# Patient Record
Sex: Female | Born: 1965 | Race: Black or African American | Hispanic: No | Smoking: Never smoker
Health system: Southern US, Community
[De-identification: ages and names within clinical notes are randomized; demographics above are authoritative.]

## PROBLEM LIST (undated history)

## (undated) DIAGNOSIS — E785 Hyperlipidemia, unspecified: Secondary | ICD-10-CM

## (undated) DIAGNOSIS — I6381 Other cerebral infarction due to occlusion or stenosis of small artery: Secondary | ICD-10-CM

## (undated) DIAGNOSIS — I639 Cerebral infarction, unspecified: Secondary | ICD-10-CM

## (undated) DIAGNOSIS — E119 Type 2 diabetes mellitus without complications: Secondary | ICD-10-CM

## (undated) HISTORY — DX: Type 2 diabetes mellitus without complications: E11.9

## (undated) HISTORY — DX: Cerebral infarction, unspecified: I63.9

## (undated) HISTORY — DX: Hyperlipidemia, unspecified: E78.5

## (undated) HISTORY — DX: Other cerebral infarction due to occlusion or stenosis of small artery: I63.81

---

## 1995-09-27 HISTORY — PX: GALLBLADDER SURGERY: SHX652

## 1999-11-28 ENCOUNTER — Emergency Department (HOSPITAL_COMMUNITY): Admission: EM | Admit: 1999-11-28 | Discharge: 1999-11-28 | Payer: Self-pay | Admitting: *Deleted

## 2006-01-28 ENCOUNTER — Emergency Department: Payer: Self-pay | Admitting: Internal Medicine

## 2006-08-07 ENCOUNTER — Emergency Department (HOSPITAL_COMMUNITY): Admission: EM | Admit: 2006-08-07 | Discharge: 2006-08-08 | Payer: Self-pay | Admitting: Emergency Medicine

## 2014-08-06 ENCOUNTER — Encounter: Payer: Self-pay | Admitting: Orthopedic Surgery

## 2014-08-26 ENCOUNTER — Encounter: Payer: Self-pay | Admitting: Orthopedic Surgery

## 2015-01-07 ENCOUNTER — Encounter: Payer: Self-pay | Admitting: *Deleted

## 2015-01-12 ENCOUNTER — Ambulatory Visit: Payer: Self-pay | Admitting: Primary Care

## 2015-02-06 ENCOUNTER — Telehealth: Payer: Self-pay | Admitting: Primary Care

## 2015-02-06 ENCOUNTER — Ambulatory Visit: Payer: Self-pay | Admitting: Primary Care

## 2015-02-06 DIAGNOSIS — Z0289 Encounter for other administrative examinations: Secondary | ICD-10-CM

## 2015-02-06 NOTE — Telephone Encounter (Signed)
Patient did not come in for their appointment today for new pt.  Please let me know if patient needs to be contacted immediately for follow up or no follow up needed. °

## 2015-02-06 NOTE — Telephone Encounter (Signed)
Yes, please follow up and reschedule.   Thanks.

## 2015-02-09 NOTE — Telephone Encounter (Signed)
Called to r/s appt, kept ringing, no message mf

## 2015-02-11 NOTE — Telephone Encounter (Signed)
L/m for pt to call back, need to r/s appt

## 2015-02-12 NOTE — Telephone Encounter (Signed)
L/m for pt to call back. mf

## 2015-03-05 ENCOUNTER — Encounter: Payer: Self-pay | Admitting: Primary Care

## 2015-03-05 ENCOUNTER — Ambulatory Visit (INDEPENDENT_AMBULATORY_CARE_PROVIDER_SITE_OTHER): Payer: 59 | Admitting: Primary Care

## 2015-03-05 VITALS — BP 110/64 | HR 110 | Temp 97.6°F | Ht 62.0 in | Wt 157.1 lb

## 2015-03-05 DIAGNOSIS — E119 Type 2 diabetes mellitus without complications: Secondary | ICD-10-CM | POA: Diagnosis not present

## 2015-03-05 DIAGNOSIS — E785 Hyperlipidemia, unspecified: Secondary | ICD-10-CM | POA: Insufficient documentation

## 2015-03-05 NOTE — Assessment & Plan Note (Signed)
Present for 10 years. Managed on Glipizide and Metformin BID but does not take regularly. Fair diet. Sugars ranging 190-240. A1C during physical next visit. Education provided regarding healthy diet.

## 2015-03-05 NOTE — Progress Notes (Signed)
Subjective:    Patient ID: Haley Gomez, female    DOB: 1966-02-27, 49 y.o.   MRN: [id number]  HPI  Haley Gomez is a 49 year old female who presents today to establish care and discuss the problems mentioned below. Her last physical was completed last summer.  1) Type 2 Diabetes: Diagnosed 10 years ago. She is managed on Metformin 1000 mg BID and Glipizide 5 mg twice daily which she doesn't take consistently (twice daily). She checks her sugars once daily at various times (before and after eating) and will range from 190's to 240's. She's never had any low numbers below 60, and never had any highs above 300.   2) Hyperlipidemia: Present for about 10 years. She is not managed on medications. Her diet consists of yogurt, boiled eggs, salad with chicken, fast food. She drinks 100 calorie soda, water, low calorie tea and lemonade. She is also doing "Thrive" which reduces her appetite. She does not regularly exercise. 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  Review of Systems  Constitutional: Negative for fatigue and unexpected weight change.  HENT: Negative for rhinorrhea.   Respiratory: Negative for cough and shortness of breath.   Cardiovascular: Negative for chest pain.  Gastrointestinal: Negative for diarrhea and constipation.  Genitourinary: Negative for dysuria and frequency.  Musculoskeletal: Negative for myalgias and arthralgias.  Skin: Negative for rash.  Allergic/Immunologic: Positive for environmental allergies.  Neurological: Negative for dizziness and headaches.  Psychiatric/Behavioral:       Denies concerns for anxiety or depression       Past Medical History  Diagnosis Date  . Diabetes mellitus without complication   . Hyperlipidemia     History   Social History  . Marital Status: Divorced    Spouse Name: N/A  . Number of Children: N/A  . Years of Education: N/A   Occupational History  . Not on  file.   Social History Main Topics  . Smoking status: Never Smoker   . Smokeless tobacco: Not on file  . Alcohol Use: 0.0 oz/week    0 Standard drinks or equivalent per week     Comment: social  . Drug Use: Not on file  . Sexual Activity: Not on file   Other Topics Concern  . Not on file   Social History Narrative   Single.   3 children.   Works as a Engineer, civil (consulting) at [employer]   Enjoys traveling, spending time with friends, exercising, reading.    Past Surgical History  Procedure Laterality Date  . Gallbladder surgery  1997  . Cesarean section      Family History  Problem Relation Age of Onset  . Diabetes Mother   . Alcohol abuse Father   . Hypertension Father   . Kidney disease Father   . Stroke Father     Allergies  Allergen Reactions  . Bactrim [Sulfamethoxazole-Trimethoprim]     No current outpatient prescriptions on file prior to visit.   No current facility-administered medications on file prior to visit.    BP 110/64 mmHg  Pulse 110  Temp(Src) 97.6 F (36.4 C) (Oral)  Ht  (1.575 m)  Wt 157 lb 1.9 oz (71.269 kg)  BMI 28.73 kg/m2  SpO2 97%    Objective:   Physical Exam  Constitutional: She is oriented to person, place, and time. She appears well-nourished.  HENT:  Right Ear: Ear canal normal.  Left Ear:  Tympanic membrane and ear canal normal.  Mouth/Throat: Oropharynx is clear and moist.  Minimal amount of fluid noted to base of right TM.   Cardiovascular: Normal rate and regular rhythm.   Pulmonary/Chest: Effort normal and breath sounds normal.  Musculoskeletal: Normal range of motion.  Neurological: She is alert and oriented to person, place, and time.  Skin: Skin is warm and dry.  Psychiatric: She has a normal mood and affect.          Assessment & Plan:

## 2015-03-05 NOTE — Progress Notes (Signed)
Pre visit review using our clinic review tool, if applicable. No additional management support is needed unless otherwise documented below in the visit note. 

## 2015-03-05 NOTE — Assessment & Plan Note (Signed)
History of for 10 years. No current meds. Fair diet. Education provided. Lipid panel next visit

## 2015-03-05 NOTE — Patient Instructions (Signed)
It is important that you improve your diet. Please limit carbohydrates in the form of white bread, rice, pasta, cakes, cookies, sugary drinks, etc. Increase your consumption of fresh fruits and vegetables. Be sure to drink plenty of water daily.  Please schedule a physical with me in the next 1-3 months. You will also schedule a lab only appointment one week prior. We will discuss your lab results during your physical.  It was a pleasure to meet you today! Please don't hesitate to call me with any questions. Welcome to [facility]!

## 2015-04-07 ENCOUNTER — Other Ambulatory Visit: Payer: Self-pay | Admitting: Gynecology

## 2015-04-07 DIAGNOSIS — R928 Other abnormal and inconclusive findings on diagnostic imaging of breast: Secondary | ICD-10-CM

## 2015-04-08 ENCOUNTER — Ambulatory Visit
Admission: RE | Admit: 2015-04-08 | Discharge: 2015-04-08 | Disposition: A | Discharge status: Home / Self Care | Payer: 59 | Source: Ambulatory Visit | Attending: Gynecology | Admitting: Gynecology

## 2015-04-08 DIAGNOSIS — R928 Other abnormal and inconclusive findings on diagnostic imaging of breast: Secondary | ICD-10-CM

## 2015-04-14 ENCOUNTER — Other Ambulatory Visit: Payer: Self-pay | Admitting: Gynecology

## 2015-05-29 ENCOUNTER — Other Ambulatory Visit: Payer: Self-pay | Admitting: Primary Care

## 2015-05-29 DIAGNOSIS — E119 Type 2 diabetes mellitus without complications: Secondary | ICD-10-CM

## 2015-05-29 DIAGNOSIS — E785 Hyperlipidemia, unspecified: Secondary | ICD-10-CM

## 2015-05-29 DIAGNOSIS — Z0000 Encounter for general adult medical examination without abnormal findings: Secondary | ICD-10-CM

## 2015-06-04 ENCOUNTER — Other Ambulatory Visit (INDEPENDENT_AMBULATORY_CARE_PROVIDER_SITE_OTHER): Payer: 59

## 2015-06-04 DIAGNOSIS — E785 Hyperlipidemia, unspecified: Secondary | ICD-10-CM | POA: Diagnosis not present

## 2015-06-04 DIAGNOSIS — E119 Type 2 diabetes mellitus without complications: Secondary | ICD-10-CM | POA: Diagnosis not present

## 2015-06-04 DIAGNOSIS — Z0000 Encounter for general adult medical examination without abnormal findings: Secondary | ICD-10-CM | POA: Diagnosis not present

## 2015-06-04 LAB — CBC
HCT: 36.6 % (ref 36.0–46.0)
Hemoglobin: 11.1 g/dL — ABNORMAL LOW (ref 12.0–15.0)
MCHC: 30.3 g/dL (ref 30.0–36.0)
MCV: 70.2 fl — ABNORMAL LOW (ref 78.0–100.0)
Platelets: 416 10*3/uL — ABNORMAL HIGH (ref 150.0–400.0)
RBC: 5.22 Mil/uL — ABNORMAL HIGH (ref 3.87–5.11)
RDW: 28.1 % — ABNORMAL HIGH (ref 11.5–15.5)
WBC: 8.3 10*3/uL (ref 4.0–10.5)

## 2015-06-04 LAB — COMPREHENSIVE METABOLIC PANEL
ALT: 14 U/L (ref 0–35)
AST: 15 U/L (ref 0–37)
Albumin: 4.1 g/dL (ref 3.5–5.2)
Alkaline Phosphatase: 121 U/L — ABNORMAL HIGH (ref 39–117)
BUN: 11 mg/dL (ref 6–23)
CO2: 31 mEq/L (ref 19–32)
Calcium: 10 mg/dL (ref 8.4–10.5)
Chloride: 98 mEq/L (ref 96–112)
Creatinine, Ser: 0.57 mg/dL (ref 0.40–1.20)
GFR: 145.55 mL/min (ref >60.00)
Glucose, Bld: 185 mg/dL — ABNORMAL HIGH (ref 70–99)
Potassium: 4.3 mEq/L (ref 3.5–5.1)
Sodium: 139 mEq/L (ref 135–145)
Total Bilirubin: 0.2 mg/dL (ref 0.2–1.2)
Total Protein: 7.4 g/dL (ref 6.0–8.3)

## 2015-06-04 LAB — LIPID PANEL
Cholesterol: 213 mg/dL — ABNORMAL HIGH (ref 0–200)
HDL: 40.1 mg/dL (ref >39.00)
LDL Cholesterol: 136 mg/dL — ABNORMAL HIGH (ref 0–99)
NonHDL: 173.31
Total CHOL/HDL Ratio: 5
Triglycerides: 188 mg/dL — ABNORMAL HIGH (ref 0.0–149.0)
VLDL: 37.6 mg/dL (ref 0.0–40.0)

## 2015-06-04 LAB — HEMOGLOBIN A1C: Hgb A1c MFr Bld: 7.9 % — ABNORMAL HIGH (ref 4.6–6.5)

## 2015-06-04 LAB — TSH: TSH: 2.63 u[IU]/mL (ref 0.35–4.50)

## 2015-06-09 ENCOUNTER — Telehealth: Payer: Self-pay | Admitting: Primary Care

## 2015-06-09 NOTE — Telephone Encounter (Signed)
I left a message for the patient to return my call.

## 2015-06-09 NOTE — Telephone Encounter (Signed)
Pt left v/m requesting cb. 

## 2015-06-09 NOTE — Telephone Encounter (Signed)
Pt would like c/b w/her lab results. Thank you.

## 2015-06-10 NOTE — Telephone Encounter (Signed)
Called and notified patient that Haley Gomez will discuss labs at upcoming CPE appointment. Patient wanted to know her A1C. I notified patient of that results.

## 2015-06-11 ENCOUNTER — Encounter: Payer: 59 | Admitting: Primary Care

## 2015-06-17 ENCOUNTER — Encounter: Payer: Self-pay | Admitting: Primary Care

## 2015-06-17 ENCOUNTER — Ambulatory Visit (INDEPENDENT_AMBULATORY_CARE_PROVIDER_SITE_OTHER): Payer: 59 | Admitting: Primary Care

## 2015-06-17 VITALS — BP 122/74 | HR 109 | Temp 98.2°F | Ht 62.0 in | Wt 164.8 lb

## 2015-06-17 DIAGNOSIS — E785 Hyperlipidemia, unspecified: Secondary | ICD-10-CM

## 2015-06-17 DIAGNOSIS — Z0000 Encounter for general adult medical examination without abnormal findings: Secondary | ICD-10-CM | POA: Insufficient documentation

## 2015-06-17 DIAGNOSIS — Z0001 Encounter for general adult medical examination with abnormal findings: Secondary | ICD-10-CM | POA: Insufficient documentation

## 2015-06-17 DIAGNOSIS — Z23 Encounter for immunization: Secondary | ICD-10-CM | POA: Diagnosis not present

## 2015-06-17 DIAGNOSIS — R232 Flushing: Secondary | ICD-10-CM | POA: Insufficient documentation

## 2015-06-17 DIAGNOSIS — E119 Type 2 diabetes mellitus without complications: Secondary | ICD-10-CM

## 2015-06-17 DIAGNOSIS — N951 Menopausal and female climacteric states: Secondary | ICD-10-CM | POA: Diagnosis not present

## 2015-06-17 MED ORDER — VENLAFAXINE HCL ER 37.5 MG PO CP24: 37.5000 mg | ORAL_CAPSULE | Freq: Every day | ORAL | Status: DC

## 2015-06-17 MED ORDER — ATORVASTATIN CALCIUM 20 MG PO TABS: 20.0000 mg | ORAL_TABLET | Freq: Every day | ORAL | Status: DC

## 2015-06-17 MED ORDER — GLIPIZIDE ER 10 MG PO TB24: 10.0000 mg | ORAL_TABLET | Freq: Every day | ORAL | Status: DC

## 2015-06-17 NOTE — Assessment & Plan Note (Signed)
Present for the past 2 years with progression in intensity since this summer. Will start low dose Effexor XR today. Will continue to monitor.

## 2015-06-17 NOTE — Progress Notes (Signed)
Pre visit review using our clinic review tool, if applicable. No additional management support is needed unless otherwise documented below in the visit note. 

## 2015-06-17 NOTE — Progress Notes (Signed)
Subjective:    Patient ID: Haley Gomez, female    DOB: 1966-03-30, 49 y.o.   MRN: [id number]  HPI  Haley Gomez is a 49 year old female who presents today for complete physical.  Immunizations: -Tetanus: Completed 2-3 years ago. -Influenza: Completed this season. -Pneumonia: Has never completed.    Diet: Endorses a healthy diet. Breakfast: High fiber oat meal with protein powder, occasional cereal, eggs with toast. Lunch: Soup, salads, sandwiches Dinner: Chicken, steak, vegetables, salads Desserts: Occasional  Snacks: Occasional. Beverages: Mostly water Exercise: She started walking 3-4 miles daily 5 days a week.  Wt Readings from Last 3 Encounters:  06/17/15 164 lb 12.8 oz (74.753 kg)  03/05/15 157 lb 1.9 oz (71.269 kg)     Eye exam: Completed 2 years ago. Is due. Dental exam: Completes semiannually. Pap Smear: Completed in June 2016 with GYN Mammogram: Completed in June 2016  Haley Gomez also presents today with a chief complaint of hot flashes.  1) Hot flashes: Intermittently for the past 2 years. She's having to remove her bed sheets at night frequently. Irregular periods since December 2015. Last period this was in May 2016. She feels as though her body is attempting to adjust to different environmental temperatures. She will "pour sweat" throughout the day. She's never been treated for this in the past.  Review of Systems  Constitutional: Negative for unexpected weight change.  HENT: Negative for rhinorrhea.   Respiratory: Negative for cough and shortness of breath.   Cardiovascular: Negative for chest pain.  Gastrointestinal: Negative for diarrhea and constipation.  Endocrine: Positive for heat intolerance.  Genitourinary: Negative for difficulty urinating.  Musculoskeletal: Negative for myalgias and arthralgias.  Skin: Negative for rash.  Neurological: Negative for dizziness, numbness and headaches.  Psychiatric/Behavioral:       Denies concerns for  anxiety and depression.       Past Medical History  Diagnosis Date  . Diabetes mellitus without complication   . Hyperlipidemia     Social History   Social History  . Marital Status: Divorced    Spouse Name: N/A  . Number of Children: N/A  . Years of Education: N/A   Occupational History  . Not on file.   Social History Main Topics  . Smoking status: Never Smoker   . Smokeless tobacco: Not on file  . Alcohol Use: 0.0 oz/week    0 Standard drinks or equivalent per week     Comment: social  . Drug Use: Not on file  . Sexual Activity: Not on file   Other Topics Concern  . Not on file   Social History Narrative   Single.   3 children.   Works as a Engineer, civil (consulting) at [employer]   Enjoys traveling, spending time with friends, exercising, reading.    Past Surgical History  Procedure Laterality Date  . Gallbladder surgery  1997  . Cesarean section      Family History  Problem Relation Age of Onset  . Diabetes Mother   . Alcohol abuse Father   . Hypertension Father   . Kidney disease Father   . Stroke Father     Allergies  Allergen Reactions  . Bactrim [Sulfamethoxazole-Trimethoprim]     Current Outpatient Prescriptions on File Prior to Visit  Medication Sig Dispense Refill  . metFORMIN (GLUCOPHAGE) 1000 MG tablet Take 1,000 mg by mouth 2 (two) times daily.  6   No current facility-administered medications on file prior to visit.    BP 122/74 mmHg  Pulse 109  Temp(Src) 98.2 F (36.8 C) (Oral)  Ht  (1.575 m)  Wt 164 lb 12.8 oz (74.753 kg)  BMI 30.13 kg/m2  SpO2 98%    Objective:   Physical Exam  Constitutional: She is oriented to person, place, and time. She appears well-nourished.  HENT:  Right Ear: Tympanic membrane and ear canal normal.  Left Ear: Tympanic membrane and ear canal normal.  Nose: Nose normal.  Mouth/Throat: Oropharynx is clear and moist.  Eyes: Conjunctivae and EOM are normal. Pupils are equal, round, and reactive to light.  Neck:  Neck supple. No thyromegaly present.  Cardiovascular: Normal rate and regular rhythm.   Pulmonary/Chest: Effort normal and breath sounds normal.  Abdominal: Soft. Bowel sounds are normal. There is no tenderness.  Musculoskeletal: Normal range of motion.  Lymphadenopathy:    She has no cervical adenopathy.  Neurological: She is alert and oriented to person, place, and time. She has normal reflexes. No cranial nerve deficit.  Skin: Skin is warm and dry.  Psychiatric: She has a normal mood and affect.          Assessment & Plan:

## 2015-06-17 NOTE — Assessment & Plan Note (Signed)
Tetanus and flu UTD, prevnar 13 provided today. Pap and mammogram UTD. Recommended annual diabetic eye exam. Discussed the importance of healthy diet and exercise. Labs with hyperlipidemia and increased A1C. Exam unremarkable. Repeat physical in 1 year.

## 2015-06-17 NOTE — Patient Instructions (Signed)
Start Glipizide XL 10 mg tablets for diabetes. Take 1 tablet by mouth once daily in the morning. Continue Metformin.  Schedule a lab appointment for a urine microalbumin test at your convenience.  Start atorvastatin (lipitor) 20 mg tablets for high cholesterol. Take 1 tablet by mouth daily.  Start venlafaxine (effexor) ER for hot flashes. Take 1 tablet by mouth daily.  You've been provided with a pneumonia vaccination.   Please schedule a follow up appointment in 3 months for re-evaluation of diabetes.  It was a pleasure to see you today!

## 2015-06-17 NOTE — Assessment & Plan Note (Signed)
LDL, TC, and Trigs above goal. She's worked to improve her diet over the past several months. Due to DM type 2 will initiate lipitor 20 mg daily. LFT's WNL. Discussed possible side effects. Will repeat in 6 months to 1 year.

## 2015-06-17 NOTE — Assessment & Plan Note (Signed)
A1C of 7.9 which is worse than prior A1C  Currently managed on Metformin 1000 mg BID and Glipizide 5 mg BID. Endorses fair diet with daily exercise. Change to Glipizide XL 10 mg daily, continue metformin. Foot exam completed today. Initiated statin due to hyperlipidemia. Urine microalbumin pending. Follow up in 3 months.

## 2015-07-17 ENCOUNTER — Telehealth: Payer: Self-pay | Admitting: Primary Care

## 2015-07-17 NOTE — Telephone Encounter (Signed)
Tried to call patient but could not leave message due mailbox full.

## 2015-07-17 NOTE — Telephone Encounter (Signed)
Will you please ask Haley Gomez how she's doing since starting the Effexor for hot flashes? Thanks.

## 2015-07-20 NOTE — Telephone Encounter (Signed)
Tried to call patient but could not leave message due mailbox full. 

## 2015-07-20 NOTE — Telephone Encounter (Signed)
Message left for patient to return my call.  

## 2015-07-20 NOTE — Telephone Encounter (Signed)
Patient returned Chan's call.  Patient can be reached at [phone].

## 2015-07-21 NOTE — Telephone Encounter (Signed)
Sending letter asking patient regarding Kate's comments.

## 2015-08-13 ENCOUNTER — Other Ambulatory Visit: Payer: Self-pay | Admitting: Gynecology

## 2015-08-13 DIAGNOSIS — N63 Unspecified lump in unspecified breast: Secondary | ICD-10-CM

## 2015-09-18 ENCOUNTER — Other Ambulatory Visit: Payer: 59

## 2015-09-25 ENCOUNTER — Ambulatory Visit: Payer: 59 | Admitting: Primary Care

## 2015-09-25 ENCOUNTER — Other Ambulatory Visit: Payer: 59

## 2015-10-02 ENCOUNTER — Telehealth: Payer: Self-pay | Admitting: Primary Care

## 2015-10-02 ENCOUNTER — Ambulatory Visit: Payer: 59 | Admitting: Primary Care

## 2015-10-02 NOTE — Telephone Encounter (Signed)
Pt did not come in for their appt today for follow up. Please let me know if pt needs to be contacted immediately for follow up or no follow up needed. Best phone number to contact pt is [phone].

## 2015-10-02 NOTE — Telephone Encounter (Signed)
Needs follow up soon, please schedule at her convenience. Please do not charge no show fee. Thanks.

## 2015-10-05 NOTE — Telephone Encounter (Signed)
Cancelled appt per Jae DireKate, no charge.  Left message for pt to call back and reschedule appt.

## 2016-04-07 ENCOUNTER — Other Ambulatory Visit: Payer: Self-pay | Admitting: Primary Care

## 2018-02-10 ENCOUNTER — Emergency Department (HOSPITAL_COMMUNITY): Payer: BLUE CROSS/BLUE SHIELD

## 2018-02-10 ENCOUNTER — Encounter (HOSPITAL_COMMUNITY): Payer: Self-pay | Admitting: Emergency Medicine

## 2018-02-10 ENCOUNTER — Emergency Department (HOSPITAL_COMMUNITY)
Admission: EM | Admit: 2018-02-10 | Discharge: 2018-02-11 | Disposition: A | Discharge status: Home / Self Care | Payer: BLUE CROSS/BLUE SHIELD | Attending: Emergency Medicine | Admitting: Emergency Medicine

## 2018-02-10 DIAGNOSIS — R1110 Vomiting, unspecified: Secondary | ICD-10-CM | POA: Insufficient documentation

## 2018-02-10 DIAGNOSIS — E1165 Type 2 diabetes mellitus with hyperglycemia: Secondary | ICD-10-CM | POA: Insufficient documentation

## 2018-02-10 DIAGNOSIS — R42 Dizziness and giddiness: Secondary | ICD-10-CM | POA: Diagnosis present

## 2018-02-10 DIAGNOSIS — R739 Hyperglycemia, unspecified: Secondary | ICD-10-CM

## 2018-02-10 DIAGNOSIS — R202 Paresthesia of skin: Secondary | ICD-10-CM | POA: Insufficient documentation

## 2018-02-10 DIAGNOSIS — Z79899 Other long term (current) drug therapy: Secondary | ICD-10-CM | POA: Diagnosis not present

## 2018-02-10 LAB — BASIC METABOLIC PANEL
Anion gap: 14 (ref 5–15)
BUN: 10 mg/dL (ref 6–20)
CO2: 21 mmol/L — ABNORMAL LOW (ref 22–32)
Calcium: 9.2 mg/dL (ref 8.9–10.3)
Chloride: 99 mmol/L — ABNORMAL LOW (ref 101–111)
Creatinine, Ser: 0.51 mg/dL (ref 0.44–1.00)
GFR calc Af Amer: >60 mL/min (ref >60)
GFR calc non Af Amer: >60 mL/min (ref >60)
Glucose, Bld: 332 mg/dL — ABNORMAL HIGH (ref 65–99)
Potassium: 3.9 mmol/L (ref 3.5–5.1)
Sodium: 134 mmol/L — ABNORMAL LOW (ref 135–145)

## 2018-02-10 LAB — I-STAT BETA HCG BLOOD, ED (MC, WL, AP ONLY): I-stat hCG, quantitative: <5 m[IU]/mL (ref <5)

## 2018-02-10 LAB — CBC
HCT: 37.3 % (ref 36.0–46.0)
Hemoglobin: 12.3 g/dL (ref 12.0–15.0)
MCH: 25.9 pg — ABNORMAL LOW (ref 26.0–34.0)
MCHC: 33 g/dL (ref 30.0–36.0)
MCV: 78.7 fL (ref 78.0–100.0)
Platelets: 313 10*3/uL (ref 150–400)
RBC: 4.74 MIL/uL (ref 3.87–5.11)
RDW: 14.3 % (ref 11.5–15.5)
WBC: 14.3 10*3/uL — ABNORMAL HIGH (ref 4.0–10.5)

## 2018-02-10 LAB — I-STAT TROPONIN, ED: Troponin i, poc: 0 ng/mL (ref 0.00–0.08)

## 2018-02-10 LAB — CBG MONITORING, ED
Glucose-Capillary: 292 mg/dL — ABNORMAL HIGH (ref 65–99)
Glucose-Capillary: 301 mg/dL — ABNORMAL HIGH (ref 65–99)

## 2018-02-10 MED ORDER — GADOBENATE DIMEGLUMINE 529 MG/ML IV SOLN
20.0000 mL | Freq: Once | INTRAVENOUS | Status: AC | PRN
Start: 2018-02-10 — End: 2018-02-10
  Administered 2018-02-10: 16 mL via INTRAVENOUS

## 2018-02-10 MED ORDER — MECLIZINE HCL 25 MG PO TABS
50.0000 mg | ORAL_TABLET | Freq: Once | ORAL | Status: AC
  Administered 2018-02-10: 50 mg via ORAL
  Filled 2018-02-10: qty 2

## 2018-02-10 MED ORDER — SODIUM CHLORIDE 0.9 % IV BOLUS
1000.0000 mL | Freq: Once | INTRAVENOUS | Status: AC
  Administered 2018-02-10: 1000 mL via INTRAVENOUS

## 2018-02-10 MED ORDER — MECLIZINE HCL 25 MG PO TABS: 25.0000 mg | ORAL_TABLET | Freq: Three times a day (TID) | ORAL | 0 refills | Status: DC | PRN

## 2018-02-10 NOTE — ED Notes (Addendum)
Pt. Made aware for the need of urine specimen. 

## 2018-02-10 NOTE — ED Provider Notes (Signed)
[facility] Provider Note   CSN: [id number] Arrival date & time: 02/10/18  1841     History   Chief Complaint Chief Complaint  Patient presents with  . facial numbness  . Dizziness    HPI Haley Gomez is a 52 y.o. female.  HPI  52 year old female presents with facial numbness as well as dizziness.  She states that around 1 PM she started feeling numbness to the top part of her lip bilaterally.  Then after couple minutes this proceeded to go into her left cheek and she is been noticing some tingling in her fingertips on the left side.  No weakness or slurred speech.  No headache.  Shortly after this she is developed dizziness to the point that whenever she tries to stand up or get up she is feeling very off balance and has a hard time walking.  Twice she has vomited because of the severity of dizziness.  Feels like things are spinning.  There is no blurry vision, chest pain, or shortness of breath.  Past Medical History:  Diagnosis Date  . Diabetes mellitus without complication (HCC)   . Hyperlipidemia     Patient Active Problem List   Diagnosis Date Noted  . Hot flashes 06/17/2015  . Preventative health care 06/17/2015  . Type 2 diabetes mellitus (HCC) 03/05/2015  . Hyperlipidemia 03/05/2015    Past Surgical History:  Procedure Laterality Date  . CESAREAN SECTION    . GALLBLADDER SURGERY  1997     OB History   None      Home Medications    Prior to Admission medications   Medication Sig Start Date End Date Taking? Authorizing Provider  atorvastatin (LIPITOR) 20 MG tablet Take 1 tablet (20 mg total) by mouth daily. 06/17/15  Yes Doreene Nest, NP  diphenhydrAMINE (BENADRYL) 25 MG tablet Take 25 mg by mouth every 6 (six) hours as needed for sleep.   Yes [provider]  glipiZIDE (GLUCOTROL XL) 10 MG 24 hr tablet TAKE 1 TABLET BY MOUTH EVERY DAY WITH BREAKFAST   NEED OFFICE VISIT FOR FURTHER REFILLS 04/07/16  Yes  Doreene Nest, NP  metFORMIN (GLUCOPHAGE) 1000 MG tablet Take 1,000 mg by mouth 2 (two) times daily. 01/16/15  Yes [provider]  meclizine (ANTIVERT) 25 MG tablet Take 1 tablet (25 mg total) by mouth 3 (three) times daily as needed for dizziness. 02/10/18   Pricilla Loveless, MD  venlafaxine XR (EFFEXOR XR) 37.5 MG 24 hr capsule Take 1 capsule (37.5 mg total) by mouth daily with breakfast. Patient not taking: Reported on 02/10/2018 06/17/15   Doreene Nest, NP    Family History Family History  Problem Relation Age of Onset  . Diabetes Mother   . Alcohol abuse Father   . Hypertension Father   . Kidney disease Father   . Stroke Father     Social History Social History   Tobacco Use  . Smoking status: Never Smoker  . Smokeless tobacco: Never Used  Substance Use Topics  . Alcohol use: Yes    Alcohol/week: 0.0 oz    Comment: social  . Drug use: Not on file     Allergies   Bactrim [sulfamethoxazole-trimethoprim]   Review of Systems Review of Systems  Eyes: Negative for visual disturbance.  Respiratory: Negative for shortness of breath.   Cardiovascular: Negative for chest pain.  Gastrointestinal: Positive for vomiting.  Neurological: Positive for dizziness and numbness. Negative for speech difficulty, weakness  and headaches.  All other systems reviewed and are negative.    Physical Exam Updated Vital Signs BP 120/64   Pulse 85   Temp 97.9 F (36.6 C)   Resp 18   Ht 5\' 2"  (1.575 m)   Wt 74.8 kg (165 lb)   SpO2 99%   BMI 30.18 kg/m   Physical Exam  Constitutional: She is oriented to person, place, and time. She appears well-developed and well-nourished. No distress.  HENT:  Head: Normocephalic and atraumatic.  Right Ear: External ear normal.  Left Ear: External ear normal.  Nose: Nose normal.  Eyes: Pupils are equal, round, and reactive to light. EOM are normal. Right eye exhibits no discharge. Left eye exhibits no discharge.  Cardiovascular:  Normal rate, regular rhythm and normal heart sounds.  Pulmonary/Chest: Effort normal and breath sounds normal.  Abdominal: Soft. There is no tenderness.  Neurological: She is alert and oriented to person, place, and time.  CN 3-12 grossly intact. 5/5 strength in all 4 extremities. Grossly normal sensation. Normal finger to nose. Able to walk in straight line, feels dizzy but does not appear ataxic.  Skin: Skin is warm and dry. She is not diaphoretic.  Nursing note and vitals reviewed.    ED Treatments / Results  Labs (all labs ordered are listed, but only abnormal results are displayed) Labs Reviewed  BASIC METABOLIC PANEL - Abnormal; Notable for the following components:      Result Value   Sodium 134 (*)    Chloride 99 (*)    CO2 21 (*)    Glucose, Bld 332 (*)    All other components within normal limits  CBC - Abnormal; Notable for the following components:   WBC 14.3 (*)    MCH 25.9 (*)    All other components within normal limits  CBG MONITORING, ED - Abnormal; Notable for the following components:   Glucose-Capillary 292 (*)    All other components within normal limits  CBG MONITORING, ED - Abnormal; Notable for the following components:   Glucose-Capillary 301 (*)    All other components within normal limits  URINALYSIS, ROUTINE W REFLEX MICROSCOPIC  RAPID URINE DRUG SCREEN, HOSP PERFORMED  I-STAT BETA HCG BLOOD, ED ([facility], [facility], [facility] ONLY)  I-STAT TROPONIN, ED    EKG EKG Interpretation  Date/Time:  Saturday Feb 10 2018 18:50:01 EDT Ventricular Rate:  87 PR Interval:    QRS Duration: 66 QT Interval:  368 QTC Calculation: 443 R Axis:   47 Text Interpretation:  Normal sinus rhythm Low voltage, precordial leads Baseline wander in lead(s) III V1 no significant change since 2001 Confirmed by Pricilla Loveless [phone]) on 02/10/2018 7:52:21 PM   Radiology Ct Head Wo Contrast  Result Date: 02/10/2018 CLINICAL DATA:  Ataxia EXAM: CT HEAD WITHOUT CONTRAST TECHNIQUE: Contiguous  axial images were obtained from the base of the skull through the vertex without intravenous contrast. COMPARISON:  None. FINDINGS: Brain: No acute intracranial abnormality. Specifically, no hemorrhage, hydrocephalus, mass lesion, acute infarction, or significant intracranial injury. Vascular: No hyperdense vessel or unexpected calcification. Skull: No acute calvarial abnormality. Sinuses/Orbits: Visualized paranasal sinuses and mastoids clear. Orbital soft tissues unremarkable. Other: None IMPRESSION: No acute intracranial abnormality. Electronically Signed   By: Charlett Nose M.D.   On: 02/10/2018 20:26   Mr Angiogram Head Wo Contrast  Result Date: 02/10/2018 CLINICAL DATA:  Initial evaluation for acute dizziness, left-sided facial numbness. EXAM: MRI HEAD WITHOUT CONTRAST MRA HEAD WITHOUT CONTRAST MRA NECK WITHOUT AND WITH CONTRAST  TECHNIQUE: Multiplanar, multiecho pulse sequences of the brain and surrounding structures were obtained without intravenous contrast. Angiographic images of the Circle of Willis were obtained using MRA technique without intravenous contrast. Angiographic images of the neck were obtained using MRA technique without and with intravenous contrast. Carotid stenosis measurements (when applicable) are obtained utilizing NASCET criteria, using the distal internal carotid diameter as the denominator. CONTRAST:  16mL MULTIHANCE GADOBENATE DIMEGLUMINE 529 MG/ML IV SOLN COMPARISON:  Prior CT from earlier the same day. FINDINGS: MRI HEAD FINDINGS Cerebral volume within normal limits. No significant cerebral white matter change for age. No definite abnormal foci of restricted diffusion to suggest acute or subacute ischemia. Minimal vague diffusion abnormality overlying the brainstem on axial DWI sequence image 12 favored to be artifactual in nature. Gray-white matter differentiation well maintained. No encephalomalacia to suggest chronic infarction. No foci of susceptibility artifact to suggest  acute or chronic intracranial hemorrhage. No mass lesion, midline shift or mass effect. Ventricles normal in size without hydrocephalus. No extra-axial fluid collection. Major dural sinuses are grossly patent. Pituitary gland suprasellar region normal. Midline structures intact and normal. Major intracranial vascular flow voids are maintained. Craniocervical junction normal. Upper cervical spine normal. Bone marrow signal intensity within normal limits. No scalp soft tissue abnormality. Globes and orbital soft tissues within normal limits. Paranasal sinuses are clear. Trace opacity left mastoid air cells, of doubtful significance. Mastoid air cells otherwise clear. Inner ear structures normal. MRA HEAD FINDINGS ANTERIOR CIRCULATION: Distal cervical segments of the internal carotid arteries are widely patent. Petrous, cavernous, and supraclinoid segments widely patent bilaterally. ICA termini widely patent. A1 segments, anterior communicating artery common anterior cerebral arteries widely patent to their distal aspects. M1 segments widely patent bilaterally. Normal MCA bifurcations. No proximal M2 occlusion. Distal MCA branches well perfused and symmetric. POSTERIOR CIRCULATION: Vertebral arteries widely patent to the vertebrobasilar junction. Partially visualized posterior inferior cerebral arteries patent bilaterally. Basilar artery patent to its distal aspect without stenosis. Superior cerebral arteries patent bilaterally. Both of the PCAs primarily supplied via the basilar. Mild-to-moderate narrowing of the proximal and mid right P2 segment (series 602, image 6). PCAs otherwise mildly irregular but patent to their distal aspects without stenosis. No aneurysm. MRA NECK FINDINGS Source images reviewed. Examination technically limited by motion artifact. Visualized aortic arch of normal caliber with normal branch pattern. No flow-limiting stenosis about the origin of the great vessels. Visualized subclavian  arteries patent. Right common carotid artery widely patent from its origin to the bifurcation. Mild approximate 25% at the origin of the right ICA. Right ICA otherwise patent distally to the skull base without stenosis or occlusion. Left common carotid artery patent from its origin to the bifurcation without stenosis. No significant narrowing about the left bifurcation. Left ICA patent distally to the skull base without stenosis or occlusion. Evaluation of the vertebral artery somewhat limited due to adjacent venous contamination. Vertebral arteries both arise from the subclavian arteries. Vertebral arteries are grossly patent within the neck without stenosis or occlusion. Left vertebral artery appears to be slightly dominant. IMPRESSION: MRI HEAD IMPRESSION Negative brain MRI for age. No acute intracranial abnormality identified. MRA HEAD IMPRESSION 1. Negative intracranial MRA with no large vessel occlusion or proximal high-grade/correctable stenosis identified. 2. Mild to moderate multifocal right P2 stenoses. MRA NECK IMPRESSION 1. Negative MRA of the neck for high-grade or critical flow limiting stenosis. 2. Mild narrowing of approximately 25% at the origin of the right ICA. 3. Widely patent left carotid artery system without flow-limiting stenosis.  4. Widely patent vertebral arteries within the neck. Electronically Signed   By: Rise Mu M.D.   On: 02/10/2018 23:36   Mr Maxine Glenn Neck W Wo Contrast  Result Date: 02/10/2018 CLINICAL DATA:  Initial evaluation for acute dizziness, left-sided facial numbness. EXAM: MRI HEAD WITHOUT CONTRAST MRA HEAD WITHOUT CONTRAST MRA NECK WITHOUT AND WITH CONTRAST TECHNIQUE: Multiplanar, multiecho pulse sequences of the brain and surrounding structures were obtained without intravenous contrast. Angiographic images of the Circle of Willis were obtained using MRA technique without intravenous contrast. Angiographic images of the neck were obtained using MRA technique  without and with intravenous contrast. Carotid stenosis measurements (when applicable) are obtained utilizing NASCET criteria, using the distal internal carotid diameter as the denominator. CONTRAST:  16mL MULTIHANCE GADOBENATE DIMEGLUMINE 529 MG/ML IV SOLN COMPARISON:  Prior CT from earlier the same day. FINDINGS: MRI HEAD FINDINGS Cerebral volume within normal limits. No significant cerebral white matter change for age. No definite abnormal foci of restricted diffusion to suggest acute or subacute ischemia. Minimal vague diffusion abnormality overlying the brainstem on axial DWI sequence image 12 favored to be artifactual in nature. Gray-white matter differentiation well maintained. No encephalomalacia to suggest chronic infarction. No foci of susceptibility artifact to suggest acute or chronic intracranial hemorrhage. No mass lesion, midline shift or mass effect. Ventricles normal in size without hydrocephalus. No extra-axial fluid collection. Major dural sinuses are grossly patent. Pituitary gland suprasellar region normal. Midline structures intact and normal. Major intracranial vascular flow voids are maintained. Craniocervical junction normal. Upper cervical spine normal. Bone marrow signal intensity within normal limits. No scalp soft tissue abnormality. Globes and orbital soft tissues within normal limits. Paranasal sinuses are clear. Trace opacity left mastoid air cells, of doubtful significance. Mastoid air cells otherwise clear. Inner ear structures normal. MRA HEAD FINDINGS ANTERIOR CIRCULATION: Distal cervical segments of the internal carotid arteries are widely patent. Petrous, cavernous, and supraclinoid segments widely patent bilaterally. ICA termini widely patent. A1 segments, anterior communicating artery common anterior cerebral arteries widely patent to their distal aspects. M1 segments widely patent bilaterally. Normal MCA bifurcations. No proximal M2 occlusion. Distal MCA branches well  perfused and symmetric. POSTERIOR CIRCULATION: Vertebral arteries widely patent to the vertebrobasilar junction. Partially visualized posterior inferior cerebral arteries patent bilaterally. Basilar artery patent to its distal aspect without stenosis. Superior cerebral arteries patent bilaterally. Both of the PCAs primarily supplied via the basilar. Mild-to-moderate narrowing of the proximal and mid right P2 segment (series 602, image 6). PCAs otherwise mildly irregular but patent to their distal aspects without stenosis. No aneurysm. MRA NECK FINDINGS Source images reviewed. Examination technically limited by motion artifact. Visualized aortic arch of normal caliber with normal branch pattern. No flow-limiting stenosis about the origin of the great vessels. Visualized subclavian arteries patent. Right common carotid artery widely patent from its origin to the bifurcation. Mild approximate 25% at the origin of the right ICA. Right ICA otherwise patent distally to the skull base without stenosis or occlusion. Left common carotid artery patent from its origin to the bifurcation without stenosis. No significant narrowing about the left bifurcation. Left ICA patent distally to the skull base without stenosis or occlusion. Evaluation of the vertebral artery somewhat limited due to adjacent venous contamination. Vertebral arteries both arise from the subclavian arteries. Vertebral arteries are grossly patent within the neck without stenosis or occlusion. Left vertebral artery appears to be slightly dominant. IMPRESSION: MRI HEAD IMPRESSION Negative brain MRI for age. No acute intracranial abnormality identified. MRA HEAD IMPRESSION 1.  Negative intracranial MRA with no large vessel occlusion or proximal high-grade/correctable stenosis identified. 2. Mild to moderate multifocal right P2 stenoses. MRA NECK IMPRESSION 1. Negative MRA of the neck for high-grade or critical flow limiting stenosis. 2. Mild narrowing of  approximately 25% at the origin of the right ICA. 3. Widely patent left carotid artery system without flow-limiting stenosis. 4. Widely patent vertebral arteries within the neck. Electronically Signed   By: Rise Mu M.D.   On: 02/10/2018 23:36   Mr Brain Wo Contrast  Result Date: 02/10/2018 CLINICAL DATA:  Initial evaluation for acute dizziness, left-sided facial numbness. EXAM: MRI HEAD WITHOUT CONTRAST MRA HEAD WITHOUT CONTRAST MRA NECK WITHOUT AND WITH CONTRAST TECHNIQUE: Multiplanar, multiecho pulse sequences of the brain and surrounding structures were obtained without intravenous contrast. Angiographic images of the Circle of Willis were obtained using MRA technique without intravenous contrast. Angiographic images of the neck were obtained using MRA technique without and with intravenous contrast. Carotid stenosis measurements (when applicable) are obtained utilizing NASCET criteria, using the distal internal carotid diameter as the denominator. CONTRAST:  16mL MULTIHANCE GADOBENATE DIMEGLUMINE 529 MG/ML IV SOLN COMPARISON:  Prior CT from earlier the same day. FINDINGS: MRI HEAD FINDINGS Cerebral volume within normal limits. No significant cerebral white matter change for age. No definite abnormal foci of restricted diffusion to suggest acute or subacute ischemia. Minimal vague diffusion abnormality overlying the brainstem on axial DWI sequence image 12 favored to be artifactual in nature. Gray-white matter differentiation well maintained. No encephalomalacia to suggest chronic infarction. No foci of susceptibility artifact to suggest acute or chronic intracranial hemorrhage. No mass lesion, midline shift or mass effect. Ventricles normal in size without hydrocephalus. No extra-axial fluid collection. Major dural sinuses are grossly patent. Pituitary gland suprasellar region normal. Midline structures intact and normal. Major intracranial vascular flow voids are maintained. Craniocervical  junction normal. Upper cervical spine normal. Bone marrow signal intensity within normal limits. No scalp soft tissue abnormality. Globes and orbital soft tissues within normal limits. Paranasal sinuses are clear. Trace opacity left mastoid air cells, of doubtful significance. Mastoid air cells otherwise clear. Inner ear structures normal. MRA HEAD FINDINGS ANTERIOR CIRCULATION: Distal cervical segments of the internal carotid arteries are widely patent. Petrous, cavernous, and supraclinoid segments widely patent bilaterally. ICA termini widely patent. A1 segments, anterior communicating artery common anterior cerebral arteries widely patent to their distal aspects. M1 segments widely patent bilaterally. Normal MCA bifurcations. No proximal M2 occlusion. Distal MCA branches well perfused and symmetric. POSTERIOR CIRCULATION: Vertebral arteries widely patent to the vertebrobasilar junction. Partially visualized posterior inferior cerebral arteries patent bilaterally. Basilar artery patent to its distal aspect without stenosis. Superior cerebral arteries patent bilaterally. Both of the PCAs primarily supplied via the basilar. Mild-to-moderate narrowing of the proximal and mid right P2 segment (series 602, image 6). PCAs otherwise mildly irregular but patent to their distal aspects without stenosis. No aneurysm. MRA NECK FINDINGS Source images reviewed. Examination technically limited by motion artifact. Visualized aortic arch of normal caliber with normal branch pattern. No flow-limiting stenosis about the origin of the great vessels. Visualized subclavian arteries patent. Right common carotid artery widely patent from its origin to the bifurcation. Mild approximate 25% at the origin of the right ICA. Right ICA otherwise patent distally to the skull base without stenosis or occlusion. Left common carotid artery patent from its origin to the bifurcation without stenosis. No significant narrowing about the left  bifurcation. Left ICA patent distally to the skull base without stenosis or  occlusion. Evaluation of the vertebral artery somewhat limited due to adjacent venous contamination. Vertebral arteries both arise from the subclavian arteries. Vertebral arteries are grossly patent within the neck without stenosis or occlusion. Left vertebral artery appears to be slightly dominant. IMPRESSION: MRI HEAD IMPRESSION Negative brain MRI for age. No acute intracranial abnormality identified. MRA HEAD IMPRESSION 1. Negative intracranial MRA with no large vessel occlusion or proximal high-grade/correctable stenosis identified. 2. Mild to moderate multifocal right P2 stenoses. MRA NECK IMPRESSION 1. Negative MRA of the neck for high-grade or critical flow limiting stenosis. 2. Mild narrowing of approximately 25% at the origin of the right ICA. 3. Widely patent left carotid artery system without flow-limiting stenosis. 4. Widely patent vertebral arteries within the neck. Electronically Signed   By: Rise Mu M.D.   On: 02/10/2018 23:36    Procedures Procedures (including critical care time)  Medications Ordered in ED Medications  sodium chloride 0.9 % bolus 1,000 mL (0 mLs Intravenous Stopped 02/10/18 2310)  meclizine (ANTIVERT) tablet 50 mg (50 mg Oral Given 02/10/18 2053)  gadobenate dimeglumine (MULTIHANCE) injection 20 mL (16 mLs Intravenous Contrast Given 02/10/18 2206)     Initial Impression / Assessment and Plan / ED Course  I have reviewed the triage vital signs and the nursing notes.  Pertinent labs & imaging results that were available during my care of the patient were reviewed by me and considered in my medical decision making (see chart for details).     The patient's dizziness is probably from vertigo.  She is had continuous dizziness since onset but she is able to ambulate without significant difficulty.  She is feeling much better with Antivert.  Given her history of diabetes as well as  having the paresthesias, MRI obtained.  However this MRI is negative.  The paresthesias are odd as it crosses the midline above her lip and is more of a tingling than a true numbness.  Thus I think stroke or TIA is unlikely.  She feels much better with Antivert and appears stable for discharge home.  We discussed that her hyperglycemia needs outpatient management with her PCP.  Anion gap is 14, not consistent with DKA.  She was given fluids.  Discharge home with return precautions.  Final Clinical Impressions(s) / ED Diagnoses   Final diagnoses:  Vertigo  Paresthesia  Hyperglycemia    ED Discharge Orders        Ordered    meclizine (ANTIVERT) 25 MG tablet  3 times daily PRN     02/10/18 2344       Pricilla Loveless, MD 02/10/18 2348

## 2018-02-10 NOTE — Discharge Instructions (Addendum)
Follow-up closely with your primary care physician for follow-up of your visit today as well as your elevated blood glucose.  If you develop headache, weakness in your arms or legs, or new numbness, return to the ER for evaluation.  Otherwise follow-up closely with your doctor.

## 2018-02-10 NOTE — ED Notes (Signed)
Pt. Made aware for the need of urine specimen. 

## 2018-02-10 NOTE — ED Triage Notes (Signed)
Pt reports around 1pm today at work she got numbness across her top lip then spread to left side of face. Pt reports her left arm felt tingling as well.  Pt felt dizzy and nauseated. Reports on Thursday she had a severe left sided head pain that last maybe a minute and went away. Pt has no neuro deficits in triage at this time. Pt reports she vomit before she left the house

## 2018-12-19 ENCOUNTER — Encounter (HOSPITAL_COMMUNITY): Payer: Self-pay | Admitting: Emergency Medicine

## 2018-12-19 ENCOUNTER — Emergency Department (HOSPITAL_COMMUNITY): Payer: Managed Care, Other (non HMO)

## 2018-12-19 ENCOUNTER — Encounter (HOSPITAL_COMMUNITY): Payer: Self-pay

## 2018-12-19 ENCOUNTER — Inpatient Hospital Stay (HOSPITAL_COMMUNITY)
Admission: EM | Admit: 2018-12-19 | Discharge: 2018-12-21 | DRG: 066 | Disposition: A | Discharge status: Home / Self Care | Payer: Managed Care, Other (non HMO) | Attending: Internal Medicine | Admitting: Internal Medicine

## 2018-12-19 ENCOUNTER — Ambulatory Visit (HOSPITAL_COMMUNITY)
Admission: EM | Admit: 2018-12-19 | Discharge: 2018-12-19 | Disposition: A | Discharge status: Home / Self Care | Payer: Managed Care, Other (non HMO) | Source: Home / Self Care | Attending: Family Medicine | Admitting: Family Medicine

## 2018-12-19 ENCOUNTER — Other Ambulatory Visit: Payer: Self-pay

## 2018-12-19 DIAGNOSIS — E1159 Type 2 diabetes mellitus with other circulatory complications: Secondary | ICD-10-CM

## 2018-12-19 DIAGNOSIS — Z8249 Family history of ischemic heart disease and other diseases of the circulatory system: Secondary | ICD-10-CM | POA: Diagnosis not present

## 2018-12-19 DIAGNOSIS — R29701 NIHSS score 1: Secondary | ICD-10-CM | POA: Diagnosis present

## 2018-12-19 DIAGNOSIS — Z833 Family history of diabetes mellitus: Secondary | ICD-10-CM

## 2018-12-19 DIAGNOSIS — E785 Hyperlipidemia, unspecified: Secondary | ICD-10-CM | POA: Diagnosis present

## 2018-12-19 DIAGNOSIS — E1165 Type 2 diabetes mellitus with hyperglycemia: Secondary | ICD-10-CM | POA: Diagnosis present

## 2018-12-19 DIAGNOSIS — E1151 Type 2 diabetes mellitus with diabetic peripheral angiopathy without gangrene: Secondary | ICD-10-CM | POA: Diagnosis present

## 2018-12-19 DIAGNOSIS — Z79899 Other long term (current) drug therapy: Secondary | ICD-10-CM | POA: Diagnosis not present

## 2018-12-19 DIAGNOSIS — R200 Anesthesia of skin: Secondary | ICD-10-CM

## 2018-12-19 DIAGNOSIS — I639 Cerebral infarction, unspecified: Principal | ICD-10-CM | POA: Diagnosis present

## 2018-12-19 DIAGNOSIS — D72829 Elevated white blood cell count, unspecified: Secondary | ICD-10-CM | POA: Diagnosis present

## 2018-12-19 DIAGNOSIS — R42 Dizziness and giddiness: Secondary | ICD-10-CM | POA: Diagnosis not present

## 2018-12-19 DIAGNOSIS — Z8673 Personal history of transient ischemic attack (TIA), and cerebral infarction without residual deficits: Secondary | ICD-10-CM | POA: Diagnosis present

## 2018-12-19 DIAGNOSIS — Z823 Family history of stroke: Secondary | ICD-10-CM

## 2018-12-19 DIAGNOSIS — I6381 Other cerebral infarction due to occlusion or stenosis of small artery: Secondary | ICD-10-CM

## 2018-12-19 DIAGNOSIS — I10 Essential (primary) hypertension: Secondary | ICD-10-CM | POA: Diagnosis present

## 2018-12-19 DIAGNOSIS — Z7984 Long term (current) use of oral hypoglycemic drugs: Secondary | ICD-10-CM | POA: Diagnosis not present

## 2018-12-19 DIAGNOSIS — I6350 Cerebral infarction due to unspecified occlusion or stenosis of unspecified cerebral artery: Secondary | ICD-10-CM | POA: Diagnosis not present

## 2018-12-19 DIAGNOSIS — E119 Type 2 diabetes mellitus without complications: Secondary | ICD-10-CM

## 2018-12-19 DIAGNOSIS — R208 Other disturbances of skin sensation: Secondary | ICD-10-CM

## 2018-12-19 LAB — CBC WITH DIFFERENTIAL/PLATELET
Abs Immature Granulocytes: 0.06 10*3/uL (ref 0.00–0.07)
Basophils Absolute: 0.1 10*3/uL (ref 0.0–0.1)
Basophils Relative: 0 %
Eosinophils Absolute: 0 10*3/uL (ref 0.0–0.5)
Eosinophils Relative: 0 %
HCT: 43.4 % (ref 36.0–46.0)
Hemoglobin: 13.2 g/dL (ref 12.0–15.0)
Immature Granulocytes: 1 %
Lymphocytes Relative: 10 %
Lymphs Abs: 1.3 10*3/uL (ref 0.7–4.0)
MCH: 25 pg — ABNORMAL LOW (ref 26.0–34.0)
MCHC: 30.4 g/dL (ref 30.0–36.0)
MCV: 82.2 fL (ref 80.0–100.0)
Monocytes Absolute: 0.3 10*3/uL (ref 0.1–1.0)
Monocytes Relative: 2 %
Neutro Abs: 10.7 10*3/uL — ABNORMAL HIGH (ref 1.7–7.7)
Neutrophils Relative %: 87 %
Platelets: 397 10*3/uL (ref 150–400)
RBC: 5.28 MIL/uL — ABNORMAL HIGH (ref 3.87–5.11)
RDW: 14.3 % (ref 11.5–15.5)
WBC: 12.4 10*3/uL — ABNORMAL HIGH (ref 4.0–10.5)
nRBC: 0 % (ref 0.0–0.2)

## 2018-12-19 LAB — BASIC METABOLIC PANEL
Anion gap: 13 (ref 5–15)
BUN: 12 mg/dL (ref 6–20)
CO2: 22 mmol/L (ref 22–32)
Calcium: 9.4 mg/dL (ref 8.9–10.3)
Chloride: 100 mmol/L (ref 98–111)
Creatinine, Ser: 0.44 mg/dL (ref 0.44–1.00)
GFR calc Af Amer: >60 mL/min (ref >60)
GFR calc non Af Amer: >60 mL/min (ref >60)
Glucose, Bld: 300 mg/dL — ABNORMAL HIGH (ref 70–99)
Potassium: 4.1 mmol/L (ref 3.5–5.1)
Sodium: 135 mmol/L (ref 135–145)

## 2018-12-19 MED ORDER — ASPIRIN 81 MG PO CHEW
324.0000 mg | CHEWABLE_TABLET | Freq: Once | ORAL | Status: AC
  Administered 2018-12-19: 324 mg via ORAL
  Filled 2018-12-19: qty 4

## 2018-12-19 MED ORDER — INSULIN ASPART 100 UNIT/ML ~~LOC~~ SOLN
0.0000 [IU] | Freq: Every day | SUBCUTANEOUS | Status: DC
  Administered 2018-12-20: 4 [IU] via SUBCUTANEOUS
  Filled 2018-12-19: qty 1

## 2018-12-19 MED ORDER — CLOPIDOGREL BISULFATE 300 MG PO TABS
300.0000 mg | ORAL_TABLET | Freq: Once | ORAL | Status: AC
  Administered 2018-12-19: 300 mg via ORAL
  Filled 2018-12-19: qty 1

## 2018-12-19 MED ORDER — INSULIN ASPART 100 UNIT/ML ~~LOC~~ SOLN
0.0000 [IU] | Freq: Three times a day (TID) | SUBCUTANEOUS | Status: DC
  Administered 2018-12-20: 8 [IU] via SUBCUTANEOUS
  Administered 2018-12-20: 5 [IU] via SUBCUTANEOUS
  Administered 2018-12-20: 8 [IU] via SUBCUTANEOUS
  Administered 2018-12-21: 3 [IU] via SUBCUTANEOUS

## 2018-12-19 NOTE — ED Triage Notes (Addendum)
Pt reports L sided tingling and numbness that started yesterday, She states that the tingling is on the left side of her face, her L hand, and her left lower leg. She also reports some positional dizziness. She also reports back pain and states that she has been sitting a lot more because she is working from home. She is concerned for a pinched nerve.

## 2018-12-19 NOTE — ED Notes (Signed)
Attempted 2 straight sticks, right hand and right ac, but patient moved on both attempts.

## 2018-12-19 NOTE — ED Notes (Signed)
ED TO INPATIENT HANDOFF REPORT  ED Nurse Name and Phone #: Florentina Addison, RN  S Name/Age/Gender Haley Gomez 53 y.o. female Room/Bed: WA16/WA16  Code Status   Code Status: Not on file  Home/SNF/Other Home Patient oriented to: AOx4 Is this baseline? Yes   Triage Complete: Triage complete  Chief Complaint Left side numbness  Triage Note Pt reports L sided tingling and numbness that started yesterday, She states that the tingling is on the left side of her face, her L hand, and her left lower leg. She also reports some positional dizziness. She also reports back pain and states that she has been sitting a lot more because she is working from home. She is concerned for a pinched nerve.   Allergies Allergies  Allergen Reactions  . Bactrim [Sulfamethoxazole-Trimethoprim] Nausea Only    Level of Care/Admitting Diagnosis ED Disposition    ED Disposition Condition Comment   Admit  Hospital Area: [facility] [[id number]]  Level of Care: Telemetry Medical [104]  Diagnosis: Right thalamic infarction [facility]) [[id number]]  Admitting Physician: Rometta Emery [[id number]]  Attending Physician: Rometta Emery [[id number]]  Estimated length of stay: past midnight tomorrow  Certification:: I certify this patient will need inpatient services for at least 2 midnights  PT Class (Do Not Modify): Inpatient [101]  PT Acc Code (Do Not Modify): Private [1]       B Medical/Surgery History Past Medical History:  Diagnosis Date  . Diabetes mellitus without complication (HCC)   . Hyperlipidemia    Past Surgical History:  Procedure Laterality Date  . CESAREAN SECTION    . GALLBLADDER SURGERY  1997     A IV Location/Drains/Wounds Patient Lines/Drains/Airways Status   Active Line/Drains/Airways    Name:   Placement date:   Placement time:   Site:   Days:   Peripheral IV 12/19/18 Left Antecubital   12/19/18    2324    Antecubital   less than 1          Intake/Output Last 24  hours No intake or output data in the 24 hours ending 12/19/18 2342  Labs/Imaging Results for orders placed or performed during the hospital encounter of 12/19/18 (from the past 48 hour(s))  CBC with Differential     Status: Abnormal   Collection Time: 12/19/18  9:04 PM  Result Value Ref Range   WBC 12.4 (H) 4.0 - 10.5 K/uL   RBC 5.28 (H) 3.87 - 5.11 MIL/uL   Hemoglobin 13.2 12.0 - 15.0 g/dL   HCT 06.8 16.6 - 19.6 %   MCV 82.2 80.0 - 100.0 fL   MCH 25.0 (L) 26.0 - 34.0 pg   MCHC 30.4 30.0 - 36.0 g/dL   RDW 94.0 98.2 - 86.7 %   Platelets 397 150 - 400 K/uL   nRBC 0.0 0.0 - 0.2 %   Neutrophils Relative % 87 %   Neutro Abs 10.7 (H) 1.7 - 7.7 K/uL   Lymphocytes Relative 10 %   Lymphs Abs 1.3 0.7 - 4.0 K/uL   Monocytes Relative 2 %   Monocytes Absolute 0.3 0.1 - 1.0 K/uL   Eosinophils Relative 0 %   Eosinophils Absolute 0.0 0.0 - 0.5 K/uL   Basophils Relative 0 %   Basophils Absolute 0.1 0.0 - 0.1 K/uL   Immature Granulocytes 1 %   Abs Immature Granulocytes 0.06 0.00 - 0.07 K/uL    Comment: Performed at [facility], [address], [place], [place] [address]  Basic  metabolic panel     Status: Abnormal   Collection Time: 12/19/18  9:04 PM  Result Value Ref Range   Sodium 135 135 - 145 mmol/L   Potassium 4.1 3.5 - 5.1 mmol/L   Chloride 100 98 - 111 mmol/L   CO2 22 22 - 32 mmol/L   Glucose, Bld 300 (H) 70 - 99 mg/dL   BUN 12 6 - 20 mg/dL   Creatinine, Ser 2.72 0.44 - 1.00 mg/dL   Calcium 9.4 8.9 - 53.6 mg/dL   GFR calc non Af Amer >60 >60 mL/min   GFR calc Af Amer >60 >60 mL/min   Anion gap 13 5 - 15    Comment: Performed at [facility], [address] [place], [place] [address]   Mr Brain Wo Contrast  Result Date: 12/19/2018 CLINICAL DATA:  Left-sided tingling and numbness EXAM: MRI HEAD WITHOUT CONTRAST TECHNIQUE: Multiplanar, multiecho pulse sequences of the brain and surrounding structures were obtained without intravenous  contrast. COMPARISON:  Brain MRI 02/10/2018 FINDINGS: BRAIN: Small acute infarct of the dorsolateral right thalamus. The midline structures are normal. No midline shift or other mass effect. The white matter signal is normal for the patient's age. The cerebral and cerebellar volume are age-appropriate. No hydrocephalus. Susceptibility-sensitive sequences show no chronic microhemorrhage or superficial siderosis. No mass lesion. VASCULAR: The major intracranial arterial and venous sinus flow voids are normal. SKULL AND UPPER CERVICAL SPINE: Calvarial bone marrow signal is normal. There is no skull base mass. Visualized upper cervical spine and soft tissues are normal. SINUSES/ORBITS: No fluid levels or advanced mucosal thickening. No mastoid or middle ear effusion. The orbits are normal. IMPRESSION: 1. Small acute infarct of the dorsolateral right thalamus, in close proximity to the posterior limb of the internal capsule, in keeping with the reported left-sided symptoms. 2. Otherwise normal MRI of the brain. Electronically Signed   By: Deatra Robinson M.D.   On: 12/19/2018 22:18    Pending Labs [employer] (From admission, onward)    Start     Ordered   Signed and Held  HIV antibody (Routine Testing)  Once,   R     Signed and Held   Signed and Held  Hemoglobin A1c  Tomorrow morning,   R     Signed and Held   Signed and Held  Lipid panel  Tomorrow morning,   R    Comments:  Fasting    Signed and Held   Signed and Held  CBC  (enoxaparin (LOVENOX)    CrCl >/= 30 ml/min)  Once,   R    Comments:  Baseline for enoxaparin therapy IF NOT ALREADY DRAWN.  Notify MD if PLT < 100 K.    Signed and Held   Signed and Held  Creatinine, serum  (enoxaparin (LOVENOX)    CrCl >/= 30 ml/min)  Once,   R    Comments:  Baseline for enoxaparin therapy IF NOT ALREADY DRAWN.    Signed and Held   Signed and Held  Creatinine, serum  (enoxaparin (LOVENOX)    CrCl >/= 30 ml/min)  Weekly,   R    Comments:  while on  enoxaparin therapy    Signed and Held          Vitals/Pain Today's Vitals   12/19/18 2014 12/19/18 2025 12/19/18 2321 12/19/18 2322  BP: (!) 155/87  124/88   Pulse: (!) 101  92   Resp: 18  17   Temp: 98.2 F (36.8 C)  TempSrc: Oral     SpO2: 99%  95% 95%  Weight: 73.9 kg     Height: 5\' 2"  (1.575 m)     PainSc:  7       Isolation Precautions No active isolations  Medications Medications  insulin aspart (novoLOG) injection 0-15 Units (has no administration in time range)  insulin aspart (novoLOG) injection 0-5 Units (has no administration in time range)  aspirin chewable tablet 324 mg (324 mg Oral Given 12/19/18 2311)  clopidogrel (PLAVIX) tablet 300 mg (300 mg Oral Given 12/19/18 2312)    Mobility walks Low fall risk   Focused Assessments Cardiac Assessment Handoff:    No results found for: CKTOTAL, CKMB, CKMBINDEX, TROPONINI No results found for: DDIMER Does the Patient currently have chest pain? No      R Recommendations: See Admitting Provider Note  Report given to:   Additional Notes: N/A

## 2018-12-19 NOTE — ED Triage Notes (Addendum)
Numbness in left hand and forearm, no numbness in upper arm.  Outside of left leg is numb, inner leg is not numb.   Onset yesterday of symptoms.  Symptoms went away after lying down.  This morning, no symptoms.  After he day started going, symptoms reoccurred.   Random back pain throughout the past few days  Tingling above lip.  Patient reports similar episode last year and says referred to as syncope.

## 2018-12-19 NOTE — Discharge Instructions (Signed)
Recommending further evaluation and management in the ED for worsening numbness and dizziness that began 1 day ago.  Patient aware and in agreement with this plan.

## 2018-12-19 NOTE — H&P (Signed)
History and Physical   Haley CushingMichelle G Gomez ZOX:[id number] DOB: 03-29-1966 DOA: 12/19/2018  Referring MD/NP/PA: Dr. Juleen ChinaKohut  PCP: Doreene Nestlark, Katherine K, NP   Patient coming from: Home  Chief Complaint: Left-sided numbness  HPI: Haley CushingMichelle G Brave is a 53 y.o. female with medical history significant of diabetes, hyperlipidemia presenting with left-sided numbness that started a day ago.  Numbness is localized to the upper lip left hand and forearm.  Also on the outside of the left foot.  It initially was about 30 minutes then went to hours.  It went away but came back today and has stayed.  Patient tried several things at home.  She thought it was going away but does not go away so she came to the ER.  She had similar episode last year when she was diagnosed with vertigo.  She had CT or MRI at that time that were negative.  Patient treated with meclizine and got better.  She denied any focal weakness.  Denied any dizziness this time around.  No nausea vomiting or diarrhea.  Patient had MRI done that showed small right thalamic infarct and so she is being admitted for work-up..  ED Course: Vitals showed blood pressure 155/87 with pulse of 102.  White count is 12.4 otherwise chemistry and CBC appear to be normal.  Blood glucose is 300.  MRI of the brain showed right thalamic infarct.  EKG has not been done.  Review of Systems: As per HPI otherwise 10 point review of systems negative.    Past Medical History:  Diagnosis Date  . Diabetes mellitus without complication (HCC)   . Hyperlipidemia     Past Surgical History:  Procedure Laterality Date  . CESAREAN SECTION    . GALLBLADDER SURGERY  1997     reports that she has never smoked. She has never used smokeless tobacco. She reports current alcohol use. She reports that she does not use drugs.  Allergies  Allergen Reactions  . Bactrim [Sulfamethoxazole-Trimethoprim] Nausea Only    Family History  Problem Relation Age of Onset  . Diabetes  Mother   . Alcohol abuse Father   . Hypertension Father   . Kidney disease Father   . Stroke Father      Prior to Admission medications   Medication Sig Start Date End Date Taking? Authorizing Provider  amphetamine-dextroamphetamine (ADDERALL) 10 MG tablet Take 10 mg by mouth 2 (two) times daily. 11/26/18  Yes [provider]  atorvastatin (LIPITOR) 20 MG tablet Take 1 tablet (20 mg total) by mouth daily. 06/17/15  Yes Doreene Nestlark, Katherine K, NP  diphenhydrAMINE (BENADRYL) 25 MG tablet Take 25 mg by mouth every 6 (six) hours as needed for sleep.   Yes [provider]  glipiZIDE (GLUCOTROL XL) 10 MG 24 hr tablet TAKE 1 TABLET BY MOUTH EVERY DAY WITH BREAKFAST   NEED OFFICE VISIT FOR FURTHER REFILLS 04/07/16  Yes Doreene Nestlark, Katherine K, NP  metFORMIN (GLUCOPHAGE) 1000 MG tablet Take 1,000 mg by mouth 2 (two) times daily. 01/16/15  Yes [provider]    Physical Exam: Vitals:   12/19/18 2014  BP: (!) 155/87  Pulse: (!) 101  Resp: 18  Temp: 98.2 F (36.8 C)  TempSrc: Oral  SpO2: 99%  Weight: 73.9 kg  Height: 5\' 2"  (1.575 m)      Constitutional: NAD, calm, comfortable Vitals:   12/19/18 2014  BP: (!) 155/87  Pulse: (!) 101  Resp: 18  Temp: 98.2 F (36.8 C)  TempSrc: Oral  SpO2: 99%  Weight: 73.9 kg  Height: 5\' 2"  (1.575 m)   Eyes: PERRL, lids and conjunctivae normal ENMT: Mucous membranes are moist. Posterior pharynx clear of any exudate or lesions.Normal dentition.  Neck: normal, supple, no masses, no thyromegaly Respiratory: clear to auscultation bilaterally, no wheezing, no crackles. Normal respiratory effort. No accessory muscle use.  Cardiovascular: Regular rate and rhythm, no murmurs / rubs / gallops. No extremity edema. 2+ pedal pulses. No carotid bruits.  Abdomen: no tenderness, no masses palpated. No hepatosplenomegaly. Bowel sounds positive.  Musculoskeletal: no clubbing / cyanosis. No joint deformity upper and lower extremities. Good ROM, no  contractures. Normal muscle tone.  Skin: no rashes, lesions, ulcers. No induration Neurologic: CN 2-12 grossly intact. Sensation intact, DTR normal. Strength 5/5 in all 4.  Decreased sensation in the upper and lower extremities laterally with left facial decreased sensation Psychiatric: Normal judgment and insight. Alert and oriented x 3. Normal mood.     Labs on Admission: I have personally reviewed following labs and imaging studies  CBC: Recent Labs  Lab 12/19/18 2104  WBC 12.4*  NEUTROABS 10.7*  HGB 13.2  HCT 43.4  MCV 82.2  PLT 397   Basic Metabolic Panel: Recent Labs  Lab 12/19/18 2104  NA 135  K 4.1  CL 100  CO2 22  GLUCOSE 300*  BUN 12  CREATININE 0.44  CALCIUM 9.4   GFR: Estimated Creatinine Clearance: 78.3 mL/min (by C-G formula based on SCr of 0.44 mg/dL). Liver Function Tests: No results for input(s): AST, ALT, ALKPHOS, BILITOT, PROT, ALBUMIN in the last 168 hours. No results for input(s): LIPASE, AMYLASE in the last 168 hours. No results for input(s): AMMONIA in the last 168 hours. Coagulation Profile: No results for input(s): INR, PROTIME in the last 168 hours. Cardiac Enzymes: No results for input(s): CKTOTAL, CKMB, CKMBINDEX, TROPONINI in the last 168 hours. BNP (last 3 results) No results for input(s): PROBNP in the last 8760 hours. HbA1C: No results for input(s): HGBA1C in the last 72 hours. CBG: No results for input(s): GLUCAP in the last 168 hours. Lipid Profile: No results for input(s): CHOL, HDL, LDLCALC, TRIG, CHOLHDL, LDLDIRECT in the last 72 hours. Thyroid Function Tests: No results for input(s): TSH, T4TOTAL, FREET4, T3FREE, THYROIDAB in the last 72 hours. Anemia Panel: No results for input(s): VITAMINB12, FOLATE, FERRITIN, TIBC, IRON, RETICCTPCT in the last 72 hours. Urine analysis: No results found for: COLORURINE, APPEARANCEUR, LABSPEC, PHURINE, GLUCOSEU, HGBUR, BILIRUBINUR, KETONESUR, PROTEINUR, UROBILINOGEN, NITRITE, LEUKOCYTESUR  Sepsis Labs: @LABRCNTIP (procalcitonin:4,lacticidven:4) )No results found for this or any previous visit (from the past 240 hour(s)).   Radiological Exams on Admission: Mr Brain Wo Contrast  Result Date: 12/19/2018 CLINICAL DATA:  Left-sided tingling and numbness EXAM: MRI HEAD WITHOUT CONTRAST TECHNIQUE: Multiplanar, multiecho pulse sequences of the brain and surrounding structures were obtained without intravenous contrast. COMPARISON:  Brain MRI 02/10/2018 FINDINGS: BRAIN: Small acute infarct of the dorsolateral right thalamus. The midline structures are normal. No midline shift or other mass effect. The white matter signal is normal for the patient's age. The cerebral and cerebellar volume are age-appropriate. No hydrocephalus. Susceptibility-sensitive sequences show no chronic microhemorrhage or superficial siderosis. No mass lesion. VASCULAR: The major intracranial arterial and venous sinus flow voids are normal. SKULL AND UPPER CERVICAL SPINE: Calvarial bone marrow signal is normal. There is no skull base mass. Visualized upper cervical spine and soft tissues are normal. SINUSES/ORBITS: No fluid levels or advanced mucosal thickening. No mastoid or middle ear effusion. The orbits are  normal. IMPRESSION: 1. Small acute infarct of the dorsolateral right thalamus, in close proximity to the posterior limb of the internal capsule, in keeping with the reported left-sided symptoms. 2. Otherwise normal MRI of the brain. Electronically Signed   By: Deatra Robinson M.D.   On: 12/19/2018 22:18    EKG: Independently reviewed.  EKG is currently pending  Assessment/Plan Principal Problem:   Right thalamic infarction [facility]) Active Problems:   Type 2 diabetes mellitus (HCC)   Hyperlipidemia   Leucocytosis     #1 right thalamic infarct: Patient will be [facility] to be followed up by neurology.  MRI has been done.  Check carotid Dopplers with echocardiogram.  Aspirin and Plavix  initiated.  PT OT and speech therapy consultation.  Neurology will follow up with the patient.  Check EKG to rule out A. fib  #2 diabetes: Initiate sliding scale insulin with glycemic control.  She is on oral hypoglycemics that we will hold for now.  #3 hyperlipidemia: Patient on Lipitor but only 20 mg.  I will place her on 40 mg daily.  #4 leukocytosis: Cause not quite clear.  Could be related to her ongoing symptoms.  Continue close monitoring.  DVT prophylaxis: Lovenox Code Status: Full code Family Communication: Discussed care with patient Disposition Plan: To be determined Consults called: Dr. Jerrell Belfast neurology Admission status: Inpatient  Severity of Illness: The appropriate patient status for this patient is INPATIENT. Inpatient status is judged to be reasonable and necessary in order to provide the required intensity of service to ensure the patient's safety. The patient's presenting symptoms, physical exam findings, and initial radiographic and laboratory data in the context of their chronic comorbidities is felt to place them at high risk for further clinical deterioration. Furthermore, it is not anticipated that the patient will be medically stable for discharge from the hospital within 2 midnights of admission. The following factors support the patient status of inpatient.   " The patient's presenting symptoms include left-sided numbness. " The worrisome physical exam findings include no focal weakness. " The initial radiographic and laboratory data are worrisome because of MRI showing acute infarct. " The chronic co-morbidities include diabetes with hyperlipidemia.   * I certify that at the point of admission it is my clinical judgment that the patient will require inpatient hospital care spanning beyond 2 midnights from the point of admission due to high intensity of service, high risk for further deterioration and high frequency of surveillance required.Lonia Blood MD  [facility] Pager [phone]  If 7PM-7AM, please contact night-coverage [email or web address] Password [facility]  12/19/2018, 11:19 PM

## 2018-12-19 NOTE — ED Provider Notes (Signed)
[facility]   [id number] 12/19/18 Arrival Time: 1827  CC: Left sided numbness  SUBJECTIVE:  Haley Gomez is a 53 y.o. female hx significant for DM and HLD, who complains of intermittent numbness that began 1 day ago.  Denies a precipitating event, or recent head trauma.  Localizes numbness to upper lip, left hand and forearm, and outside of left foot. Was intermittent and lasted approximately 30 minutes to 1-2 hours yesterday. Now constant today.  Patient has tried resting with temporary relief. Symptoms are made worse with sitting at her desk at home for work.  Reports similar symptoms last year and diagnosed with vertigo.  Treated with meclizine with relief.  Had negative CT and MRI at that time. Also mentions associated dizziness like the she is on a boat.  Patient denies HA, fever, chills, vision changes, facial asymmetries, slurred speech, incoordination, changes in memory, nausea, vomiting, chest pain, SOB, abdominal pain, weakness, tingling.    Family hx significant for stroke in father at age 24  ROS: As per HPI.  Past Medical History:  Diagnosis Date  . Diabetes mellitus without complication (HCC)   . Hyperlipidemia    Past Surgical History:  Procedure Laterality Date  . CESAREAN SECTION    . GALLBLADDER SURGERY  1997   Allergies  Allergen Reactions  . Bactrim [Sulfamethoxazole-Trimethoprim]    No current facility-administered medications on file prior to encounter.    Current Outpatient Medications on File Prior to Encounter  Medication Sig Dispense Refill  . atorvastatin (LIPITOR) 20 MG tablet Take 1 tablet (20 mg total) by mouth daily. 90 tablet 3  . glipiZIDE (GLUCOTROL XL) 10 MG 24 hr tablet TAKE 1 TABLET BY MOUTH EVERY DAY WITH BREAKFAST   NEED OFFICE VISIT FOR FURTHER REFILLS 30 tablet 0  . metFORMIN (GLUCOPHAGE) 1000 MG tablet Take 1,000 mg by mouth 2 (two) times daily.  6  . diphenhydrAMINE (BENADRYL) 25 MG tablet Take 25 mg by mouth every 6 (six)  hours as needed for sleep.     Social History   Socioeconomic History  . Marital status: Divorced    Spouse name: Not on file  . Number of children: Not on file  . Years of education: Not on file  . Highest education level: Not on file  Occupational History  . Not on file  Social Needs  . Financial resource strain: Not on file  . Food insecurity:    Worry: Not on file    Inability: Not on file  . Transportation needs:    Medical: Not on file    Non-medical: Not on file  Tobacco Use  . Smoking status: Never Smoker  . Smokeless tobacco: Never Used  Substance and Sexual Activity  . Alcohol use: Yes    Alcohol/week: 0.0 standard drinks    Comment: social  . Drug use: Never  . Sexual activity: Not on file  Lifestyle  . Physical activity:    Days per week: Not on file    Minutes per session: Not on file  . Stress: Not on file  Relationships  . Social connections:    Talks on phone: Not on file    Gets together: Not on file    Attends religious service: Not on file    Active member of club or organization: Not on file    Attends meetings of clubs or organizations: Not on file    Relationship status: Not on file  . Intimate partner violence:    Fear  of current or ex partner: Not on file    Emotionally abused: Not on file    Physically abused: Not on file    Forced sexual activity: Not on file  Other Topics Concern  . Not on file  Social History Narrative   Single.   3 children.   Works as a Engineer, civil (consulting) at [employer]   Enjoys traveling, spending time with friends, exercising, reading.   Family History  Problem Relation Age of Onset  . Diabetes Mother   . Alcohol abuse Father   . Hypertension Father   . Kidney disease Father   . Stroke Father     OBJECTIVE:  Vitals:   12/19/18 1858  BP: 121/83  Pulse: (!) 102  Resp: 18  Temp: 98.1 F (36.7 C)  TempSrc: Oral  SpO2: 97%    General appearance: Alert and oriented to person, place, and upcoming holiday; no distress  Eyes: PERRLA; EOMI HENT: normocephalic; atraumatic Neck: supple with FROM Lungs: clear to auscultation bilaterally Heart: regular rate and rhythm.  Radial pulses 2+ symmetrical bilaterally Extremities: no edema; symmetrical with no gross deformities Skin: warm and dry Neurologic: CN 2-12 grossly intact; finger to nose without difficulty; negative pronator drift; RAM without difficulty; normal gait; 2+ symmetric patellar reflexes; strength intact bilaterally about the upper and lower extremities; decreased sensation about left hand, and lateral aspect of left foot Psychological: alert and cooperative; normal mood and affect  ASSESSMENT & PLAN:  1. Numbness of left hand   2. Numbness of left foot   3. Dizziness    Recommending further evaluation and management in the ED for worsening numbness and dizziness that began 1 day ago.  Patient aware and in agreement with this plan.     Rennis Harding, PA-C 12/19/18 1958

## 2018-12-19 NOTE — ED Provider Notes (Signed)
[facility]-EMERGENCY DEPT Provider Note   CSN: [id number] Arrival date & time: 12/19/18  2010    History   Chief Complaint Chief Complaint  Patient presents with  . Tingling  . Back Pain    HPI TIAHNA SHIERS is a 53 y.o. female.     HPI   53 year old female with left-sided numbness.  First noticed around 815.  She woke up earlier in the morning with no symptoms.  She has had numbness in the left side of her face, her left hand up into the dorsum of the left forearm and over the lateral aspect of her left shin and foot.  No pain.  No weakness.  No acute visual changes.  No nausea.  No problems with balance or coordination.  She describes some intermittent "dizziness" but she states that this is been ongoing issue for her.  Past Medical History:  Diagnosis Date  . Diabetes mellitus without complication (HCC)   . Hyperlipidemia     Patient Active Problem List   Diagnosis Date Noted  . Hot flashes 06/17/2015  . Preventative health care 06/17/2015  . Type 2 diabetes mellitus (HCC) 03/05/2015  . Hyperlipidemia 03/05/2015    Past Surgical History:  Procedure Laterality Date  . CESAREAN SECTION    . GALLBLADDER SURGERY  1997     OB History   No obstetric history on file.      Home Medications    Prior to Admission medications   Medication Sig Start Date End Date Taking? Authorizing Provider  atorvastatin (LIPITOR) 20 MG tablet Take 1 tablet (20 mg total) by mouth daily. 06/17/15   Doreene Nest, NP  diphenhydrAMINE (BENADRYL) 25 MG tablet Take 25 mg by mouth every 6 (six) hours as needed for sleep.    [provider]  glipiZIDE (GLUCOTROL XL) 10 MG 24 hr tablet TAKE 1 TABLET BY MOUTH EVERY DAY WITH BREAKFAST   NEED OFFICE VISIT FOR FURTHER REFILLS 04/07/16   Doreene Nest, NP  metFORMIN (GLUCOPHAGE) 1000 MG tablet Take 1,000 mg by mouth 2 (two) times daily. 01/16/15   [provider]    Family History Family  History  Problem Relation Age of Onset  . Diabetes Mother   . Alcohol abuse Father   . Hypertension Father   . Kidney disease Father   . Stroke Father     Social History Social History   Tobacco Use  . Smoking status: Never Smoker  . Smokeless tobacco: Never Used  Substance Use Topics  . Alcohol use: Yes    Alcohol/week: 0.0 standard drinks    Comment: social  . Drug use: Never     Allergies   Bactrim [sulfamethoxazole-trimethoprim]   Review of Systems Review of Systems All systems reviewed and negative, other than as noted in HPI.   Physical Exam Updated Vital Signs BP (!) 155/87 (BP Location: Left Arm)   Pulse (!) 101   Temp 98.2 F (36.8 C) (Oral)   Resp 18   Ht 5\' 2"  (1.575 m)   Wt 73.9 kg   SpO2 99%   BMI 29.81 kg/m   Physical Exam Vitals signs and nursing note reviewed.  Constitutional:      General: She is not in acute distress.    Appearance: She is well-developed.  HENT:     Head: Normocephalic and atraumatic.  Eyes:     General:        Right eye: No discharge.  Left eye: No discharge.     Conjunctiva/sclera: Conjunctivae normal.  Neck:     Musculoskeletal: Neck supple.  Cardiovascular:     Rate and Rhythm: Normal rate and regular rhythm.     Heart sounds: Normal heart sounds. No murmur. No friction rub. No gallop.   Pulmonary:     Effort: Pulmonary effort is normal. No respiratory distress.     Breath sounds: Normal breath sounds.  Abdominal:     General: There is no distension.     Palpations: Abdomen is soft.     Tenderness: There is no abdominal tenderness.  Musculoskeletal:        General: No tenderness.  Skin:    General: Skin is warm and dry.  Neurological:     Mental Status: She is alert. She is disoriented.     Cranial Nerves: No cranial nerve deficit.     Motor: No weakness.     Coordination: Coordination normal.     Comments: Sensation intact to light touch in the face.  There is decreased sensation to light touch  in the left hand over the dorsum of the left forearm and along the ulnar aspect of the forearm to just below the elbow.  Decreased sensation to light touch along the left lateral lower leg into the dorsal lateral aspect of the foot.  Psychiatric:        Behavior: Behavior normal.        Thought Content: Thought content normal.      ED Treatments / Results  Labs (all labs ordered are listed, but only abnormal results are displayed) Labs Reviewed  CBC WITH DIFFERENTIAL/PLATELET - Abnormal; Notable for the following components:      Result Value   WBC 12.4 (*)    RBC 5.28 (*)    MCH 25.0 (*)    Neutro Abs 10.7 (*)    All other components within normal limits  BASIC METABOLIC PANEL - Abnormal; Notable for the following components:   Glucose, Bld 300 (*)    All other components within normal limits  HEMOGLOBIN A1C - Abnormal; Notable for the following components:   Hgb A1c MFr Bld 12.5 (*)    All other components within normal limits  LIPID PANEL - Abnormal; Notable for the following components:   Cholesterol 249 (*)    HDL 40 (*)    LDL Cholesterol 199 (*)    All other components within normal limits  CBC - Abnormal; Notable for the following components:   WBC 11.0 (*)    MCV 77.8 (*)    MCH 24.9 (*)    All other components within normal limits  GLUCOSE, CAPILLARY - Abnormal; Notable for the following components:   Glucose-Capillary 259 (*)    All other components within normal limits  GLUCOSE, CAPILLARY - Abnormal; Notable for the following components:   Glucose-Capillary 266 (*)    All other components within normal limits  GLUCOSE, CAPILLARY - Abnormal; Notable for the following components:   Glucose-Capillary 218 (*)    All other components within normal limits  CBG MONITORING, ED - Abnormal; Notable for the following components:   Glucose-Capillary 316 (*)    All other components within normal limits  HIV ANTIBODY (ROUTINE TESTING W REFLEX)  CREATININE, SERUM  RAPID  URINE DRUG SCREEN, HOSP PERFORMED    EKG None  Radiology Mr Brain Wo Contrast  Result Date: 12/19/2018 CLINICAL DATA:  Left-sided tingling and numbness EXAM: MRI HEAD WITHOUT CONTRAST TECHNIQUE: Multiplanar, multiecho pulse  sequences of the brain and surrounding structures were obtained without intravenous contrast. COMPARISON:  Brain MRI 02/10/2018 FINDINGS: BRAIN: Small acute infarct of the dorsolateral right thalamus. The midline structures are normal. No midline shift or other mass effect. The white matter signal is normal for the patient's age. The cerebral and cerebellar volume are age-appropriate. No hydrocephalus. Susceptibility-sensitive sequences show no chronic microhemorrhage or superficial siderosis. No mass lesion. VASCULAR: The major intracranial arterial and venous sinus flow voids are normal. SKULL AND UPPER CERVICAL SPINE: Calvarial bone marrow signal is normal. There is no skull base mass. Visualized upper cervical spine and soft tissues are normal. SINUSES/ORBITS: No fluid levels or advanced mucosal thickening. No mastoid or middle ear effusion. The orbits are normal. IMPRESSION: 1. Small acute infarct of the dorsolateral right thalamus, in close proximity to the posterior limb of the internal capsule, in keeping with the reported left-sided symptoms. 2. Otherwise normal MRI of the brain. Electronically Signed   By: Deatra Robinson M.D.   On: 12/19/2018 22:18    Procedures Procedures (including critical care time)  Medications Ordered in ED Medications - No data to display   Initial Impression / Assessment and Plan / ED Course  I have reviewed the triage vital signs and the nursing notes.  Pertinent labs & imaging results that were available during my care of the patient were reviewed by me and considered in my medical decision making (see chart for details).   53 year old female with left-sided numbness.  Exam is significant for decreased sensorium.  Otherwise nonfocal.   Mild symptoms and well outside of window for TPA with last normal around 8 am this morning.  MRI did show an acute right thalamic stroke.  Aspirin given.  Will consult neurology.  Admission for further evaluation.  Discussed with Dr Laurence Slate, neurology. Transfer [facility]. Also wanted plavix load. Will discuss with medicine for admission.   Final Clinical Impressions(s) / ED Diagnoses   Final diagnoses:  Cerebrovascular accident (CVA), unspecified mechanism [facility])    ED Discharge Orders    None       Raeford Razor, MD 12/20/18 2013

## 2018-12-19 NOTE — ED Notes (Signed)
Gave pt phone and asked she answer it when it rings bc pharm need to reconcile her meds.

## 2018-12-19 NOTE — ED Notes (Signed)
Patient transported to MRI 

## 2018-12-20 ENCOUNTER — Inpatient Hospital Stay (HOSPITAL_COMMUNITY): Payer: Managed Care, Other (non HMO)

## 2018-12-20 ENCOUNTER — Telehealth: Payer: Self-pay | Admitting: Primary Care

## 2018-12-20 DIAGNOSIS — I6350 Cerebral infarction due to unspecified occlusion or stenosis of unspecified cerebral artery: Secondary | ICD-10-CM

## 2018-12-20 DIAGNOSIS — I639 Cerebral infarction, unspecified: Principal | ICD-10-CM

## 2018-12-20 DIAGNOSIS — E785 Hyperlipidemia, unspecified: Secondary | ICD-10-CM

## 2018-12-20 LAB — CBC
HCT: 39.7 % (ref 36.0–46.0)
Hemoglobin: 12.7 g/dL (ref 12.0–15.0)
MCH: 24.9 pg — ABNORMAL LOW (ref 26.0–34.0)
MCHC: 32 g/dL (ref 30.0–36.0)
MCV: 77.8 fL — ABNORMAL LOW (ref 80.0–100.0)
Platelets: 377 10*3/uL (ref 150–400)
RBC: 5.1 MIL/uL (ref 3.87–5.11)
RDW: 13.9 % (ref 11.5–15.5)
WBC: 11 10*3/uL — ABNORMAL HIGH (ref 4.0–10.5)
nRBC: 0 % (ref 0.0–0.2)

## 2018-12-20 LAB — LIPID PANEL
Cholesterol: 249 mg/dL — ABNORMAL HIGH (ref 0–200)
HDL: 40 mg/dL — ABNORMAL LOW (ref >40)
LDL Cholesterol: 199 mg/dL — ABNORMAL HIGH (ref 0–99)
Total CHOL/HDL Ratio: 6.2 RATIO
Triglycerides: 50 mg/dL (ref <150)
VLDL: 10 mg/dL (ref 0–40)

## 2018-12-20 LAB — CREATININE, SERUM
Creatinine, Ser: 0.55 mg/dL (ref 0.44–1.00)
GFR calc Af Amer: >60 mL/min (ref >60)
GFR calc non Af Amer: >60 mL/min (ref >60)

## 2018-12-20 LAB — GLUCOSE, CAPILLARY
Glucose-Capillary: 259 mg/dL — ABNORMAL HIGH (ref 70–99)
Glucose-Capillary: 266 mg/dL — ABNORMAL HIGH (ref 70–99)
Glucose-Capillary: 218 mg/dL — ABNORMAL HIGH (ref 70–99)
Glucose-Capillary: 173 mg/dL — ABNORMAL HIGH (ref 70–99)

## 2018-12-20 LAB — HIV ANTIBODY (ROUTINE TESTING W REFLEX): HIV Screen 4th Generation wRfx: NONREACTIVE

## 2018-12-20 LAB — ECHOCARDIOGRAM LIMITED
Height: 62 in
Weight: 2447.99 oz

## 2018-12-20 LAB — RAPID URINE DRUG SCREEN, HOSP PERFORMED
Amphetamines: NOT DETECTED
Barbiturates: NOT DETECTED
Benzodiazepines: NOT DETECTED
Cocaine: NOT DETECTED
Opiates: NOT DETECTED
Tetrahydrocannabinol: NOT DETECTED

## 2018-12-20 LAB — CBG MONITORING, ED: Glucose-Capillary: 316 mg/dL — ABNORMAL HIGH (ref 70–99)

## 2018-12-20 LAB — HEMOGLOBIN A1C
Hgb A1c MFr Bld: 12.5 % — ABNORMAL HIGH (ref 4.8–5.6)
Mean Plasma Glucose: 312.05 mg/dL

## 2018-12-20 MED ORDER — IOHEXOL 350 MG/ML SOLN
50.0000 mL | Freq: Once | INTRAVENOUS | Status: AC | PRN
  Administered 2018-12-20: 50 mL via INTRAVENOUS

## 2018-12-20 MED ORDER — ASPIRIN EC 81 MG PO TBEC
81.0000 mg | DELAYED_RELEASE_TABLET | Freq: Every day | ORAL | Status: DC
  Administered 2018-12-21: 81 mg via ORAL
  Filled 2018-12-20: qty 1

## 2018-12-20 MED ORDER — LIVING WELL WITH DIABETES BOOK: Freq: Once | Status: DC

## 2018-12-20 MED ORDER — INSULIN GLARGINE 100 UNIT/ML SOLOSTAR PEN: 8.0000 [IU] | PEN_INJECTOR | Freq: Every day | SUBCUTANEOUS | 3 refills | Status: DC

## 2018-12-20 MED ORDER — BLOOD GLUCOSE MONITOR KIT: PACK | 0 refills | Status: AC

## 2018-12-20 MED ORDER — ACETAMINOPHEN 650 MG RE SUPP: 650.0000 mg | RECTAL | Status: DC | PRN

## 2018-12-20 MED ORDER — ASPIRIN EC 325 MG PO TBEC
325.0000 mg | DELAYED_RELEASE_TABLET | Freq: Every day | ORAL | Status: DC
  Administered 2018-12-20: 325 mg via ORAL
  Filled 2018-12-20: qty 1

## 2018-12-20 MED ORDER — SENNOSIDES-DOCUSATE SODIUM 8.6-50 MG PO TABS: 1.0000 | ORAL_TABLET | Freq: Every evening | ORAL | Status: DC | PRN

## 2018-12-20 MED ORDER — METFORMIN HCL 1000 MG PO TABS: 1000.0000 mg | ORAL_TABLET | Freq: Two times a day (BID) | ORAL | 3 refills | Status: DC

## 2018-12-20 MED ORDER — GLIPIZIDE ER 10 MG PO TB24
10.0000 mg | ORAL_TABLET | Freq: Every day | ORAL | Status: DC
  Administered 2018-12-20 – 2018-12-21 (×2): 10 mg via ORAL
  Filled 2018-12-20 (×2): qty 1

## 2018-12-20 MED ORDER — CLOPIDOGREL BISULFATE 75 MG PO TABS: 75.0000 mg | ORAL_TABLET | Freq: Every day | ORAL | 0 refills | Status: DC

## 2018-12-20 MED ORDER — INSULIN STARTER KIT- PEN NEEDLES (ENGLISH): 1.0000 | Freq: Once | Status: DC

## 2018-12-20 MED ORDER — ASPIRIN 300 MG RE SUPP: 300.0000 mg | Freq: Every day | RECTAL | Status: DC

## 2018-12-20 MED ORDER — ATORVASTATIN CALCIUM 80 MG PO TABS: 80.0000 mg | ORAL_TABLET | Freq: Every day | ORAL | 3 refills | Status: DC

## 2018-12-20 MED ORDER — STROKE: EARLY STAGES OF RECOVERY BOOK
Freq: Once | Status: AC
  Administered 2018-12-20: 02:00:00

## 2018-12-20 MED ORDER — GLIPIZIDE ER 10 MG PO TB24: ORAL_TABLET | ORAL | 3 refills | Status: DC

## 2018-12-20 MED ORDER — ATORVASTATIN CALCIUM 80 MG PO TABS
80.0000 mg | ORAL_TABLET | Freq: Every day | ORAL | Status: DC
  Administered 2018-12-20: 80 mg via ORAL
  Filled 2018-12-20: qty 1

## 2018-12-20 MED ORDER — SODIUM CHLORIDE 0.9 % IV SOLN
INTRAVENOUS | Status: DC
  Administered 2018-12-20: 02:00:00 via INTRAVENOUS

## 2018-12-20 MED ORDER — ASPIRIN 325 MG PO TABS: 325.0000 mg | ORAL_TABLET | Freq: Every day | ORAL | Status: DC

## 2018-12-20 MED ORDER — ACETAMINOPHEN 325 MG PO TABS: 650.0000 mg | ORAL_TABLET | ORAL | Status: DC | PRN

## 2018-12-20 MED ORDER — PERFLUTREN LIPID MICROSPHERE
INTRAVENOUS | Status: AC
  Filled 2018-12-20: qty 10

## 2018-12-20 MED ORDER — ATORVASTATIN CALCIUM 40 MG PO TABS: 40.0000 mg | ORAL_TABLET | Freq: Every day | ORAL | Status: DC

## 2018-12-20 MED ORDER — CLOPIDOGREL BISULFATE 75 MG PO TABS
75.0000 mg | ORAL_TABLET | Freq: Every day | ORAL | Status: DC
  Administered 2018-12-20 – 2018-12-21 (×2): 75 mg via ORAL
  Filled 2018-12-20 (×2): qty 1

## 2018-12-20 MED ORDER — PANTOPRAZOLE SODIUM 40 MG PO TBEC: 40.0000 mg | DELAYED_RELEASE_TABLET | Freq: Every day | ORAL | 0 refills | Status: DC

## 2018-12-20 MED ORDER — PANTOPRAZOLE SODIUM 40 MG PO TBEC
40.0000 mg | DELAYED_RELEASE_TABLET | Freq: Every day | ORAL | Status: DC
  Administered 2018-12-20 – 2018-12-21 (×2): 40 mg via ORAL
  Filled 2018-12-20 (×2): qty 1

## 2018-12-20 MED ORDER — INSULIN GLARGINE 100 UNIT/ML ~~LOC~~ SOLN
8.0000 [IU] | Freq: Every day | SUBCUTANEOUS | Status: DC
  Administered 2018-12-20: 8 [IU] via SUBCUTANEOUS
  Filled 2018-12-20 (×2): qty 0.08

## 2018-12-20 MED ORDER — ENOXAPARIN SODIUM 40 MG/0.4ML ~~LOC~~ SOLN
40.0000 mg | Freq: Every day | SUBCUTANEOUS | Status: DC
  Administered 2018-12-20: 40 mg via SUBCUTANEOUS
  Filled 2018-12-20 (×2): qty 0.4

## 2018-12-20 MED ORDER — PERFLUTREN LIPID MICROSPHERE
1.0000 mL | INTRAVENOUS | Status: AC | PRN
  Administered 2018-12-20: 2 mL via INTRAVENOUS
  Filled 2018-12-20: qty 10

## 2018-12-20 MED ORDER — ACETAMINOPHEN 160 MG/5ML PO SOLN: 650.0000 mg | ORAL | Status: DC | PRN

## 2018-12-20 NOTE — Evaluation (Signed)
Speech Language Pathology Evaluation Patient Details Name: Haley Gomez MRN: [id number] DOB: Apr 29, 1966 Today's Date: 12/20/2018 Time: 1898-4210 SLP Time Calculation (min) (ACUTE ONLY): 21 min  Problem List:  Patient Active Problem List   Diagnosis Date Noted  . Leucocytosis 12/19/2018  . Right thalamic infarction (HCC) 12/19/2018  . Hot flashes 06/17/2015  . Preventative health care 06/17/2015  . Type 2 diabetes mellitus (HCC) 03/05/2015  . Hyperlipidemia 03/05/2015   Past Medical History:  Past Medical History:  Diagnosis Date  . Diabetes mellitus without complication (HCC)   . Hyperlipidemia    Past Surgical History:  Past Surgical History:  Procedure Laterality Date  . CESAREAN SECTION    . GALLBLADDER SURGERY  1997   HPI:  Pt is a 53 y.o. female with medical history significant of diabetes, hyperlipidemia who presented to the ED with left-sided numbness that started a day prior to admission. Numbness was localized to the upper lip left hand and forearm and on the outside of the left foot. MRI of the brain revealed small acute infarct of the dorsolateral right thalamus, in close proximity to the posterior limb of the internal capsule.   Assessment / Plan / Recommendation Clinical Impression  Pt is a registered nurse and she denied any prior or new deficits in speech/language/cognition. The [facility] Cognitive Assessment 8.1 was completed to evaluate the pt's cognitive-linguistic skills. She achieved a score of 28/30 which is within the normal limits of 26 or more out of 30 and no speech/language deficits were demonstrated. Further skilled SLP services are not clinically indicated at this time. Pt and nursing were educated regarding this and both parties verbalized understanding as well as agreement with plan of care.    SLP Assessment  SLP Recommendation/Assessment: Patient does not need any further Speech Lanaguage Pathology Services SLP Visit Diagnosis: Cognitive  communication deficit (R41.841)    Follow Up Recommendations  None    Frequency and Duration           SLP Evaluation Cognition  Overall Cognitive Status: Within Functional Limits for tasks assessed Arousal/Alertness: Awake/alert Orientation Level: Oriented X4 Attention: Focused;Sustained Focused Attention: Appears intact(Vigilance WNL: 1/1) Sustained Attention: Impaired Sustained Attention Impairment: Verbal complex(Serial 7s: 2/3) Memory: Appears intact(Immediate: 5/5; Delayed: 4/5) Awareness: Appears intact Problem Solving: Appears intact Executive Function: Reasoning;Sequencing Reasoning: Appears intact(Abstraction: 2/2) Sequencing: Appears intact(Clock drawing: 3./3) Safety/Judgment: Appears intact       Comprehension  Auditory Comprehension Overall Auditory Comprehension: Appears within functional limits for tasks assessed Yes/No Questions: Within Functional Limits Commands: Within Functional Limits(Complex commands- trail completion: 1/1) Conversation: Complex Reading Comprehension Reading Status: Within funtional limits    Expression Expression Primary Mode of Expression: Verbal Verbal Expression Overall Verbal Expression: Appears within functional limits for tasks assessed Initiation: No impairment Level of Generative/Spontaneous Verbalization: Conversation Repetition: No impairment(Sentence completion: 2/2) Naming: No impairment(Confrontational: 3/3; Divegent naming: 1/1) Pragmatics: No impairment Written Expression Dominant Hand: Right Written Expression: (Copying cube: 1/1)   Oral / Motor  Oral Motor/Sensory Function Overall Oral Motor/Sensory Function: Within functional limits Motor Speech Overall Motor Speech: Appears within functional limits for tasks assessed Respiration: Within functional limits Phonation: Normal Resonance: Within functional limits Articulation: Within functional limitis Intelligibility: Intelligible Motor Planning: Witnin  functional limits Motor Speech Errors: Not applicable   Jera Headings I. Vear Clock, MS, CCC-SLP Acute Rehabilitation Services Office number [phone] Pager [phone]             Scheryl Marten 12/20/2018, 9:57 AM

## 2018-12-20 NOTE — Plan of Care (Addendum)
  RD consulted for nutrition education regarding diabetes.   Lab Results  Component Value Date   HGBA1C 12.5 (H) 12/20/2018    RD working remotely. Patient usual diet recall obtained via inpatient room phone. Discussed different food groups and their effects on blood sugar, emphasizing carbohydrate-containing foods. Discussed carbohydrates and recommended serving sizes of common foods.  Discussed importance of controlled and consistent carbohydrate intake throughout the day. Provided examples of ways to balance meals/snacks and encouraged intake of high-fiber, whole grain complex carbohydrates. Teach back method used. RD to mail pt diet education handout "Counting Carbohydrates for People with Diabetes" from the Academy of Nutrition and Dietetics Manual.   Expect good compliance.  Body mass index is 27.98 kg/m. Pt meets criteria for overweight based on current BMI.  Current diet order is heart healthy/carbohydrate modified, patient is consuming approximately 100% of meals at this time. Labs and medications reviewed. No further nutrition interventions warranted at this time. RD contact information provided. If additional nutrition issues arise, please re-consult RD.  Roslyn Smiling, MS, RD, LDN Pager # [phone] After hours/ weekend pager # [phone]

## 2018-12-20 NOTE — Discharge Instructions (Signed)
Follow with Primary MD Doreene Nest, NP in 7 days   Get CBC, CMP,  checked  by Primary MD next visit.    Activity: As tolerated with Full fall precautions use walker/cane & assistance as needed   Disposition Home    Diet: Heart Healthy , carb modified , with feeding assistance and aspiration precautions.  For Heart failure patients - Check your Weight same time everyday, if you gain over 2 pounds, or you develop in leg swelling, experience more shortness of breath or chest pain, call your Primary MD immediately. Follow Cardiac Low Salt Diet and 1.5 lit/day fluid restriction.   On your next visit with your primary care physician please Get Medicines reviewed and adjusted.   Please request your Prim.MD to go over all Hospital Tests and Procedure/Radiological results at the follow up, please get all Hospital records sent to your Prim MD by signing hospital release before you go home.   If you experience worsening of your admission symptoms, develop shortness of breath, life threatening emergency, suicidal or homicidal thoughts you must seek medical attention immediately by calling 911 or calling your MD immediately  if symptoms less severe.  You Must read complete instructions/literature along with all the possible adverse reactions/side effects for all the Medicines you take and that have been prescribed to you. Take any new Medicines after you have completely understood and accpet all the possible adverse reactions/side effects.   Do not drive, operating heavy machinery, perform activities at heights, swimming or participation in water activities or provide baby sitting services if your were admitted for syncope or siezures until you have seen by Primary MD or a Neurologist and advised to do so again.  Do not drive when taking Pain medications.    Do not take more than prescribed Pain, Sleep and Anxiety Medications  Special Instructions: If you have smoked or chewed Tobacco  in  the last 2 yrs please stop smoking, stop any regular Alcohol  and or any Recreational drug use.  Wear Seat belts while driving.   Please note  You were cared for by a hospitalist during your hospital stay. If you have any questions about your discharge medications or the care you received while you were in the hospital after you are discharged, you can call the unit and asked to speak with the hospitalist on call if the hospitalist that took care of you is not available. Once you are discharged, your primary care physician will handle any further medical issues. Please note that NO REFILLS for any discharge medications will be authorized once you are discharged, as it is imperative that you return to your primary care physician (or establish a relationship with a primary care physician if you do not have one) for your aftercare needs so that they can reassess your need for medications and monitor your lab values.

## 2018-12-20 NOTE — Progress Notes (Signed)
  Echocardiogram 2D Echocardiogram has been performed.  Haley Gomez 12/20/2018, 9:05 AM

## 2018-12-20 NOTE — ED Notes (Signed)
[facility] called for transport. 

## 2018-12-20 NOTE — Progress Notes (Signed)
PROGRESS NOTE                                                                                                                                                                                                             Patient Demographics:    Haley Gomez, is a 53 y.o. female, DOB - 09/05/1966, ZOX:[id number]  Admit date - 12/19/2018   Admitting Physician Rometta Emery, MD  Outpatient Primary MD for the patient is Doreene Nest, NP  LOS - 1   Chief Complaint  Patient presents with   Tingling   Back Pain       Brief Narrative    Haley Gomez is a 53 y.o. female with history of DB and HTN presenting with L face and arm numbness.,  Her work-up significant for acute CVA    Subjective:    Haley Gomez today has, No headache, No chest pain, No abdominal pain - No Nausea, reports left-sided tingling and numbness with no changes, No Cough - SOB.   Assessment  & Plan :    Principal Problem:   Right thalamic infarction [facility]) Active Problems:   Type 2 diabetes mellitus (HCC)   Hyperlipidemia   Leucocytosis   Acute CVA -Evidence of right thalamic infarction, this is most likely in the setting of poorly controlled diabetes and hyperlipidemia, CTA head and neck was obtained, tends of stable narrowing of right P2 PCA, LDL is elevated at 199, her A1c is elevated at 12.5, allergy input appreciated, no indication for dual antiplatelet therapy, aspirin and Plavix for 3 weeks, then aspirin alone, she was seen by PT/OT. -The echo with preserved EF and no embolic source  Hyperlipidemia -DL is 811, goal is less than 70, will increase Lipitor from 20 to 80 mg  Diabetes mellitus type 2, poorly controlled -A1c is 12.5, poorly controlled, it was resistant to start insulin initially, but after discussion she is agreeable, will start on Lantus 8 units, continue with her home Glucotrol dose currently, resume metformin after 72 hours as she  received IV contrast today, continue with insulin sliding scale as well.    Code Status : Full  Family Communication  : none at ebdside  Disposition Plan  : home  Consults  :  Neurolgoy  Procedures  : None  DVT Prophylaxis  :  Parmele  lovenox  Lab Results  Component Value Date   PLT 377 12/20/2018    Antibiotics  :    Anti-infectives (From admission, onward)   None        Objective:   Vitals:   12/20/18 0525 12/20/18 0805 12/20/18 0812 12/20/18 1141  BP: 131/75  126/81 114/72  Pulse: 83 70 97 93  Resp: 18  18 17   Temp: 98.4 F (36.9 C)  97.6 F (36.4 C) 98.4 F (36.9 C)  TempSrc: Oral  Oral Oral  SpO2: 99% 100% 99% 99%  Weight:      Height:        Wt Readings from Last 3 Encounters:  12/20/18 69.4 kg  02/10/18 74.8 kg  06/17/15 74.8 kg     Intake/Output Summary (Last 24 hours) at 12/20/2018 1317 Last data filed at 12/20/2018 0810 Gross per 24 hour  Intake 289 ml  Output --  Net 289 ml     Physical Exam  Awake Alert, Oriented X 3,  Symmetrical Chest wall movement, Good air movement bilaterally, CTAB RRR,No Gallops,Rubs or new Murmurs, No Parasternal Heave +ve B.Sounds, Abd Soft, No tenderness, No Cyanosis, Clubbing or edema,     Data Review:    CBC Recent Labs  Lab 12/19/18 2104 12/20/18 0212  WBC 12.4* 11.0*  HGB 13.2 12.7  HCT 43.4 39.7  PLT 397 377  MCV 82.2 77.8*  MCH 25.0* 24.9*  MCHC 30.4 32.0  RDW 14.3 13.9  LYMPHSABS 1.3  --   MONOABS 0.3  --   EOSABS 0.0  --   BASOSABS 0.1  --     Chemistries  Recent Labs  Lab 12/19/18 2104 12/20/18 0212  NA 135  --   K 4.1  --   CL 100  --   CO2 22  --   GLUCOSE 300*  --   BUN 12  --   CREATININE 0.44 0.55  CALCIUM 9.4  --    ------------------------------------------------------------------------------------------------------------------ Recent Labs    12/20/18 0212  CHOL 249*  HDL 40*  LDLCALC 199*  TRIG 50  CHOLHDL 6.2    Lab Results  Component Value Date    HGBA1C 12.5 (H) 12/20/2018   ------------------------------------------------------------------------------------------------------------------ No results for input(s): TSH, T4TOTAL, T3FREE, THYROIDAB in the last 72 hours.  Invalid input(s): FREET3 ------------------------------------------------------------------------------------------------------------------ No results for input(s): VITAMINB12, FOLATE, FERRITIN, TIBC, IRON, RETICCTPCT in the last 72 hours.  Coagulation profile No results for input(s): INR, PROTIME in the last 168 hours.  No results for input(s): DDIMER in the last 72 hours.  Cardiac Enzymes No results for input(s): CKMB, TROPONINI, MYOGLOBIN in the last 168 hours.  Invalid input(s): CK ------------------------------------------------------------------------------------------------------------------ No results found for: BNP  Inpatient Medications  Scheduled Meds:  [START ON 12/21/2018] aspirin EC  81 mg Oral Daily   atorvastatin  80 mg Oral q1800   clopidogrel  75 mg Oral Daily   enoxaparin (LOVENOX) injection  40 mg Subcutaneous Daily   insulin aspart  0-15 Units Subcutaneous TID WC   insulin aspart  0-5 Units Subcutaneous QHS   insulin glargine  8 Units Subcutaneous Daily   living well with diabetes book   Does not apply Once   perflutren lipid microspheres (DEFINITY) IV suspension       Continuous Infusions:  sodium chloride 100 mL/hr at 12/20/18 0400   PRN Meds:.acetaminophen **OR** acetaminophen (TYLENOL) oral liquid 160 mg/5 mL **OR** acetaminophen, senna-docusate  Micro Results No results found for this or any previous visit (from the past  240 hour(s)).  Radiology Reports Ct Angio Head W Or Wo Contrast  Result Date: 12/20/2018 CLINICAL DATA:  Stroke follow-up. LEFT-sided tingling and numbness. Acute RIGHT thalamic infarct. Stroke risk factors include diabetes mellitus and hyperlipidemia. EXAM: CT ANGIOGRAPHY HEAD AND NECK TECHNIQUE:  Multidetector CT imaging of the head and neck was performed using the standard protocol during bolus administration of intravenous contrast. Multiplanar CT image reconstructions and MIPs were obtained to evaluate the vascular anatomy. Carotid stenosis measurements (when applicable) are obtained utilizing NASCET criteria, using the distal internal carotid diameter as the denominator. CONTRAST:  50mL OMNIPAQUE IOHEXOL 350 MG/ML SOLN COMPARISON:  MR brain 12/19/2018. Intracranial and extracranial MRA 02/10/2018. FINDINGS: CTA NECK FINDINGS Aortic arch: Standard branching. Imaged portion shows no evidence of aneurysm or dissection. No significant stenosis of the major arch vessel origins. Right carotid system: No evidence of dissection, stenosis (50% or greater) or occlusion. Left carotid system: No evidence of dissection, stenosis (50% or greater) or occlusion. Vertebral arteries: BILATERAL patent. RIGHT slightly larger. No evidence of dissection, stenosis (50% or greater) or occlusion. Skeleton: Spondylosis. Other neck: No masses. Upper chest: Mild vascular congestion. No pneumothorax or nodule. Review of the MIP images confirms the above findings CTA HEAD FINDINGS Anterior circulation: Calcification of the cavernous internal carotid arteries consistent with cerebrovascular atherosclerotic disease. No significant stenosis, proximal occlusion, aneurysm, or vascular malformation. Posterior circulation: Vertebral arteries are patent. Basilar artery is patent. There is slight narrowing of the proximal P2 segment RIGHT posterior cerebral artery, estimated 50%, not clearly flow reducing. No cerebellar branch occlusion. Venous sinuses: As permitted by contrast timing, patent. Anatomic variants: None of significance. Delayed phase: No abnormal intracranial enhancement. Review of the MIP images confirms the above findings IMPRESSION: No extracranial or intracranial stenosis of significance. Slight narrowing of proximal P2  segment RIGHT PCA, appears similar to priors. Electronically Signed   By: Elsie Stain M.D.   On: 12/20/2018 09:31   Ct Angio Neck W Or Wo Contrast  Result Date: 12/20/2018 CLINICAL DATA:  Stroke follow-up. LEFT-sided tingling and numbness. Acute RIGHT thalamic infarct. Stroke risk factors include diabetes mellitus and hyperlipidemia. EXAM: CT ANGIOGRAPHY HEAD AND NECK TECHNIQUE: Multidetector CT imaging of the head and neck was performed using the standard protocol during bolus administration of intravenous contrast. Multiplanar CT image reconstructions and MIPs were obtained to evaluate the vascular anatomy. Carotid stenosis measurements (when applicable) are obtained utilizing NASCET criteria, using the distal internal carotid diameter as the denominator. CONTRAST:  50mL OMNIPAQUE IOHEXOL 350 MG/ML SOLN COMPARISON:  MR brain 12/19/2018. Intracranial and extracranial MRA 02/10/2018. FINDINGS: CTA NECK FINDINGS Aortic arch: Standard branching. Imaged portion shows no evidence of aneurysm or dissection. No significant stenosis of the major arch vessel origins. Right carotid system: No evidence of dissection, stenosis (50% or greater) or occlusion. Left carotid system: No evidence of dissection, stenosis (50% or greater) or occlusion. Vertebral arteries: BILATERAL patent. RIGHT slightly larger. No evidence of dissection, stenosis (50% or greater) or occlusion. Skeleton: Spondylosis. Other neck: No masses. Upper chest: Mild vascular congestion. No pneumothorax or nodule. Review of the MIP images confirms the above findings CTA HEAD FINDINGS Anterior circulation: Calcification of the cavernous internal carotid arteries consistent with cerebrovascular atherosclerotic disease. No significant stenosis, proximal occlusion, aneurysm, or vascular malformation. Posterior circulation: Vertebral arteries are patent. Basilar artery is patent. There is slight narrowing of the proximal P2 segment RIGHT posterior cerebral  artery, estimated 50%, not clearly flow reducing. No cerebellar branch occlusion. Venous sinuses: As permitted by contrast  timing, patent. Anatomic variants: None of significance. Delayed phase: No abnormal intracranial enhancement. Review of the MIP images confirms the above findings IMPRESSION: No extracranial or intracranial stenosis of significance. Slight narrowing of proximal P2 segment RIGHT PCA, appears similar to priors. Electronically Signed   By: Elsie Stain M.D.   On: 12/20/2018 09:31   Mr Brain Wo Contrast  Result Date: 12/19/2018 CLINICAL DATA:  Left-sided tingling and numbness EXAM: MRI HEAD WITHOUT CONTRAST TECHNIQUE: Multiplanar, multiecho pulse sequences of the brain and surrounding structures were obtained without intravenous contrast. COMPARISON:  Brain MRI 02/10/2018 FINDINGS: BRAIN: Small acute infarct of the dorsolateral right thalamus. The midline structures are normal. No midline shift or other mass effect. The white matter signal is normal for the patient's age. The cerebral and cerebellar volume are age-appropriate. No hydrocephalus. Susceptibility-sensitive sequences show no chronic microhemorrhage or superficial siderosis. No mass lesion. VASCULAR: The major intracranial arterial and venous sinus flow voids are normal. SKULL AND UPPER CERVICAL SPINE: Calvarial bone marrow signal is normal. There is no skull base mass. Visualized upper cervical spine and soft tissues are normal. SINUSES/ORBITS: No fluid levels or advanced mucosal thickening. No mastoid or middle ear effusion. The orbits are normal. IMPRESSION: 1. Small acute infarct of the dorsolateral right thalamus, in close proximity to the posterior limb of the internal capsule, in keeping with the reported left-sided symptoms. 2. Otherwise normal MRI of the brain. Electronically Signed   By: Deatra Robinson M.D.   On: 12/19/2018 22:18      Huey Bienenstock M.D on 12/20/2018 at 1:17 PM  Between 7am to 7pm - Pager -  [phone]  After 7pm go to [email or web address] - password [facility]  [facility] -  Office  [phone]

## 2018-12-20 NOTE — Progress Notes (Addendum)
Inpatient Diabetes Program Recommendations  AACE/ADA: New Consensus Statement on Inpatient Glycemic Control (2015)  Target Ranges:  Prepandial:   less than 140 mg/dL      Peak postprandial:   less than 180 mg/dL (1-2 hours)      Critically ill patients:  140 - 180 mg/dL   Lab Results  Component Value Date   GLUCAP 266 (H) 12/20/2018   HGBA1C 12.5 (H) 12/20/2018     Review of Glycemic Control  Diabetes history: DM2  Outpatient Diabetes medications: Glucophage '1000mg'$  BID                                                         Glucotrol 10 mg daily  Current orders for Inpatient glycemic control: Lantus 8 units daily                                                                           Novolog moderate correction (0-15 units) tid / hs    Met with patient to discuss diabetes management at home. She states she knew her Hgb A1c was going to be way too high as she wasn't taking care of herself as well as she could have due to increased stress at home over the past year. 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 Many fast food meals and often forgets to take her evening dose of Glucophage. Patient did ask if she might be able to have her Glucophage changed to once a day XR as an outpatient as she does forget to take it in the evenings at times.   Spoke with patient about current Hgb A1c of 12.5% which averages 312 mg/dl. Explained what an A1c is and what it measures. Reminded patient that goal A1c is 7% or less per ADA standards to prevent both acute and long-term complications. Explained to patient the extreme importance of good glucose control at home. Encouraged patient to check  CBGs at least bid at home (fasting and another check within the day) and to record all CBGs in a logbook. She states she does need a new glucometer (MD has already added to d/c scripts.)  She states she had not seen her former PCP in about a year and did have enough refills on her oral meds until now. Case manager has arranged a new PCP for patient and I stressed importance of establishing care and following up with PCP for her diabetes care. Regarding diet education ordered inpatient dietician consult for patient (done) as well as outpatient ([place]). She was appreciative of the information.    Ordered "Living well with diabetes" book for patient and reviewed s/s of hypoglycemia. Ordered "insulin pen starter kit" as patient is going home on daily lantus in addition to her oral meds. Reviewed the steps for self-administering insulin pen. 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 Note Lantus solostar PEN (patient's preference) ordered for discharge.   MD please order the following insulin pen needles for discharge:   Insulin pen needles (Epic # [phone]).    -- Will follow during hospitalization.--  Jonna Clark RN, MSN Diabetes Coordinator Inpatient Glycemic Control Team Team Pager: [phone] (8am-5pm)

## 2018-12-20 NOTE — Progress Notes (Signed)
Occupational Therapy Evaluation Patient Details Name: Haley Gomez MRN: [id number] DOB: [birth year] Today's Date: 12/20/2018    History of Present Illness Pt is a 53 y/o female admitted secondary to L sided numbness. MRI revealed a small right thalamic infarction. PMH including but not limited to DM and hyperlipidemia.   Clinical Impression   PTA, pt was living at home, and was independent with ADL/IADL and functional mobility. Pt currently modified independent with ADL and functional mobility. Pt reports decreased sensation in LUE and left side of face. Pt's proprioception WNL, pt demonstrates decreased light touch. Educated pt on compensatory strategies and exercises to increase input to LUE to progress toward baseline. Pt functioning close to baseline, anticipate she will continue to progress upon return home. No additional OT needs identified, all education complete, pt with no additional questions. OT to sign off.   Follow Up Recommendations  No OT follow up    Equipment Recommendations       Recommendations for Other Services       Precautions / Restrictions Precautions Precautions: None Restrictions Weight Bearing Restrictions: No      Mobility Bed Mobility Overal bed mobility: Independent                Transfers Overall transfer level: Independent                    Balance                                 Standardized Balance Assessment Standardized Balance Assessment : Dynamic Gait Index   Dynamic Gait Index Level Surface: Normal Change in Gait Speed: Normal Gait with Horizontal Head Turns: Normal Gait with Vertical Head Turns: Normal Gait and Pivot Turn: Normal Step Over Obstacle: Normal Step Around Obstacles: Normal Steps: Normal Total Score: 24     ADL either performed or assessed with clinical judgement   ADL Overall ADL's : Modified independent                                       General ADL  Comments: pt close to baseline;pt limited slightly secondary to numbness in LUE;educated pt on weight bearing, sensory exercises and importance of using LUE to progress toward baseline;pt verbalized understanding      Vision         Perception     Praxis      Pertinent Vitals/Pain Pain Assessment: No/denies pain     Hand Dominance Left   Extremity/Trunk Assessment Upper Extremity Assessment Upper Extremity Assessment: LUE deficits/detail LUE Deficits / Details: able to distinguish different temperatures;proprioception WNL;fine motor coordination limited, pt requires more time and attending to task;decreased sensation to light touch throughout hand and forearm; strength WFL LUE Sensation: decreased light touch LUE Coordination: decreased fine motor   Lower Extremity Assessment Lower Extremity Assessment: Overall WFL for tasks assessed;LLE deficits/detail LLE Deficits / Details: pt with decreased sensation to light touch throughout foot and lower leg; strength was 5/5 grossly throughout   Cervical / Trunk Assessment Cervical / Trunk Assessment: Normal   Communication Communication Communication: No difficulties   Cognition Arousal/Alertness: Awake/alert Behavior During Therapy: WFL for tasks assessed/performed Overall Cognitive Status: Within Functional Limits for tasks assessed  General Comments  Educated pt on compensatory strategies for numbness in RUE and face;    Exercises     Shoulder Instructions      Home Living Family/patient expects to be discharged to:: Private residence Living Arrangements: Children;Parent Available Help at Discharge: Family;Available 24 hours/day Type of Home: House Home Access: Stairs to enter [employer] of Steps: 10 Entrance Stairs-Rails: Right;Left Home Layout: One level     Bathroom Shower/Tub: Tub/shower unit         Home Equipment: None      Lives With:  Family    Prior Functioning/Environment Level of Independence: Independent        Comments: works as a Engineer, civil (consulting) in [employer]        OT Problem List: Impaired UE functional use;Impaired sensation      OT Treatment/Interventions:      OT Goals(Current goals can be found in the care plan section) Acute Rehab OT Goals Patient Stated Goal: to get feeling back in hand OT Goal Formulation: With patient Time For Goal Achievement: 01/03/19 Potential to Achieve Goals: Good  OT Frequency:     Barriers to D/C:            Co-evaluation              AM-PAC OT "6 Clicks" Daily Activity     Outcome Measure Help from another person eating meals?: None Help from another person taking care of personal grooming?: None Help from another person toileting, which includes using toliet, bedpan, or urinal?: None Help from another person bathing (including washing, rinsing, drying)?: None Help from another person to put on and taking off regular upper body clothing?: None Help from another person to put on and taking off regular lower body clothing?: None 6 Click Score: 24   End of Session Nurse Communication: Mobility status  Activity Tolerance: Patient tolerated treatment well Patient left: in bed;with call bell/phone within reach  OT Visit Diagnosis: Other symptoms and signs involving the nervous system (R29.898)                Time: 2707-8675 OT Time Calculation (min): 15 min Charges:  OT General Charges $OT Visit: 1 Visit OT Evaluation $OT Eval Low Complexity: 1 Low   Diona Browner OTR/L Acute Rehabilitation Services Office: [phone]   Rebeca Alert 12/20/2018, 1:05 PM

## 2018-12-20 NOTE — TOC Initial Note (Signed)
Transition of Care [facility]) - Initial/Assessment Note    Patient Details  Name: Haley Gomez MRN: [id number] Date of Birth: [birth year]  Transition of Care [facility]) CM/SW Contact:    Kermit Balo, RN Phone Number: 12/20/2018, 3:16 PM  Clinical Narrative:                 CM provided pt with coupon to assist with cost of Lantus. She will have transportation home tomorrow when d/ced. Pt without PCP--CM was able to get her an appt at [facility] in [place] but not until July (info on AVS). CM in the interim was able to get appt at [facility] and placed on AVS. (pt is aware and in agreement).   Expected Discharge Plan: Home/Self Care Barriers to Discharge: Continued Medical Work up   Patient Goals and CMS Choice Patient states their goals for this hospitalization and ongoing recovery are:: to have decreased stress and be more compliant with her own healthcare      Expected Discharge Plan and Services Expected Discharge Plan: Home/Self Care   Discharge Planning Services: CM Consult   Living arrangements for the past 2 months: Apartment Expected Discharge Date: 12/20/18                        Prior Living Arrangements/Services Living arrangements for the past 2 months: Apartment Lives with:: Parents, Adult Children, Other (Comment)(grandchildren) Patient language and need for interpreter reviewed:: Yes(no needs) Do you feel safe going back to the place where you live?: Yes      Need for Family Participation in Patient Care: No (Comment) Care giver support system in place?: Yes (comment)(pt has family around 38 hours/ day)   Criminal Activity/Legal Involvement Pertinent to Current Situation/Hospitalization: No - Comment as needed  Activities of Daily Living Home Assistive Devices/Equipment: None ADL Screening (condition at time of admission) Patient's cognitive ability adequate to safely complete daily activities?: Yes Is the patient deaf or have difficulty hearing?: No Does the  patient have difficulty seeing, even when wearing glasses/contacts?: No Does the patient have difficulty concentrating, remembering, or making decisions?: No Patient able to express need for assistance with ADLs?: No Does the patient have difficulty dressing or bathing?: No Independently performs ADLs?: Yes (appropriate for developmental age) Does the patient have difficulty walking or climbing stairs?: No Weakness of Legs: None Weakness of Arms/Hands: None  Permission Sought/Granted                  Emotional Assessment Appearance:: Appears stated age Attitude/Demeanor/Rapport: Gracious Affect (typically observed): Accepting, Pleasant, Appropriate Orientation: : Oriented to Self, Oriented to  Time, Oriented to Place, Oriented to Situation   Psych Involvement: No (comment)  Admission diagnosis:  Cerebrovascular accident (CVA), unspecified mechanism (HCC) [I63.9] Patient Active Problem List   Diagnosis Date Noted  . Leucocytosis 12/19/2018  . Right thalamic infarction (HCC) 12/19/2018  . Hot flashes 06/17/2015  . Preventative health care 06/17/2015  . Type 2 diabetes mellitus (HCC) 03/05/2015  . Hyperlipidemia 03/05/2015   PCP:  Doreene Nest, NP Pharmacy:   [facility] - [place], [place] - [address] [place] [address] Phone: [phone] Fax: [phone]  [facility] [address], [place] - [address] [place] [address] Phone: [phone] Fax: [phone]     Social Determinants of Health (SDOH) Interventions    Readmission Risk Interventions No flowsheet data found.

## 2018-12-20 NOTE — Consult Note (Signed)
= Requesting Physician: Dr. Mikeal Hawthorne    Chief Complaint: Left side numbness  History obtained from: Patient and Chart    HPI:                                                                                                                                       Haley Gomez is an 53 y.o. female past medical history of diabetes and hyperlipidemia presents to the ER with numbness over the left side of her face and arm. Patient underwent MRI brain at [facility] which showed a small right thalamic infarction and patient was admitted to hospital service for further evaluation.  She denies any weakness, slurred speech when her symptoms started.  She does not take any aspirin at home.  Date last known well: 3.25.20 Time last known well: 8 am tPA Given: outside window NIHSS: 1 Baseline MRS    Past Medical History:  Diagnosis Date  . Diabetes mellitus without complication (HCC)   . Hyperlipidemia     Past Surgical History:  Procedure Laterality Date  . CESAREAN SECTION    . GALLBLADDER SURGERY  1997    Family History  Problem Relation Age of Onset  . Diabetes Mother   . Alcohol abuse Father   . Hypertension Father   . Kidney disease Father   . Stroke Father    Social History:  reports that she has never smoked. She has never used smokeless tobacco. She reports current alcohol use. She reports that she does not use drugs.  Allergies:  Allergies  Allergen Reactions  . Bactrim [Sulfamethoxazole-Trimethoprim] Nausea Only    Medications:                                                                                                                        I reviewed home medications   ROS:  14 systems reviewed and negative except above    Examination:                                                                                                       General: Appears well-developed  Psych: Affect appropriate to situation Eyes: No scleral injection HENT: No OP obstrucion Head: Normocephalic.  Cardiovascular: Normal rate and regular rhythm.  Respiratory: Effort normal and breath sounds normal to anterior ascultation GI: Soft.  No distension. There is no tenderness.  Skin: WDI    Neurological Examination Mental Status: Alert, oriented, thought content appropriate.  Speech fluent without evidence of aphasia. Able to follow 3 step commands without difficulty. Cranial Nerves: II: Visual fields grossly normal,  III,IV, VI: ptosis not present, extra-ocular motions intact bilaterally, pupils equal, round, reactive to light and accommodation V,VII: smile symmetric, facial light touch sensation normal bilaterally VIII: hearing normal bilaterally IX,X: uvula rises symmetrically XI: bilateral shoulder shrug XII: midline tongue extension Motor: Right : Upper extremity   5/5    Left:     Upper extremity   5/5  Lower extremity   5/5     Lower extremity   5/5 Tone and bulk:normal tone throughout; no atrophy noted Sensory: Sensation to light touch and pinprick over the left arm deep Tendon Reflexes: 2+ and symmetric throughout Plantars: Right: downgoing   Left: downgoing Cerebellar: normal finger-to-nose, normal rapid alternating movements and normal heel-to-shin test Gait: normal gait and station     Lab Results: Basic Metabolic Panel: Recent Labs  Lab 12/19/18 2104 12/20/18 0212  NA 135  --   K 4.1  --   CL 100  --   CO2 22  --   GLUCOSE 300*  --   BUN 12  --   CREATININE 0.44 0.55  CALCIUM 9.4  --     CBC: Recent Labs  Lab 12/19/18 2104 12/20/18 0212  WBC 12.4* 11.0*  NEUTROABS 10.7*  --   HGB 13.2 12.7  HCT 43.4 39.7  MCV 82.2 77.8*  PLT 397 377    Coagulation Studies: No results for input(s): LABPROT, INR in the last 72 hours.  Imaging: Mr Brain Wo Contrast  Result Date: 12/19/2018 CLINICAL  DATA:  Left-sided tingling and numbness EXAM: MRI HEAD WITHOUT CONTRAST TECHNIQUE: Multiplanar, multiecho pulse sequences of the brain and surrounding structures were obtained without intravenous contrast. COMPARISON:  Brain MRI 02/10/2018 FINDINGS: BRAIN: Small acute infarct of the dorsolateral right thalamus. The midline structures are normal. No midline shift or other mass effect. The white matter signal is normal for the patient's age. The cerebral and cerebellar volume are age-appropriate. No hydrocephalus. Susceptibility-sensitive sequences show no chronic microhemorrhage or superficial siderosis. No mass lesion. VASCULAR: The major intracranial arterial and venous sinus flow voids are normal. SKULL AND UPPER CERVICAL SPINE: Calvarial bone marrow signal is normal. There is no skull base mass. Visualized upper cervical spine and soft tissues are normal. SINUSES/ORBITS: No fluid levels or advanced mucosal thickening. No mastoid or middle ear effusion. The orbits are normal. IMPRESSION: 1. Small acute infarct of  the dorsolateral right thalamus, in close proximity to the posterior limb of the internal capsule, in keeping with the reported left-sided symptoms. 2. Otherwise normal MRI of the brain. Electronically Signed   By: Deatra Robinson M.D.   On: 12/19/2018 22:18     ASSESSMENT AND PLAN   54 y.o. female past medical history of diabetes and hyperlipidemia presents to the ER with numbness over the left side of her face and arm.   Acute right thalamic infarction  Risk factors: Diabetes mellitus, hyperlipidemia Etiology: Small vessel disease  Recommend #MRA Head and neck  #Transthoracic Echo  #Patient on Plavix 75 mg daily and aspirin 81 mg daily (recommended Plavix load of 300 mg) #Start  Atorvastatin 40 mg/other high intensity statin # BP goal: permissive HTN upto 220/120 mmHg  # HBAIC and Lipid profile # Telemetry monitoring # Frequent neuro checks #  stroke swallow screen  Please page  stroke NP  Or  PA  Or MD from 8am -4 pm  as this patient from this time will be  followed by the stroke.   You can look them up on [email or web address]  Password [facility]       [facility] Pager Number [phone]

## 2018-12-20 NOTE — Progress Notes (Signed)
STROKE TEAM PROGRESS NOTE   INTERVAL HISTORY Her family is not at the bedside.  Patient sitting in bed, stated that her left-sided numbness much improved but still not gone yet.  She was educated on medication treatment of diabetes and high cholesterol.  Vitals:   12/20/18 0325 12/20/18 0525 12/20/18 0805 12/20/18 0812  BP: 107/62 131/75  126/81  Pulse: 80 83 70 97  Resp: 18 18  18   Temp: 98.2 F (36.8 C) 98.4 F (36.9 C)  97.6 F (36.4 C)  TempSrc: Oral Oral  Oral  SpO2: 100% 99% 100% 99%  Weight:      Height:        CBC:  Recent Labs  Lab 12/19/18 2104 12/20/18 0212  WBC 12.4* 11.0*  NEUTROABS 10.7*  --   HGB 13.2 12.7  HCT 43.4 39.7  MCV 82.2 77.8*  PLT 397 377    Basic Metabolic Panel:  Recent Labs  Lab 12/19/18 2104 12/20/18 0212  NA 135  --   K 4.1  --   CL 100  --   CO2 22  --   GLUCOSE 300*  --   BUN 12  --   CREATININE 0.44 0.55  CALCIUM 9.4  --    Lipid Panel:     Component Value Date/Time   CHOL 249 (H) 12/20/2018 0212   TRIG 50 12/20/2018 0212   HDL 40 (L) 12/20/2018 0212   CHOLHDL 6.2 12/20/2018 0212   VLDL 10 12/20/2018 0212   LDLCALC 199 (H) 12/20/2018 0212   HgbA1c:  Lab Results  Component Value Date   HGBA1C 12.5 (H) 12/20/2018   Urine Drug Screen: No results found for: LABOPIA, COCAINSCRNUR, LABBENZ, AMPHETMU, THCU, LABBARB  Alcohol Level No results found for: ETH  IMAGING Ct Angio Head W Or Wo Contrast  Result Date: 12/20/2018 CLINICAL DATA:  Stroke follow-up. LEFT-sided tingling and numbness. Acute RIGHT thalamic infarct. Stroke risk factors include diabetes mellitus and hyperlipidemia. EXAM: CT ANGIOGRAPHY HEAD AND NECK TECHNIQUE: Multidetector CT imaging of the head and neck was performed using the standard protocol during bolus administration of intravenous contrast. Multiplanar CT image reconstructions and MIPs were obtained to evaluate the vascular anatomy. Carotid stenosis measurements (when applicable) are obtained  utilizing NASCET criteria, using the distal internal carotid diameter as the denominator. CONTRAST:  50mL OMNIPAQUE IOHEXOL 350 MG/ML SOLN COMPARISON:  MR brain 12/19/2018. Intracranial and extracranial MRA 02/10/2018. FINDINGS: CTA NECK FINDINGS Aortic arch: Standard branching. Imaged portion shows no evidence of aneurysm or dissection. No significant stenosis of the major arch vessel origins. Right carotid system: No evidence of dissection, stenosis (50% or greater) or occlusion. Left carotid system: No evidence of dissection, stenosis (50% or greater) or occlusion. Vertebral arteries: BILATERAL patent. RIGHT slightly larger. No evidence of dissection, stenosis (50% or greater) or occlusion. Skeleton: Spondylosis. Other neck: No masses. Upper chest: Mild vascular congestion. No pneumothorax or nodule. Review of the MIP images confirms the above findings CTA HEAD FINDINGS Anterior circulation: Calcification of the cavernous internal carotid arteries consistent with cerebrovascular atherosclerotic disease. No significant stenosis, proximal occlusion, aneurysm, or vascular malformation. Posterior circulation: Vertebral arteries are patent. Basilar artery is patent. There is slight narrowing of the proximal P2 segment RIGHT posterior cerebral artery, estimated 50%, not clearly flow reducing. No cerebellar branch occlusion. Venous sinuses: As permitted by contrast timing, patent. Anatomic variants: None of significance. Delayed phase: No abnormal intracranial enhancement. Review of the MIP images confirms the above findings IMPRESSION: No extracranial or intracranial stenosis  of significance. Slight narrowing of proximal P2 segment RIGHT PCA, appears similar to priors. Electronically Signed   By: Elsie Stain M.D.   On: 12/20/2018 09:31   Ct Angio Neck W Or Wo Contrast  Result Date: 12/20/2018 CLINICAL DATA:  Stroke follow-up. LEFT-sided tingling and numbness. Acute RIGHT thalamic infarct. Stroke risk factors  include diabetes mellitus and hyperlipidemia. EXAM: CT ANGIOGRAPHY HEAD AND NECK TECHNIQUE: Multidetector CT imaging of the head and neck was performed using the standard protocol during bolus administration of intravenous contrast. Multiplanar CT image reconstructions and MIPs were obtained to evaluate the vascular anatomy. Carotid stenosis measurements (when applicable) are obtained utilizing NASCET criteria, using the distal internal carotid diameter as the denominator. CONTRAST:  29mL OMNIPAQUE IOHEXOL 350 MG/ML SOLN COMPARISON:  MR brain 12/19/2018. Intracranial and extracranial MRA 02/10/2018. FINDINGS: CTA NECK FINDINGS Aortic arch: Standard branching. Imaged portion shows no evidence of aneurysm or dissection. No significant stenosis of the major arch vessel origins. Right carotid system: No evidence of dissection, stenosis (50% or greater) or occlusion. Left carotid system: No evidence of dissection, stenosis (50% or greater) or occlusion. Vertebral arteries: BILATERAL patent. RIGHT slightly larger. No evidence of dissection, stenosis (50% or greater) or occlusion. Skeleton: Spondylosis. Other neck: No masses. Upper chest: Mild vascular congestion. No pneumothorax or nodule. Review of the MIP images confirms the above findings CTA HEAD FINDINGS Anterior circulation: Calcification of the cavernous internal carotid arteries consistent with cerebrovascular atherosclerotic disease. No significant stenosis, proximal occlusion, aneurysm, or vascular malformation. Posterior circulation: Vertebral arteries are patent. Basilar artery is patent. There is slight narrowing of the proximal P2 segment RIGHT posterior cerebral artery, estimated 50%, not clearly flow reducing. No cerebellar branch occlusion. Venous sinuses: As permitted by contrast timing, patent. Anatomic variants: None of significance. Delayed phase: No abnormal intracranial enhancement. Review of the MIP images confirms the above findings IMPRESSION: No  extracranial or intracranial stenosis of significance. Slight narrowing of proximal P2 segment RIGHT PCA, appears similar to priors. Electronically Signed   By: Elsie Stain M.D.   On: 12/20/2018 09:31   Mr Brain Wo Contrast  Result Date: 12/19/2018 CLINICAL DATA:  Left-sided tingling and numbness EXAM: MRI HEAD WITHOUT CONTRAST TECHNIQUE: Multiplanar, multiecho pulse sequences of the brain and surrounding structures were obtained without intravenous contrast. COMPARISON:  Brain MRI 02/10/2018 FINDINGS: BRAIN: Small acute infarct of the dorsolateral right thalamus. The midline structures are normal. No midline shift or other mass effect. The white matter signal is normal for the patient's age. The cerebral and cerebellar volume are age-appropriate. No hydrocephalus. Susceptibility-sensitive sequences show no chronic microhemorrhage or superficial siderosis. No mass lesion. VASCULAR: The major intracranial arterial and venous sinus flow voids are normal. SKULL AND UPPER CERVICAL SPINE: Calvarial bone marrow signal is normal. There is no skull base mass. Visualized upper cervical spine and soft tissues are normal. SINUSES/ORBITS: No fluid levels or advanced mucosal thickening. No mastoid or middle ear effusion. The orbits are normal. IMPRESSION: 1. Small acute infarct of the dorsolateral right thalamus, in close proximity to the posterior limb of the internal capsule, in keeping with the reported left-sided symptoms. 2. Otherwise normal MRI of the brain. Electronically Signed   By: Deatra Robinson M.D.   On: 12/19/2018 22:18    PHYSICAL EXAM  Temp:  [97.6 F (36.4 C)-98.4 F (36.9 C)] 97.6 F (36.4 C) (03/26 0812) Pulse Rate:  [70-102] 97 (03/26 0812) Resp:  [17-20] 18 (03/26 0812) BP: (107-155)/(62-88) 126/81 (03/26 0812) SpO2:  [95 %-100 %]  99 % (03/26 0812) Weight:  [69.4 kg-73.9 kg] 69.4 kg (03/26 0125)  General - Well nourished, well developed, in no apparent distress.  Ophthalmologic - fundi  not visualized due to noncooperation.  Cardiovascular - Regular rate and rhythm.  Mental Status -  Level of arousal and orientation to time, place, and person were intact. Language including expression, naming, repetition, comprehension was assessed and found intact. Attention span and concentration were normal. Fund of Knowledge was assessed and was intact.  Cranial Nerves II - XII - II - Visual field intact OU. III, IV, VI - Extraocular movements intact. V - Facial sensation decreased on the left, about 90% of the right. VII - Facial movement intact bilaterally. VIII - Hearing & vestibular intact bilaterally. X - Palate elevates symmetrically. XI - Chin turning & shoulder shrug intact bilaterally. XII - Tongue protrusion intact.  Motor Strength - The patient's strength was normal in all extremities and pronator drift was absent.  Bulk was normal and fasciculations were absent.   Motor Tone - Muscle tone was assessed at the neck and appendages and was normal.  Reflexes - The patient's reflexes were symmetrical in all extremities and she had no pathological reflexes.  Sensory - Light touch, temperature/pinprick were assessed and were decreased on the left, about 90% of the right.    Coordination - The patient had normal movements in the hands and feet with no ataxia or dysmetria.  Tremor was absent.  Gait and Station - deferred.   ASSESSMENT/PLAN Haley Gomez is a 53 y.o. female with history of DB and HTN presenting with L face and arm numbness.   Stroke:  right thalamic infarct secondary to small vessel disease source  MRI  Small R thalamic infarct  CTA head & neck no sign stenosis. Slight proximal R P2 narrowing  2D Echo EF 60 to 65%  LDL 199  HgbA1c 12.5  Lovenox 40 mg sq daily for VTE prophylaxis  No antithrombotic prior to admission, now on aspirin 81 mg daily and clopidogrel 75 mg daily following load. Given mild stroke, recommend aspirin 81 mg and  plavix 75 mg daily x 3 weeks, then aspirin alone.   Therapy recommendations:  None  Disposition:  Likely home today  Hypertension  Stable . Permissive hypertension (OK if < 220/120) but gradually normalize in 2-3 days . Long-term BP goal normotensive  Hyperlipidemia  Home meds:  lipitor 20  Increased to lipitor 80 on arrival to hospital  LDL 199, goal < 70  Continue statin at discharge  Diabetes type II, uncontrolled  HgbA1c 12.5, goal < 7.0  CBGs  SSI  Needs close PCP follow up for better DM control  On metformin at home, currently agree with insulin therapy  Other Stroke Risk Factors  ETOH use, advised to drink no more than 1 drink(s) a day  Overweight, Body mass index is 27.98 kg/m., recommend weight loss, diet and exercise as appropriate   Family hx stroke (father)  Other Active Problems  Leukocytosis 12.4->11.0  Hospital day # 1  Neurology will sign off. Please call with questions. Pt will follow up with stroke clinic NP at [facility] in about 4 weeks. Thanks for the consult.  Marvel Plan, MD PhD Stroke Neurology 12/20/2018 12:58 PM   To contact Stroke Continuity provider, please refer to [email or web address]. After hours, contact General Neurology

## 2018-12-20 NOTE — Evaluation (Signed)
Physical Therapy Evaluation & Discharge Patient Details Name: Haley Gomez MRN: [id number] DOB: 02/15/1966 Today's Date: 12/20/2018   History of Present Illness  Pt is a 53 y/o female admitted secondary to L sided numbness. MRI revealed a small right thalamic infarction. PMH including but not limited to DM and hyperlipidemia.    Clinical Impression  Pt presented supine in bed with HOB elevated, awake and willing to participate in therapy session. Prior to admission, pt reported that she was independent with all functional mobility and ADLs. Pt works as a Engineer, civil (consulting). She lives with her family in a single level home with ten steps to enter. She is currently at supervision to independent with all functional mobility including stair negotiation. Pt participated in a higher level balance assessment and scored a 24/24 on the DGI, indicating that she is a safe community ambulator. PT answered all of pt's questions at end of session. No further acute PT needs identified at this time. PT signing off.     Follow Up Recommendations No PT follow up    Equipment Recommendations  None recommended by PT    Recommendations for Other Services       Precautions / Restrictions Precautions Precautions: None Restrictions Weight Bearing Restrictions: No      Mobility  Bed Mobility Overal bed mobility: Independent                Transfers Overall transfer level: Independent                  Ambulation/Gait Ambulation/Gait assistance: Supervision Gait Distance (Feet): 400 Feet Assistive device: None Gait Pattern/deviations: Step-through pattern;Drifts right/left Gait velocity: able to fluctuate   General Gait Details: pt with mild instability with higher level balance tasks; however, no overt LOB or need for physical assistance  Stairs Stairs: Yes Stairs assistance: Supervision Stair Management: No rails;Alternating pattern;Forwards Number of Stairs: 3    Wheelchair  Mobility    Modified Rankin (Stroke Patients Only) Modified Rankin (Stroke Patients Only) Pre-Morbid Rankin Score: No symptoms Modified Rankin: No significant disability     Balance                                 Standardized Balance Assessment Standardized Balance Assessment : Dynamic Gait Index   Dynamic Gait Index Level Surface: Normal Change in Gait Speed: Normal Gait with Horizontal Head Turns: Normal Gait with Vertical Head Turns: Normal Gait and Pivot Turn: Normal Step Over Obstacle: Normal Step Around Obstacles: Normal Steps: Normal Total Score: 24       Pertinent Vitals/Pain Pain Assessment: No/denies pain    Home Living Family/patient expects to be discharged to:: Private residence Living Arrangements: Children;Parent Available Help at Discharge: Family;Available 24 hours/day Type of Home: House Home Access: Stairs to enter Entrance Stairs-Rails: Doctor, general practice of Steps: 10 Home Layout: One level Home Equipment: None      Prior Function Level of Independence: Independent         Comments: works as a Engineer, civil (consulting) in [employer]   Dominant Hand: Left    Extremity/Trunk Assessment   Upper Extremity Assessment Upper Extremity Assessment: Defer to OT evaluation;LUE deficits/detail LUE Deficits / Details: decreased sensation to light touch throughout hand and forearm; strength WFL    Lower Extremity Assessment Lower Extremity Assessment: Overall WFL for tasks assessed;LLE deficits/detail LLE Deficits / Details: pt with decreased sensation to light touch  throughout foot and lower leg; strength was 5/5 grossly throughout    Cervical / Trunk Assessment Cervical / Trunk Assessment: Normal  Communication   Communication: No difficulties  Cognition Arousal/Alertness: Awake/alert Behavior During Therapy: WFL for tasks assessed/performed Overall Cognitive Status: Within Functional Limits for tasks  assessed                                        General Comments      Exercises     Assessment/Plan    PT Assessment Patent does not need any further PT services  PT Problem List         PT Treatment Interventions      PT Goals (Current goals can be found in the Care Plan section)  Acute Rehab PT Goals Patient Stated Goal: "go home" PT Goal Formulation: All assessment and education complete, DC therapy    Frequency     Barriers to discharge        Co-evaluation               AM-PAC PT "6 Clicks" Mobility  Outcome Measure Help needed turning from your back to your side while in a flat bed without using bedrails?: None Help needed moving from lying on your back to sitting on the side of a flat bed without using bedrails?: None Help needed moving to and from a bed to a chair (including a wheelchair)?: None Help needed standing up from a chair using your arms (e.g., wheelchair or bedside chair)?: None Help needed to walk in hospital room?: None Help needed climbing 3-5 steps with a railing? : None 6 Click Score: 24    End of Session   Activity Tolerance: Patient tolerated treatment well Patient left: in bed;with call bell/phone within reach Nurse Communication: Mobility status PT Visit Diagnosis: Other symptoms and signs involving the nervous system (R29.898)    Time: 1001-1016 PT Time Calculation (min) (ACUTE ONLY): 15 min   Charges:   PT Evaluation $PT Eval Moderate Complexity: 1 Mod          Deborah Chalk, PT, DPT  Acute Rehabilitation Services Pager [phone] Office [phone]    Alessandra Bevels Genoveva Singleton 12/20/2018, 11:05 AM

## 2018-12-21 LAB — GLUCOSE, CAPILLARY
Glucose-Capillary: 197 mg/dL — ABNORMAL HIGH (ref 70–99)
Glucose-Capillary: 265 mg/dL — ABNORMAL HIGH (ref 70–99)

## 2018-12-21 MED ORDER — INSULIN PEN NEEDLE 32G X 4 MM MISC: 1 refills | Status: DC

## 2018-12-21 MED ORDER — METFORMIN HCL 1000 MG PO TABS: 1000.0000 mg | ORAL_TABLET | Freq: Two times a day (BID) | ORAL | 3 refills | Status: DC

## 2018-12-21 MED ORDER — ASPIRIN 81 MG PO TBEC: 81.0000 mg | DELAYED_RELEASE_TABLET | Freq: Every day | ORAL | 3 refills | Status: DC

## 2018-12-21 MED ORDER — INSULIN GLARGINE 100 UNIT/ML SOLOSTAR PEN: 10.0000 [IU] | PEN_INJECTOR | Freq: Every day | SUBCUTANEOUS | 3 refills | Status: DC

## 2018-12-21 MED ORDER — INSULIN GLARGINE 100 UNIT/ML ~~LOC~~ SOLN
10.0000 [IU] | Freq: Every day | SUBCUTANEOUS | Status: DC
  Administered 2018-12-21: 10 [IU] via SUBCUTANEOUS
  Filled 2018-12-21: qty 0.1

## 2018-12-21 NOTE — Discharge Summary (Signed)
Haley Gomez, is a 53 y.o. female  DOB 03-31-1966  MRN [id number].  Admission date:  12/19/2018  Admitting Physician  Elwyn Reach, MD  Discharge Date:  12/21/2018   Primary MD  Pleas Koch, NP  Recommendations for primary care physician for things to follow:  -Please check CBC, BMP during next visit, her A1c closely, and adjust her Lantus dose as needed.   Admission Diagnosis  Cerebrovascular accident (CVA), unspecified mechanism ([place]) [I63.9]   Discharge Diagnosis  Cerebrovascular accident (CVA), unspecified mechanism ([place]) [I63.9]    Principal Problem:   Right thalamic infarction ([place]) Active Problems:   Type 2 diabetes mellitus ([place])   Hyperlipidemia   Leucocytosis      Past Medical History:  Diagnosis Date   Diabetes mellitus without complication ([place])    Hyperlipidemia     Past Surgical History:  Procedure Laterality Date   [place]       History of present illness and  Hospital Course:     Kindly see H&P for history of present illness and admission details, please review complete Labs, Consult reports and Test reports for all details in brief  HPI  from the history and physical done on the day of admission 12/19/2018 HPI: Haley Gomez is a 53 y.o. female with medical history significant of diabetes, hyperlipidemia presenting with left-sided numbness that started a day ago.  Numbness is localized to the upper lip left hand and forearm.  Also on the outside of the left foot.  It initially was about 30 minutes then went to hours.  It went away but came back today and has stayed.  Patient tried several things at home.  She thought it was going away but does not go away so she came to the ER.  She had similar episode last year when she was diagnosed with vertigo.  She had CT or MRI at that time that were negative.  Patient  treated with meclizine and got better.  She denied any focal weakness.  Denied any dizziness this time around.  No nausea vomiting or diarrhea.  Patient had MRI done that showed small right thalamic infarct and so she is being admitted for work-up..  ED Course: Vitals showed blood pressure 155/87 with pulse of 102.  White count is 12.4 otherwise chemistry and CBC appear to be normal.  Blood glucose is 300.  MRI of the brain showed right thalamic infarct.  EKG has not been done.   Hospital Course   Ms.Haley Gomez a 53 y.o.femalewith history of diabetes mellituspresenting with L face and arm numbness.,  Her work-up significant for acute CVA   Acute CVA -Evidence of right thalamic infarction, this is most likely in the setting of poorly controlled diabetes and hyperlipidemia, CTA head and neck was obtained, tends of stable narrowing of right P2 PCA, LDL is elevated at 199, her A1c is elevated at 12.5, neurology input greatly appreciated, recommendation is for dual antiplatelet therapy,  aspirin 81 mg oral daily, Plavix 75 mg daily after following load, to continue for 3 weeks, then aspirin alone -she was seen by PT/OT.  No recommendation for outpatient therapy -The echo with preserved EF and no embolic source  Hyperlipidemia -DL is 199, goal is less than 70, will increase Lipitor from 20 to 80 mg  Diabetes mellitus type 2, poorly controlled -A1c is 12.5, poorly controlled, it was resistant to start insulin initially, but after discussion she is agreeable, discharged on Lantus 10 units subcu daily, to resume her metformin after 72 hours given she received IV contrast, continue with glipizide .    Discharge Condition: Stable   Follow UP  Follow-up Information    Pleas Koch, NP Follow up on 03/27/2019.   Specialty:  Internal Medicine Why:  Your appointment time is 10:20 am. Please arrive early and bring your current medications, insurance card.  Contact  information: [place] [address] [place] Follow up on 12/24/2018.   Why:  Appointment is at 9:30. They will contact you tomorrow to see if appointment will be at the clinic or telephonic. Have your current meds, insurance info and picture ID. Contact information: [place] [phone] [facility]. Schedule an appointment as soon as possible for a visit in 4 week(s).   Specialty:  Neurology Contact information: [address] [place] [phone]            Discharge Instructions  and  Discharge Medications    Discharge Instructions    Ambulatory referral to Neurology   Complete by:  As directed    Follow up with stroke clinic NP (Jessica Vanschaick or Cecille Rubin, if both not available, consider Zachery Dauer, or [place]) at [facility] in about 4 weeks. Thanks.   Ambulatory referral to Nutrition and Diabetic Education   Complete by:  As directed    Hgb A1c 12.5%. Interested in outpatient diet education.   Discharge instructions   Complete by:  As directed    Follow with Primary MD Pleas Koch, NP in 7 days   Get CBC, CMP,  checked  by Primary MD next visit.    Activity: As tolerated with Full fall precautions use walker/cane & assistance as needed   Disposition Home    Diet: Heart Healthy , carb modified , with feeding assistance and aspiration precautions.  For Heart failure patients - Check your Weight same time everyday, if you gain over 2 pounds, or you develop in leg swelling, experience more shortness of breath or chest pain, call your Primary MD immediately. Follow Cardiac Low Salt Diet and 1.5 lit/day fluid restriction.   On your next visit with your primary care physician please Get Medicines reviewed and adjusted.   Please request your Prim.MD to go over all Hospital  Tests and Procedure/Radiological results at the follow up, please get all Hospital records sent to your Prim MD by signing hospital release before you go home.   If you experience worsening of your admission symptoms, develop shortness of breath, life threatening emergency, suicidal or homicidal thoughts you must seek medical attention immediately by calling 911 or calling your MD immediately  if symptoms less severe.  You Must read complete instructions/literature along with all the possible adverse reactions/side effects for all the Medicines you take and that have  been prescribed to you. Take any new Medicines after you have completely understood and accpet all the possible adverse reactions/side effects.   Do not drive, operating heavy machinery, perform activities at heights, swimming or participation in water activities or provide baby sitting services if your were admitted for syncope or siezures until you have seen by Primary MD or a Neurologist and advised to do so again.  Do not drive when taking Pain medications.    Do not take more than prescribed Pain, Sleep and Anxiety Medications  Special Instructions: If you have smoked or chewed Tobacco  in the last 2 yrs please stop smoking, stop any regular Alcohol  and or any Recreational drug use.  Wear Seat belts while driving.   Please note  You were cared for by a hospitalist during your hospital stay. If you have any questions about your discharge medications or the care you received while you were in the hospital after you are discharged, you can call the unit and asked to speak with the hospitalist on call if the hospitalist that took care of you is not available. Once you are discharged, your primary care physician will handle any further medical issues. Please note that NO REFILLS for any discharge medications will be authorized once you are discharged, as it is imperative that you return to your primary care physician (or establish  a relationship with a primary care physician if you do not have one) for your aftercare needs so that they can reassess your need for medications and monitor your lab values.   Discharge instructions   Complete by:  As directed    Follow with Primary MD Pleas Koch, NP in 7 days   Get CBC, CMP,  checked  by Primary MD next visit.    Activity: As tolerated with Full fall precautions use walker/cane & assistance as needed   Disposition Home    Diet: Heart Healthy , carb modified , with feeding assistance and aspiration precautions.  For Heart failure patients - Check your Weight same time everyday, if you gain over 2 pounds, or you develop in leg swelling, experience more shortness of breath or chest pain, call your Primary MD immediately. Follow Cardiac Low Salt Diet and 1.5 lit/day fluid restriction.   On your next visit with your primary care physician please Get Medicines reviewed and adjusted.   Please request your Prim.MD to go over all Hospital Tests and Procedure/Radiological results at the follow up, please get all Hospital records sent to your Prim MD by signing hospital release before you go home.   If you experience worsening of your admission symptoms, develop shortness of breath, life threatening emergency, suicidal or homicidal thoughts you must seek medical attention immediately by calling 911 or calling your MD immediately  if symptoms less severe.  You Must read complete instructions/literature along with all the possible adverse reactions/side effects for all the Medicines you take and that have been prescribed to you. Take any new Medicines after you have completely understood and accpet all the possible adverse reactions/side effects.   Do not drive, operating heavy machinery, perform activities at heights, swimming or participation in water activities or provide baby sitting services if your were admitted for syncope or siezures until you have seen by Primary  MD or a Neurologist and advised to do so again.  Do not drive when taking Pain medications.    Do not take more than prescribed Pain, Sleep and Anxiety Medications  Special Instructions: If  you have smoked or chewed Tobacco  in the last 2 yrs please stop smoking, stop any regular Alcohol  and or any Recreational drug use.  Wear Seat belts while driving.   Please note  You were cared for by a hospitalist during your hospital stay. If you have any questions about your discharge medications or the care you received while you were in the hospital after you are discharged, you can call the unit and asked to speak with the hospitalist on call if the hospitalist that took care of you is not available. Once you are discharged, your primary care physician will handle any further medical issues. Please note that NO REFILLS for any discharge medications will be authorized once you are discharged, as it is imperative that you return to your primary care physician (or establish a relationship with a primary care physician if you do not have one) for your aftercare needs so that they can reassess your need for medications and monitor your lab values.   Increase activity slowly   Complete by:  As directed    Increase activity slowly   Complete by:  As directed      Allergies as of 12/21/2018      Reactions   Bactrim [sulfamethoxazole-trimethoprim] Nausea Only      Medication List    STOP taking these medications   amphetamine-dextroamphetamine 10 MG tablet Commonly known as:  ADDERALL     TAKE these medications   aspirin 81 MG EC tablet Take 1 tablet (81 mg total) by mouth daily. Start taking on:  December 22, 2018   atorvastatin 80 MG tablet Commonly known as:  LIPITOR Take 1 tablet (80 mg total) by mouth daily at 6 PM. What changed:    medication strength  how much to take  when to take this   blood glucose meter kit and supplies Kit Dispense based on patient and insurance  preference. Use up to four times daily as directed. (FOR ICD-9 250.00, 250.01).   clopidogrel 75 MG tablet Commonly known as:  PLAVIX Take 1 tablet (75 mg total) by mouth daily. Take for 21 days then stop   diphenhydrAMINE 25 MG tablet Commonly known as:  BENADRYL Take 25 mg by mouth every 6 (six) hours as needed for sleep.   glipiZIDE 10 MG 24 hr tablet Commonly known as:  GLUCOTROL XL TAKE 1 TABLET BY MOUTH EVERY DAY WITH BREAKFAST   NEED OFFICE VISIT FOR FURTHER REFILLS   Insulin Glargine 100 UNIT/ML Solostar Pen Commonly known as:  LANTUS Inject 10 Units into the skin daily.   Insulin Pen Needle 32G X 4 MM Misc Use with insulin pen   metFORMIN 1000 MG tablet Commonly known as:  GLUCOPHAGE Take 1 tablet (1,000 mg total) by mouth 2 (two) times daily. Resume 12/24/2018 Start taking on:  December 24, 2018 What changed:    additional instructions  These instructions start on December 24, 2018. If you are unsure what to do until then, ask your doctor or other care provider.   pantoprazole 40 MG tablet Commonly known as:  Protonix Take 1 tablet (40 mg total) by mouth daily.         Diet and Activity recommendation: See Discharge Instructions above   Consults obtained - Neurology   Major procedures and Radiology Reports - PLEASE review detailed and final reports for all details, in brief -      Ct Angio Head W Or Wo Contrast  Result Date: 12/20/2018 CLINICAL DATA:  Stroke follow-up. LEFT-sided tingling and numbness. Acute RIGHT thalamic infarct. Stroke risk factors include diabetes mellitus and hyperlipidemia. EXAM: CT ANGIOGRAPHY HEAD AND NECK TECHNIQUE: Multidetector CT imaging of the head and neck was performed using the standard protocol during bolus administration of intravenous contrast. Multiplanar CT image reconstructions and MIPs were obtained to evaluate the vascular anatomy. Carotid stenosis measurements (when applicable) are obtained utilizing NASCET criteria,  using the distal internal carotid diameter as the denominator. CONTRAST:  48m OMNIPAQUE IOHEXOL 350 MG/ML SOLN COMPARISON:  MR brain 12/19/2018. Intracranial and extracranial MRA 02/10/2018. FINDINGS: CTA NECK FINDINGS Aortic arch: Standard branching. Imaged portion shows no evidence of aneurysm or dissection. No significant stenosis of the major arch vessel origins. Right carotid system: No evidence of dissection, stenosis (50% or greater) or occlusion. Left carotid system: No evidence of dissection, stenosis (50% or greater) or occlusion. Vertebral arteries: BILATERAL patent. RIGHT slightly larger. No evidence of dissection, stenosis (50% or greater) or occlusion. Skeleton: Spondylosis. Other neck: No masses. Upper chest: Mild vascular congestion. No pneumothorax or nodule. Review of the MIP images confirms the above findings CTA HEAD FINDINGS Anterior circulation: Calcification of the cavernous internal carotid arteries consistent with cerebrovascular atherosclerotic disease. No significant stenosis, proximal occlusion, aneurysm, or vascular malformation. Posterior circulation: Vertebral arteries are patent. Basilar artery is patent. There is slight narrowing of the proximal P2 segment RIGHT posterior cerebral artery, estimated 50%, not clearly flow reducing. No cerebellar branch occlusion. Venous sinuses: As permitted by contrast timing, patent. Anatomic variants: None of significance. Delayed phase: No abnormal intracranial enhancement. Review of the MIP images confirms the above findings IMPRESSION: No extracranial or intracranial stenosis of significance. Slight narrowing of proximal P2 segment RIGHT PCA, appears similar to priors. Electronically Signed   By: JStaci RighterM.D.   On: 12/20/2018 09:31   Ct Angio Neck W Or Wo Contrast  Result Date: 12/20/2018 CLINICAL DATA:  Stroke follow-up. LEFT-sided tingling and numbness. Acute RIGHT thalamic infarct. Stroke risk factors include diabetes mellitus and  hyperlipidemia. EXAM: CT ANGIOGRAPHY HEAD AND NECK TECHNIQUE: Multidetector CT imaging of the head and neck was performed using the standard protocol during bolus administration of intravenous contrast. Multiplanar CT image reconstructions and MIPs were obtained to evaluate the vascular anatomy. Carotid stenosis measurements (when applicable) are obtained utilizing NASCET criteria, using the distal internal carotid diameter as the denominator. CONTRAST:  521mOMNIPAQUE IOHEXOL 350 MG/ML SOLN COMPARISON:  MR brain 12/19/2018. Intracranial and extracranial MRA 02/10/2018. FINDINGS: CTA NECK FINDINGS Aortic arch: Standard branching. Imaged portion shows no evidence of aneurysm or dissection. No significant stenosis of the major arch vessel origins. Right carotid system: No evidence of dissection, stenosis (50% or greater) or occlusion. Left carotid system: No evidence of dissection, stenosis (50% or greater) or occlusion. Vertebral arteries: BILATERAL patent. RIGHT slightly larger. No evidence of dissection, stenosis (50% or greater) or occlusion. Skeleton: Spondylosis. Other neck: No masses. Upper chest: Mild vascular congestion. No pneumothorax or nodule. Review of the MIP images confirms the above findings CTA HEAD FINDINGS Anterior circulation: Calcification of the cavernous internal carotid arteries consistent with cerebrovascular atherosclerotic disease. No significant stenosis, proximal occlusion, aneurysm, or vascular malformation. Posterior circulation: Vertebral arteries are patent. Basilar artery is patent. There is slight narrowing of the proximal P2 segment RIGHT posterior cerebral artery, estimated 50%, not clearly flow reducing. No cerebellar branch occlusion. Venous sinuses: As permitted by contrast timing, patent. Anatomic variants: None of significance. Delayed phase: No abnormal intracranial enhancement. Review of the MIP images confirms the  above findings IMPRESSION: No extracranial or intracranial  stenosis of significance. Slight narrowing of proximal P2 segment RIGHT PCA, appears similar to priors. Electronically Signed   By: Staci Righter M.D.   On: 12/20/2018 09:31   Mr Brain Wo Contrast  Result Date: 12/19/2018 CLINICAL DATA:  Left-sided tingling and numbness EXAM: MRI HEAD WITHOUT CONTRAST TECHNIQUE: Multiplanar, multiecho pulse sequences of the brain and surrounding structures were obtained without intravenous contrast. COMPARISON:  Brain MRI 02/10/2018 FINDINGS: BRAIN: Small acute infarct of the dorsolateral right thalamus. The midline structures are normal. No midline shift or other mass effect. The white matter signal is normal for the patient's age. The cerebral and cerebellar volume are age-appropriate. No hydrocephalus. Susceptibility-sensitive sequences show no chronic microhemorrhage or superficial siderosis. No mass lesion. VASCULAR: The major intracranial arterial and venous sinus flow voids are normal. SKULL AND UPPER CERVICAL SPINE: Calvarial bone marrow signal is normal. There is no skull base mass. Visualized upper cervical spine and soft tissues are normal. SINUSES/ORBITS: No fluid levels or advanced mucosal thickening. No mastoid or middle ear effusion. The orbits are normal. IMPRESSION: 1. Small acute infarct of the dorsolateral right thalamus, in close proximity to the posterior limb of the internal capsule, in keeping with the reported left-sided symptoms. 2. Otherwise normal MRI of the brain. Electronically Signed   By: Ulyses Jarred M.D.   On: 12/19/2018 22:18    Micro Results   No results found for this or any previous visit (from the past 240 hour(s)).     Today   Subjective:   Danyal Adorno today has no headache,no chest or abdominal pain, still reports left-sided tingling and numbness  Objective:   Blood pressure 108/73, pulse 90, temperature 97.6 F (36.4 C), temperature source Oral, resp. rate 18, height '5\' 2"'  (1.575 m), weight 69.4 kg, SpO2 100  %.  No intake or output data in the 24 hours ending 12/21/18 0940  Exam Awake Alert, Oriented x 3, No new F.N deficits, Normal affect Symmetrical Chest wall movement, Good air movement bilaterally, CTAB RRR,No Gallops,Rubs or new Murmurs, No Parasternal Heave +ve B.Sounds, Abd Soft, Non tender, No organomegaly appriciated No Cyanosis, Clubbing or edema, No new Rash or bruise  Data Review   CBC w Diff:  Lab Results  Component Value Date   WBC 11.0 (H) 12/20/2018   HGB 12.7 12/20/2018   HCT 39.7 12/20/2018   PLT 377 12/20/2018   LYMPHOPCT 10 12/19/2018   MONOPCT 2 12/19/2018   EOSPCT 0 12/19/2018   BASOPCT 0 12/19/2018    CMP:  Lab Results  Component Value Date   NA 135 12/19/2018   K 4.1 12/19/2018   CL 100 12/19/2018   CO2 22 12/19/2018   BUN 12 12/19/2018   CREATININE 0.55 12/20/2018   PROT 7.4 06/04/2015   ALBUMIN 4.1 06/04/2015   BILITOT 0.2 06/04/2015   ALKPHOS 121 (H) 06/04/2015   AST 15 06/04/2015   ALT 14 06/04/2015  .   Total Time in preparing paper work, data evaluation and todays exam - 60 minutes  Phillips Climes M.D on 12/21/2018 at 9:40 AM  [facility]   Office  [phone]

## 2018-12-21 NOTE — TOC Transition Note (Signed)
Transition of Care [facility]) - CM/SW Discharge Note   Patient Details  Name: Haley Gomez MRN: [id number] Date of Birth: July 04, 1966  [facility] CM/SW Contact:  Kermit Balo, RN Phone Number: 12/21/2018, 10:22 AM   Clinical Narrative:    Pt discharging home. No f/u per PT/OT. Pt has transport home. CM provided coupon for Lantus to assist with cost.   Final next level of care: Home/Self Care Barriers to Discharge: No Barriers Identified   Patient Goals and CMS Choice Patient states their goals for this hospitalization and ongoing recovery are:: to have decreased stress and be more compliant with her own healthcare      Discharge Placement                       Discharge Plan and Services   Discharge Planning Services: CM Consult                      Social Determinants of Health (SDOH) Interventions     Readmission Risk Interventions No flowsheet data found.

## 2018-12-21 NOTE — Plan of Care (Signed)
Adequate for discharge.

## 2018-12-24 ENCOUNTER — Other Ambulatory Visit: Payer: Self-pay

## 2018-12-24 ENCOUNTER — Ambulatory Visit: Payer: Managed Care, Other (non HMO) | Admitting: Nurse Practitioner

## 2018-12-24 ENCOUNTER — Telehealth: Payer: Self-pay

## 2018-12-24 NOTE — Telephone Encounter (Signed)
Noted  

## 2018-12-24 NOTE — Telephone Encounter (Signed)
Transitional Care Management Follow-up Telephone Call    Date discharged? 12/21/18  How have you been since you were released from the hospital? Recovering.   Any patient concerns? None verbalized.    Items Reviewed:  Medications reviewed: Yes  Allergies reviewed: Yes  Dietary changes reviewed: Yes  Referrals reviewed: Yes, neurology and nutrition   Functional Questionnaire:  Independent - I Dependent - D    Activities of Daily Living (ADLs):    Personal hygiene - I Dressing - I Eating - I Maintaining continence - I Transferring - I   Independent Activities of Daily Living (iADLs): Basic communication skills - I Transportation - I Meal preparation  - I Shopping - I Housework - I Managing medications - I  Managing personal finances - I   Confirmed importance and date/time of follow-up visits scheduled YES  Provider Appointment booked with PCP 12/31/18 @ 0900  Confirmed with patient if condition begins to worsen call PCP or go to the ER.  Patient was given the office number and encouraged to call back with question or concerns: YES

## 2018-12-25 ENCOUNTER — Inpatient Hospital Stay: Payer: Managed Care, Other (non HMO) | Admitting: Nurse Practitioner

## 2018-12-31 ENCOUNTER — Encounter: Payer: Self-pay | Admitting: Primary Care

## 2018-12-31 ENCOUNTER — Other Ambulatory Visit: Payer: Self-pay

## 2018-12-31 ENCOUNTER — Ambulatory Visit: Payer: Managed Care, Other (non HMO) | Admitting: Primary Care

## 2018-12-31 VITALS — BP 122/76 | HR 91 | Temp 98.1°F | Ht 62.0 in | Wt 158.5 lb

## 2018-12-31 DIAGNOSIS — I6381 Other cerebral infarction due to occlusion or stenosis of small artery: Secondary | ICD-10-CM

## 2018-12-31 DIAGNOSIS — E785 Hyperlipidemia, unspecified: Secondary | ICD-10-CM

## 2018-12-31 DIAGNOSIS — I69352 Hemiplegia and hemiparesis following cerebral infarction affecting left dominant side: Secondary | ICD-10-CM

## 2018-12-31 DIAGNOSIS — I69398 Other sequelae of cerebral infarction: Secondary | ICD-10-CM | POA: Diagnosis not present

## 2018-12-31 DIAGNOSIS — R208 Other disturbances of skin sensation: Secondary | ICD-10-CM | POA: Diagnosis not present

## 2018-12-31 DIAGNOSIS — E1159 Type 2 diabetes mellitus with other circulatory complications: Secondary | ICD-10-CM

## 2018-12-31 DIAGNOSIS — I639 Cerebral infarction, unspecified: Secondary | ICD-10-CM

## 2018-12-31 DIAGNOSIS — D72829 Elevated white blood cell count, unspecified: Secondary | ICD-10-CM | POA: Diagnosis not present

## 2018-12-31 LAB — BASIC METABOLIC PANEL
BUN: 12 mg/dL (ref 6–23)
CO2: 24 mEq/L (ref 19–32)
Calcium: 9.6 mg/dL (ref 8.4–10.5)
Chloride: 104 mEq/L (ref 96–112)
Creatinine, Ser: 0.47 mg/dL (ref 0.40–1.20)
GFR: 168.61 mL/min (ref >60.00)
Glucose, Bld: 111 mg/dL — ABNORMAL HIGH (ref 70–99)
Potassium: 3.5 mEq/L (ref 3.5–5.1)
Sodium: 140 mEq/L (ref 135–145)

## 2018-12-31 LAB — CBC
HCT: 39.5 % (ref 36.0–46.0)
Hemoglobin: 12.7 g/dL (ref 12.0–15.0)
MCHC: 32.1 g/dL (ref 30.0–36.0)
MCV: 79.4 fl (ref 78.0–100.0)
Platelets: 329 10*3/uL (ref 150.0–400.0)
RBC: 4.98 Mil/uL (ref 3.87–5.11)
RDW: 15.5 % (ref 11.5–15.5)
WBC: 11.2 10*3/uL — ABNORMAL HIGH (ref 4.0–10.5)

## 2018-12-31 MED ORDER — GABAPENTIN 100 MG PO CAPS: 100.0000 mg | ORAL_CAPSULE | Freq: Three times a day (TID) | ORAL | 0 refills | Status: DC

## 2018-12-31 NOTE — Assessment & Plan Note (Addendum)
Diagnosed recently during hospital stay. Compliant to her prescribed regimen, BP is under good control. Exam today stable.  Referral placed for outpatient PT/OT for residual left sided symptoms.  Will work on gaining good control of diabetes. Rx for Gabapentin 100 mg TID provided for nerve discomfort, discussed to take HS first given potential drowsiness side effects.  Continue clopidogrel for the next several weeks then stop. Continue aspirin.  We will see her back in July 2020. Will sign FMLA paperwork.

## 2018-12-31 NOTE — Patient Instructions (Signed)
Stop by the lab prior to leaving today. I will notify you of your results once received.   You will be contacted regarding your referral to physical therapy.  Please let us know if you have not been contacted within one week.   You may take the gabapentin 100 mg every 8 hours for nerve pain. Start with bedtime dosing as discussed.  Start exercising. You should be getting 150 minutes of moderate intensity exercise weekly.  It is important that you improve your diet. Please limit carbohydrates in the form of white bread, rice, pasta, sweets, fast food, fried food, sugary drinks, etc. Increase your consumption of fresh fruits and vegetables, whole grains, lean protein.  Ensure you are consuming 64 ounces of water daily.  We will plan to see you back in July 2020 as scheduled.  It was a pleasure to see you today!

## 2018-12-31 NOTE — Assessment & Plan Note (Signed)
Compliant to atorvastatin 80 mg daily. Repeat lipids at appointment in July 2020.

## 2018-12-31 NOTE — Assessment & Plan Note (Addendum)
Uncontrolled, had not been in for follow up since 2016.  Recent A1C of 12.5, goal is <7. Continue Lantus 10 units HS. Discussed to continue to monitor glucose and to increase Lantus to 12 units if readings are continuously at or above 130. Continue Glipizide and Metformin.   Discussed to work on diet and exercise.  Repeat labs in July as scheduled.

## 2018-12-31 NOTE — Assessment & Plan Note (Signed)
Repeat CBC pending today. She is asymptomatic for URI/systemic symptoms.

## 2018-12-31 NOTE — Progress Notes (Signed)
Subjective:    Patient ID: Haley Gomez, female    DOB: [birth year], 53 y.o.   MRN: [id number]  HPI  Haley Gomez is a 53 year old female who presents today to re-establish care and for hospital follow up. She has not been seen in our clinic 05/2015.  She presented to [facility] on 12/19/18 for an 8+ hour history of left side and facial numbness. Her exam was concerning for CVA so stroke evaluation was initiated. MRI did confirm right thalamic CVA so she was admitted for further treatment. She did not receive TPA as she was outside of the window.  During her hospital stay she was initiated on aspirin and clopidogrel. Clopidogrel was recommended daily for three weeks then to stop and maintain aspirin alone. She underwent carotid dopplers and echocardiogram. Lipitor was increased from 20 mg to 80 mg. She was noted to be hypertensive but this was suspected to normalize within a few days. A1C was 12.7, goal is <7, so she was initiated on Lantus 10 units HS and Glipizide Er 10 mg daily along with her Metformin. She underwent treatment with PT/OT. She was discharged home on 12/21/18 with recommendations for repeat CBC, BMP, and close diabetes follow up.  Since her hospital stay she's doing well except for residual left sided symptoms including burning. She was not referred to outpatient physical therapy but is struggling with some movement as she is left side dominant. She continues to experience weakness/numbness to left upper and lower extremities. She cannot write much due to intermittent burning sensation to her hands and forearm.   She endorses that she wasn't taking Metformin and Glipizide consistently, missing doses sometimes, prior to her stroke. She is compliant to 10 units of Lantus. She is checking her glucose every morning fasting and is getting readings of: 155, 176, 146, 153. She is remembering to take her Glipizide and Metformin consistently. She did increase her Lantus to 12 units one evening  but felt "bad" with this. She has not started exercising.   She is compliant to clopidogrel and aspirin. She understands to stop clopidogrel after three weeks of taking. She denies new numbness/tingling, changes in speech, dizziness. She does feel "foggy" somewhat. She is also needing FMLA forms completed as she's struggling to work from home due to residual symptoms. She tried working on March 30th and 31st but struggled.   BP Readings from Last 3 Encounters:  12/31/18 122/76  12/21/18 108/73  12/19/18 121/83     Review of Systems  Eyes: Negative for visual disturbance.  Respiratory: Negative for shortness of breath.   Cardiovascular: Negative for chest pain.  Neurological: Positive for weakness and numbness.       Burning sensation to left hand/arm       Past Medical History:  Diagnosis Date  . Diabetes mellitus without complication ([place])   . Hyperlipidemia      Social History   Socioeconomic History  . Marital status: Divorced    Spouse name: Not on file  . Number of children: Not on file  . Years of education: Not on file  . Highest education level: Not on file  Occupational History  . Not on file  Social Needs  . Financial resource strain: Not on file  . Food insecurity:    Worry: Not on file    Inability: Not on file  . Transportation needs:    Medical: Not on file    Non-medical: Not on file  Tobacco Use  .  Smoking status: Never Smoker  . Smokeless tobacco: Never Used  Substance and Sexual Activity  . Alcohol use: Yes    Alcohol/week: 0.0 standard drinks    Comment: social  . Drug use: Never  . Sexual activity: Not on file  Lifestyle  . Physical activity:    Days per week: Not on file    Minutes per session: Not on file  . Stress: Not on file  Relationships  . Social connections:    Talks on phone: Not on file    Gets together: Not on file    Attends religious service: Not on file    Active member of club or organization: Not on file    Attends  meetings of clubs or organizations: Not on file    Relationship status: Not on file  . Intimate partner violence:    Fear of current or ex partner: Not on file    Emotionally abused: Not on file    Physically abused: Not on file    Forced sexual activity: Not on file  Other Topics Concern  . Not on file  Social History Narrative   Single.   3 children.   Works as a Marine scientist at [employer]   Enjoys traveling, spending time with friends, exercising, reading.    Past Surgical History:  Procedure Laterality Date  . CESAREAN SECTION    . GALLBLADDER SURGERY  1997    Family History  Problem Relation Age of Onset  . Diabetes Mother   . Alcohol abuse Father   . Hypertension Father   . Kidney disease Father   . Stroke Father     Allergies  Allergen Reactions  . Bactrim [Sulfamethoxazole-Trimethoprim] Nausea Only    Current Outpatient Medications on File Prior to Visit  Medication Sig Dispense Refill  . aspirin EC 81 MG EC tablet Take 1 tablet (81 mg total) by mouth daily. 30 tablet 3  . atorvastatin (LIPITOR) 80 MG tablet Take 1 tablet (80 mg total) by mouth daily at 6 PM. 30 tablet 3  . blood glucose meter kit and supplies KIT Dispense based on patient and insurance preference. Use up to four times daily as directed. (FOR ICD-9 250.00, 250.01). 1 each 0  . clopidogrel (PLAVIX) 75 MG tablet Take 1 tablet (75 mg total) by mouth daily. Take for 21 days then stop 21 tablet 0  . diphenhydrAMINE (BENADRYL) 25 MG tablet Take 25 mg by mouth every 6 (six) hours as needed for sleep.    Marland Kitchen glipiZIDE (GLUCOTROL XL) 10 MG 24 hr tablet TAKE 1 TABLET BY MOUTH EVERY DAY WITH BREAKFAST   NEED OFFICE VISIT FOR FURTHER REFILLS 30 tablet 3  . Insulin Glargine (LANTUS) 100 UNIT/ML Solostar Pen Inject 10 Units into the skin daily. 15 mL 3  . Insulin Pen Needle 32G X 4 MM MISC Use with insulin pen 100 each 1  . metFORMIN (GLUCOPHAGE) 1000 MG tablet Take 1 tablet (1,000 mg total) by mouth 2 (two) times daily.  Resume 12/24/2018 60 tablet 3   No current facility-administered medications on file prior to visit.     BP 122/76   Pulse 91   Temp 98.1 F (36.7 C) (Oral)   Ht _0  (1.575 m)   Wt 158 lb 8 oz (71.9 kg)   SpO2 99%   BMI 28.99 kg/m    Objective:   Physical Exam  Constitutional: She is oriented to person, place, and time. She appears well-nourished.  Eyes: Pupils are equal, round,  and reactive to light. EOM are normal.  Neck: Neck supple.  Cardiovascular: Normal rate and regular rhythm.  Respiratory: Effort normal and breath sounds normal.  Neurological: She is alert and oriented to person, place, and time. No cranial nerve deficit.  Speech clear. Mild weakness to left upper extremity when compared to right.  Skin: Skin is warm and dry.  Psychiatric: She has a normal mood and affect.           Assessment & Plan:

## 2019-01-01 ENCOUNTER — Encounter: Payer: Self-pay | Admitting: Primary Care

## 2019-01-01 ENCOUNTER — Telehealth: Payer: Self-pay | Admitting: Primary Care

## 2019-01-01 ENCOUNTER — Other Ambulatory Visit: Payer: Self-pay | Admitting: Primary Care

## 2019-01-01 DIAGNOSIS — I639 Cerebral infarction, unspecified: Secondary | ICD-10-CM

## 2019-01-01 DIAGNOSIS — I6381 Other cerebral infarction due to occlusion or stenosis of small artery: Secondary | ICD-10-CM

## 2019-01-01 NOTE — Telephone Encounter (Signed)
Short term disability claim form In kate[employer] in box

## 2019-01-02 NOTE — Telephone Encounter (Signed)
Completed and handed to Robin. °

## 2019-01-02 NOTE — Telephone Encounter (Signed)
Paperwork faxed °

## 2019-01-03 ENCOUNTER — Encounter: Payer: Self-pay | Admitting: *Deleted

## 2019-01-03 NOTE — Telephone Encounter (Signed)
Copy for pt °Copy for scan °

## 2019-01-03 NOTE — Telephone Encounter (Signed)
Pt aware.

## 2019-01-17 ENCOUNTER — Other Ambulatory Visit: Payer: Self-pay

## 2019-01-17 ENCOUNTER — Ambulatory Visit
Payer: Managed Care, Other (non HMO) | Attending: Primary Care | Admitting: Rehabilitative and Restorative Service Providers"

## 2019-01-17 ENCOUNTER — Ambulatory Visit: Payer: Managed Care, Other (non HMO) | Admitting: Occupational Therapy

## 2019-01-17 DIAGNOSIS — I639 Cerebral infarction, unspecified: Secondary | ICD-10-CM

## 2019-01-17 DIAGNOSIS — R209 Unspecified disturbances of skin sensation: Secondary | ICD-10-CM | POA: Insufficient documentation

## 2019-01-17 DIAGNOSIS — R2689 Other abnormalities of gait and mobility: Secondary | ICD-10-CM | POA: Diagnosis present

## 2019-01-17 DIAGNOSIS — R2681 Unsteadiness on feet: Secondary | ICD-10-CM

## 2019-01-17 DIAGNOSIS — M6281 Muscle weakness (generalized): Secondary | ICD-10-CM | POA: Diagnosis present

## 2019-01-17 DIAGNOSIS — R278 Other lack of coordination: Secondary | ICD-10-CM | POA: Insufficient documentation

## 2019-01-17 DIAGNOSIS — R29818 Other symptoms and signs involving the nervous system: Secondary | ICD-10-CM | POA: Insufficient documentation

## 2019-01-17 DIAGNOSIS — R448 Other symptoms and signs involving general sensations and perceptions: Secondary | ICD-10-CM

## 2019-01-17 NOTE — Therapy (Signed)
[facility] [address] [place], [place], [address] Phone: [phone]   Fax:  [phone]  Occupational Therapy Evaluation  Patient Details  Name: Haley CushingMichelle G Gomez MRN: [id number] Date of Birth: [birth year] Referring Provider (OT): Vernona RiegerKatherine Clark, NP   Encounter Date: 01/17/2019  OT End of Session - 01/17/19 1144    Visit Number  1    Number of Visits  16    Date for OT Re-Evaluation  03/19/19    Authorization Type  [employer] - OT/PT 30 VISIT limit - if seen on same day counts as 1 visit    OT Start Time  1100    OT Stop Time  1145    OT Time Calculation (min)  45 min    Activity Tolerance  Patient tolerated treatment well    Behavior During Therapy  [facility] for tasks assessed/performed       Past Medical History:  Diagnosis Date  . Diabetes mellitus without complication (HCC)   . Hyperlipidemia   . Right thalamic infarction [facility](HCC)     Past Surgical History:  Procedure Laterality Date  . CESAREAN SECTION    . GALLBLADDER SURGERY  1997    There were no vitals filed for this visit.  Subjective Assessment - 01/17/19 1100    Pertinent History  CVA 12/19/18. PMH: DM    Currently in Pain?  Yes    Pain Score  4    up to 10/10 at times   Pain Location  Hand    Pain Orientation  Left    Pain Descriptors / Indicators  Burning    Pain Type  Neuropathic pain    Pain Onset  1 to 4 weeks ago    Pain Frequency  Constant    Aggravating Factors   increased activity, cold    Pain Relieving Factors  elevation, rest        [facility] - 01/17/19 0001      Assessment   Medical Diagnosis  Rt thalamic CVA   LT hemiparesis   Referring Provider (OT)  Vernona RiegerKatherine Clark, NP    Onset Date/Surgical Date  12/19/18    Hand Dominance  Left      Precautions   Precautions  Fall      Balance Screen   Has the patient fallen in the past 6 months  No      Home  Environment   Bathroom Shower/Tub  Tub/Shower unit;Curtain     Additional Comments  Pt lives in 2 story home (bedroom on 2nd floor), and 10 steps to enter     Lives With  Family      Prior Function   Level of Independence  Independent    Vocation  Full time employment    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    Leisure  reading      ADL   Eating/Feeding  Independent    Grooming  Modified independent    Upper Body Bathing  Independent    Lower Body Bathing  Independent    Upper Body Dressing  Increased time   difficulty w/ buttons, hooking bra, jewelry   Lower Body Dressing  Modified independent   slip on shoes   LexicographerToilet Transfer  Independent    Toileting -  Hygiene  Independent    Tub/Shower Transfer  Modified independent   slower and more cautious     IADL   Shopping  --   someone is  going for her currently - leg fatigues quicker   Light Housekeeping  Performs light daily tasks such as dishwashing, bed making;Does personal laundry completely   more rest breaks required   Meal Prep  --   Pt is assisting with this but altered sensation Lt dominant    Education officer, environmental own vehicle    Medication Management  Is responsible for taking medication in correct dosages at correct time      Mobility   Mobility Status  Independent      Written Expression   Dominant Hand  Left    Handwriting  Increased time;90% legible   fatigues quicker w/ decr legibility, decreased typing skills     Vision - History   Baseline Vision  Wears glasses only for reading      Vision Assessment   Comment  denies change      Cognition   Cognition Comments  appears WFL's      Sensation   Light Touch  Appears Intact    Additional Comments  Pt has burning sensation Lt hand. Pt also with increased sensitivity especially to cold      Coordination   9 Hole Peg Test  Right;Left    Right 9 Hole Peg Test  19.53 sec    Left 9 Hole Peg Test  27.38 sec      Edema   Edema  none      ROM / Strength   AROM / PROM / Strength   AROM;Strength      AROM   Overall AROM Comments  BUE AROM WNL's       Strength   Overall Strength Comments  BUE MMT grossly 5/5      Hand Function   Right Hand Grip (lbs)  45 lbs    Left Hand Grip (lbs)  20 lbs                      OT Education - 01/17/19 1132    Education Details  Putty HEP, coordination HEP    Person(s) Educated  Patient    Methods  Explanation;Demonstration;Handout    Comprehension  Verbalized understanding;Returned demonstration       OT Short Term Goals - 01/17/19 1249      OT SHORT TERM GOAL #1   Title  Independent with initial HEP - 02/16/19    Time  4    Period  Weeks    Status  New      OT SHORT TERM GOAL #2   Title  Pt to improve coordination as evidenced by performing 9 hole peg test in 23 sec or less Lt hand    Time  4    Period  Weeks    Status  New      OT SHORT TERM GOAL #3   Title  Pt to improve grip strength Lt hand to 28 lbs or greater to assist with opening jars/containers    Time  4    Period  Weeks    Status  New      OT SHORT TERM GOAL #4   Title  Pt to verbalize understanding with desensitization techniques     Time  4    Period  Weeks    Status  New      OT SHORT TERM GOAL #5   Title  Pt to demo writing full paragraph maintaining 90% or greater legibility in prep for work related tasks    Time  4    Period  Weeks    Status  New        OT Long Term Goals - 01/17/19 1255      OT LONG TERM GOAL #1   Title  Pt independent with updated HEP prn - 03/19/19    Time  8    Period  Weeks    Status  New      OT LONG TERM GOAL #2   Title  Grip strength Lt hand to be 40 lbs or greater to open jars/containers    Time  8    Period  Weeks    Status  New      OT LONG TERM GOAL #3   Title  Pt to return to using LUE as dominant UE 80% of the time    Time  8    Period  Weeks    Status  New      OT LONG TERM GOAL #4   Title  Pt to perform IADL tasks (cooking/cleaning) for 30 min or greater w/o rests or LOB  safely     Time  8    Period  Weeks    Status  New      OT LONG TERM GOAL #5   Title  Pt to type 25 wpm or greater with minimal errors in prep for work related tasks    Time  8    Period  Weeks    Status  New            Plan - 01/17/19 1244    Clinical Impression Statement  Pt is a 53 y.o. female who presents to outpatient rehab s/p Rt thalamic CVA on 12/19/18 affecting dominant Lt side. Pt presents today with decreased coordination, strength, and decreased sensation Lt dominant UE, as well as reports of decreased balance and dropping things from Lt hand. Pt also reports she is quicker to fatigue.     OT Occupational Profile and History  Detailed Assessment- Review of Records and additional review of physical, cognitive, psychosocial history related to current functional performance    Occupational performance deficits (Please refer to evaluation for details):  ADL's;IADL's;Work    Body Structure / Function / Physical Skills  ADL;UE functional use;FMC;Mobility;Sensation;Endurance;Pain;Strength;IADL;Coordination;Proprioception    Rehab Potential  Good    Clinical Decision Making  Several treatment options, min-mod task modification necessary    Comorbidities Affecting Occupational Performance:  None    Modification or Assistance to Complete Evaluation   Min-Moderate modification of tasks or assist with assess necessary to complete eval    OT Frequency  2x / week   however will most likely only do 1x/wk due to covid 19 (pt was offered telehealth services but currently ok with coming to clinic)   OT Duration  8 weeks    OT Treatment/Interventions  Self-care/ADL training;Therapeutic exercise;Coping strategies training;Moist Heat;Paraffin;Neuromuscular education;Patient/family education;Energy conservation;Fluidtherapy;Therapeutic activities;DME and/or AE instruction;Manual Therapy;Passive range of motion    Plan  review HEP's, issue desensitization techniques, practice typing, ?  fluidotherapy    Consulted and Agree with Plan of Care  Patient       Patient will benefit from skilled therapeutic intervention in order to improve the following deficits and impairments:  Body Structure / Function / Physical Skills  Visit Diagnosis: Other lack of coordination - Plan: Ot plan of care cert/re-cert  Muscle weakness (generalized) - Plan: Ot plan of care cert/re-cert  Altered sensation due to acute stroke [facility]) - Plan: Ot plan of  care cert/re-cert  Unsteadiness on feet - Plan: Ot plan of care cert/re-cert    Problem List Patient Active Problem List   Diagnosis Date Noted  . Leucocytosis 12/19/2018  . Right thalamic infarction (HCC) 12/19/2018  . Hot flashes 06/17/2015  . Preventative health care 06/17/2015  . Type 2 diabetes mellitus (HCC) 03/05/2015  . Hyperlipidemia 03/05/2015    Kelli Churn, OTR/L 01/17/2019, 1:00 PM  [facility] [address] [place], [place], [address] Phone: [phone]   Fax:  [phone]  Name: SHASHANA FULLINGTON MRN: [id number] Date of Birth: 11/20/1965

## 2019-01-17 NOTE — Patient Instructions (Signed)
  1. Grip Strengthening (Resistive Putty)   Squeeze putty using thumb and all fingers. Repeat _20___ times. Do __2-3__ sessions per day.   2. Roll putty into tube on table and pinch between each finger and thumb x 10 reps each. (Do ring and small finger together)    Coordination Activities  Perform the following activities for 10 minutes 2 times per day with left hand(s).   Rotate ball in fingertips (clockwise and counter-clockwise).  Flip cards 1 at a time as fast as you can.  Deal cards with your thumb (Hold deck in hand and push card off top with thumb).  Rotate one card in hand (clockwise and counter-clockwise).  Pick up coins one at a time until you get 5 in your hand, then move coins from palm to fingertips to stack one at a time. Do 3 stacks of 5  Practice writing and typing.

## 2019-01-18 NOTE — Therapy (Signed)
[facility] [address] [place], [place], [address] Phone: [phone]   Fax:  [phone]  Physical Therapy Evaluation  Patient Details  Name: Haley Gomez MRN: [id number] Date of Birth: July 14, 1966 No data recorded  Encounter Date: 01/17/2019  PT End of Session - 01/17/19 1216    Visit Number  1    Number of Visits  4    Date for PT Re-Evaluation  02/17/19    Authorization Type  [employer]    PT Start Time  1155    PT Stop Time  1240    PT Time Calculation (min)  45 min    Activity Tolerance  Patient tolerated treatment well    Behavior During Therapy  [facility] for tasks assessed/performed       Past Medical History:  Diagnosis Date  . Diabetes mellitus without complication (HCC)   . Hyperlipidemia   . Right thalamic infarction [facility])     Past Surgical History:  Procedure Laterality Date  . CESAREAN SECTION    . GALLBLADDER SURGERY  1997    There were no vitals filed for this visit.   CLINIC OPERATION CHANGES: [facility] is operating at a low capacity due to COVID-19.  The patient was brought into the clinic for evaluation and/or treatment following universal masking by staff, social distancing, and <10 people in the clinic.  The patient's COVID risk of complications score is 1.   Subjective Assessment - 01/17/19 1207    Subjective  The patient is s/p CVA (R thalamic/ L weakness) on 12/18/2018 d/c home on 12/20/18 from [facility].  She notes in the hospital she was doing well, but symptoms worsened slightly after being d/c home.  She feels good with mobility, but notes greater fatigue in the left leg.  She is having to move slower.  "My biggest challenge is my leg fatiguing."  Her daughter notes she limps when she gets tired.    Stumbling more than usual.     Pertinent History  diabetes, hyperlipidemia.    Patient Stated Goals  Improve endurance, reduce "stumbling" when fatigued.     Currently in Pain?  Yes    Pain Score  4     Pain Location  Hand    Pain Orientation  Left    Pain Descriptors / Indicators  Burning    Pain Onset  1 to 4 weeks ago    Pain Frequency  Constant    Aggravating Factors   increased activity, cold    Pain Relieving Factors  elevation, rest         [facility] PT Assessment - 01/17/19 1210      Assessment   Medical Diagnosis  R thalamic stroke    Onset Date/Surgical Date  12/18/18    Hand Dominance  Left      Precautions   Precautions  Fall      Restrictions   Weight Bearing Restrictions  No      Balance Screen   Has the patient fallen in the past 6 months  No    Has the patient had a decrease in activity level because of a fear of falling?   No    Is the patient reluctant to leave their home because of a fear of falling?   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      Coordination   Heel Shin Test  slowed on the left side to R shin ("now that's weak" per patient).      ROM / Strength   AROM / PROM / Strength  AROM;Strength      AROM   Overall AROM   Within functional limits for tasks performed      Strength   Overall Strength  Deficits    Strength Assessment Site  Hip;Knee;Ankle    Right/Left Hip  Right;Left    Right Hip Flexion  4/5    Right Hip ABduction  4/5    Left Hip Flexion  3+/5     Left Hip ABduction  3+/5    Right/Left Knee  Right;Left    Right Knee Flexion  5/5    Right Knee Extension  5/5    Left Knee Flexion  4/5    Left Knee Extension  4/5    Right/Left Ankle  Right;Left    Right Ankle Dorsiflexion  5/5    Left Ankle Dorsiflexion  4/5      Ambulation/Gait   Ambulation/Gait  Yes    Ambulation/Gait Assistance  7: Independent    Ambulation Distance (Feet)  1200 Feet    Assistive device  None    Gait Pattern  Decreased dorsiflexion - right;Decreased dorsiflexion - left    Ambulation Surface  Level    Gait velocity  2.64 ft/sec    Stairs  Yes    Stairs Assistance  6: Modified independent (Device/Increase time)    Stair Management Technique  Alternating pattern;Two rails    Number of Stairs  12    Gait Comments  heel walking:  challenging with difficulty lifting L ankle, toe walking is able to perform.      6 minute walk test results    Aerobic Endurance Distance Walked  1175    Endurance additional comments  Patient notes some fatigue with movement; notes increased numbness in the lateral aspect of the L foot.      Standardized Balance Assessment   Standardized Balance Assessment  Berg Balance Test      Berg Balance Test   Sit to Stand  Able to stand without using hands and stabilize independently    Standing Unsupported  Able to stand safely 2 minutes    Sitting with Back Unsupported but Feet Supported on Floor or Stool  Able to sit safely and securely 2 minutes    Stand to Sit  Sits safely with minimal use of hands    Transfers  Able to transfer safely, minor use of hands    Standing Unsupported with Eyes Closed  Able to stand 10 seconds safely    Standing Unsupported with Feet Together  Able to place feet together  independently and stand 1 minute safely    From Standing, Reach Forward with Outstretched Arm  Can reach forward >12 cm safely (5")    From Standing Position, Pick up Object from Floor  Able to pick up shoe safely and easily    From  Standing Position, Turn to Look Behind Over each Shoulder  Looks behind from both sides and weight shifts well    Turn 360 Degrees  Able to turn 360 degrees safely in 4 seconds or less    Standing Unsupported, Alternately Place Feet on Step/Stool  Able to stand independently and safely and complete 8 steps in 20 seconds    Standing Unsupported, One Foot in Front  Able to plae foot ahead of the other independently and hold 30 seconds    Standing on One Leg  Able to lift leg independently and hold 5-10 seconds    Total Score  53    Berg comment:  53/56                 Objective measurements completed on examination: See above findings.      [facility] Adult PT Treatment/Exercise - 01/17/19 1210      Neuro Re-ed    Neuro Re-ed Details   Marching in place with slow holds x 10 reps, sidestepping, sidestepping squats, heel walking.      Exercises   Exercises  Other Exercises    Other Exercises   single leg heel raises x 10 reps.             PT Education - 01/18/19 0816    Education Details  HEP provided    Person(s) Educated  Patient    Methods  Explanation;Demonstration;Handout    Comprehension  Returned demonstration;Verbalized understanding          PT Long Term Goals - 01/18/19 0823      PT LONG TERM GOAL #1   Title  The patient will be indep with HEP for LE strengthening, endurance training, and high level balance.    Time  4    Period  Weeks    Target Date  02/17/19      PT LONG TERM GOAL #2   Title  The patient will improve gait speed from 2.64 ft/sec to > or equal to 3.2 ft/sec to demo improving mobility.    Time  4    Period  Weeks    Target Date  02/17/19      PT LONG TERM GOAL #3   Title  The patient will improve Berg score from 53/56 up to 56/56 to demo improved high level balance control.    Time  4    Period  Weeks    Target Date  02/17/19      PT LONG TERM GOAL #4   Title  The patient will negotiate 16 steps without handrails with reciprocal  pattern indep.    Time  4    Period  Weeks    Target Date  02/17/19      PT LONG TERM GOAL #5   Title  The patient will ambulate x 20 minutes without observable limp on left side to demo improving muscular endurance.    Time  4    Period  Weeks    Target Date  02/17/19             Plan - 01/18/19 0272    Clinical Impression Statement  The patient is a 53 yo female presenting to OP  physical therapy s/p CVA with L sided weakness.  She presents today with cc: fatigue with extended gait, sensory changes, muscle weakness.  PT initiated HEP today and will treat patient at a reduced frequency (due to clinic operation changes b/c of COVID-19) to address deficits.      Personal Factors and Comorbidities  Comorbidity 1    Comorbidities  diabetes    Examination-Activity Limitations  Squat;Stairs    Examination-Participation Restrictions  Community Activity    Stability/Clinical Decision Making  Stable/Uncomplicated    Clinical Decision Making  Low    Rehab Potential  Good    PT Frequency  1x / week    PT Duration  4 weeks    PT Treatment/Interventions  ADLs/Self Care Home Management;Therapeutic activities;Therapeutic exercise;Balance training;Neuromuscular re-education;Gait training;Stair training;Functional mobility training;Patient/family education    PT Next Visit Plan  check HEP (will need to add to medbridge due to not having code); progress to higher level activities; focus on endurance    Consulted and Agree with Plan of Care  Patient       Patient will benefit from skilled therapeutic intervention in order to improve the following deficits and impairments:  Abnormal gait, Decreased balance, Decreased strength  Visit Diagnosis: Other abnormalities of gait and mobility  Muscle weakness (generalized)  Other symptoms and signs involving the nervous system     Problem List Patient Active Problem List   Diagnosis Date Noted  . Leucocytosis 12/19/2018  . Right thalamic  infarction (HCC) 12/19/2018  . Hot flashes 06/17/2015  . Preventative health care 06/17/2015  . Type 2 diabetes mellitus (HCC) 03/05/2015  . Hyperlipidemia 03/05/2015    Renda Pohlman, PT 01/18/2019, 8:31 AM  [facility] [address] [place], [place], [address] Phone: [phone]   Fax:  [phone]  Name: Haley Gomez MRN: [id number] Date of Birth: 27-Oct-1965

## 2019-01-18 NOTE — Patient Instructions (Signed)
*  Created home program in [place] and did not copy code*  Want to discuss with patient at next visit.  From memory:  Single leg heel raise x 10 reps, tandem gait, sidestepping (sidestep squats led to pain in knee) and heel walking.

## 2019-01-24 ENCOUNTER — Encounter: Payer: Self-pay | Admitting: Rehabilitative and Restorative Service Providers"

## 2019-01-24 ENCOUNTER — Other Ambulatory Visit: Payer: Self-pay

## 2019-01-24 ENCOUNTER — Ambulatory Visit: Payer: Managed Care, Other (non HMO) | Admitting: Rehabilitative and Restorative Service Providers"

## 2019-01-24 ENCOUNTER — Other Ambulatory Visit: Payer: Self-pay | Admitting: Primary Care

## 2019-01-24 ENCOUNTER — Ambulatory Visit: Payer: Managed Care, Other (non HMO) | Admitting: Occupational Therapy

## 2019-01-24 DIAGNOSIS — I639 Cerebral infarction, unspecified: Secondary | ICD-10-CM

## 2019-01-24 DIAGNOSIS — R2689 Other abnormalities of gait and mobility: Secondary | ICD-10-CM

## 2019-01-24 DIAGNOSIS — I6381 Other cerebral infarction due to occlusion or stenosis of small artery: Secondary | ICD-10-CM

## 2019-01-24 DIAGNOSIS — R29818 Other symptoms and signs involving the nervous system: Secondary | ICD-10-CM

## 2019-01-24 DIAGNOSIS — R448 Other symptoms and signs involving general sensations and perceptions: Secondary | ICD-10-CM

## 2019-01-24 DIAGNOSIS — R209 Unspecified disturbances of skin sensation: Secondary | ICD-10-CM

## 2019-01-24 DIAGNOSIS — M6281 Muscle weakness (generalized): Secondary | ICD-10-CM

## 2019-01-24 DIAGNOSIS — R278 Other lack of coordination: Secondary | ICD-10-CM

## 2019-01-24 DIAGNOSIS — R2681 Unsteadiness on feet: Secondary | ICD-10-CM

## 2019-01-24 NOTE — Patient Instructions (Signed)
Desensitization Techniques  Perform these exercises ever 2 hours for 15 minute sessions.  Progress to the next exercises when the exercises you are doing become easy.  1)  Using light pressure, rub the various textures along with the hypersensitive area:  A.  Flannel  E.  Polyester  B.  Velvet  F.  Corduroy  C.  Wool  G.  Cotton material  D.  Terry cloth  2)  With the same textures use a firmer pressure. 3)  Use a hand held vibrator and massage along the sensitive area. 4)  With a small dowel rod, eraser on a pencil or base of an ink pen tap along the sensitive area. 5)  Use an empty roll-on deodorant bottle to roll along the sensitive area. 6)  Place your hand/forearm in separate containers of the following items:  A.  Sand  D.  Dry lentil beans  B.  Dry Rice  E.  Dry kidney beans  C.  Ball bearings F.  Dry pinto beans   

## 2019-01-24 NOTE — Patient Instructions (Signed)
Access Code: [email or web address]  URL: [email or web address]  Date: 01/24/2019  Prepared by: Margretta Ditty   Exercises Heel Walking - 1 reps - 1 sets - 1-2x daily - 7x weekly Single Leg Heel Raise with Counter Support - 10 reps - 1 sets - 1-2x daily - 7x weekly Standing March - 10 reps - 1 sets - 1x daily - 7x weekly

## 2019-01-24 NOTE — Therapy (Signed)
[facility] [address] [place], [place], [address] Phone: [phone]   Fax:  [phone]  Occupational Therapy Treatment  Patient Details  Name: Haley Gomez MRN: [id number] Date of Birth: Sep 10, 1966 Referring Provider (OT): Vernona Rieger, NP   Encounter Date: 01/24/2019  OT End of Session - 01/24/19 1423    Visit Number  2    Number of Visits  16    Date for OT Re-Evaluation  03/19/19    Authorization Type  [employer] - OT/PT 30 VISIT limit - if seen on same day counts as 1 visit    OT Start Time  1315    OT Stop Time  1410    OT Time Calculation (min)  55 min    Activity Tolerance  Patient tolerated treatment well    Behavior During Therapy  [facility] for tasks assessed/performed       Past Medical History:  Diagnosis Date  . Diabetes mellitus without complication (HCC)   . Hyperlipidemia   . Right thalamic infarction [facility])     Past Surgical History:  Procedure Laterality Date  . CESAREAN SECTION    . GALLBLADDER SURGERY  1997    There were no vitals filed for this visit.  Subjective Assessment - 01/24/19 1319    Subjective   I'm dropping small things    Pertinent History  CVA 12/19/18. PMH: DM    Currently in Pain?  Yes    Pain Score  4     Pain Location  Hand    Pain Orientation  Left    Pain Descriptors / Indicators  Burning;Tightness    Pain Type  Neuropathic pain    Pain Onset  1 to 4 weeks ago    Pain Frequency  Constant    Aggravating Factors   increased activity, cold    Pain Relieving Factors  elevation, rest       Reviewed putty and coordination HEP. Issued and reviewed desensitization techniques (see pt instructions).  Issued Lt handed typing words and handouts for fine motor control (pre-writing) and precision.  Practiced tracing on vertical surface; also practiced hitting "bullseye" on vertical surface at varying levels and distances for motor control, and judging distance needed for  each target. Practiced dribbling large physioball for timing of movement and gross motor coordination. Practiced some typing with typing games (will do more following session) Fluidotherapy x 8 min. For desensitization and sensory retraining Lt hand                    OT Education - 01/24/19 1328    Education Details  desensitization techniques    Person(s) Educated  Patient    Methods  Explanation;Handout    Comprehension  Verbalized understanding       OT Short Term Goals - 01/17/19 1249      OT SHORT TERM GOAL #1   Title  Independent with initial HEP - 02/16/19    Time  4    Period  Weeks    Status  New      OT SHORT TERM GOAL #2   Title  Pt to improve coordination as evidenced by performing 9 hole peg test in 23 sec or less Lt hand    Time  4    Period  Weeks    Status  New      OT SHORT TERM GOAL #3   Title  Pt to improve grip strength Lt hand to 28 lbs  or greater to assist with opening jars/containers    Time  4    Period  Weeks    Status  New      OT SHORT TERM GOAL #4   Title  Pt to verbalize understanding with desensitization techniques     Time  4    Period  Weeks    Status  New      OT SHORT TERM GOAL #5   Title  Pt to demo writing full paragraph maintaining 90% or greater legibility in prep for work related tasks    Time  4    Period  Weeks    Status  New        OT Long Term Goals - 01/17/19 1255      OT LONG TERM GOAL #1   Title  Pt independent with updated HEP prn - 03/19/19    Time  8    Period  Weeks    Status  New      OT LONG TERM GOAL #2   Title  Grip strength Lt hand to be 40 lbs or greater to open jars/containers    Time  8    Period  Weeks    Status  New      OT LONG TERM GOAL #3   Title  Pt to return to using LUE as dominant UE 80% of the time    Time  8    Period  Weeks    Status  New      OT LONG TERM GOAL #4   Title  Pt to perform IADL tasks (cooking/cleaning) for 30 min or greater w/o rests or LOB safely      Time  8    Period  Weeks    Status  New      OT LONG TERM GOAL #5   Title  Pt to type 25 wpm or greater with minimal errors in prep for work related tasks    Time  8    Period  Weeks    Status  New            Plan - 01/24/19 1423    Clinical Impression Statement  Pt is a progressing with HEP. Pt tolerating all activities given today well    Occupational performance deficits (Please refer to evaluation for details):  ADL's;IADL's;Work    Body Structure / Function / Physical Skills  ADL;UE functional use;FMC;Mobility;Sensation;Endurance;Pain;Strength;IADL;Coordination;Proprioception    Comorbidities Affecting Occupational Performance:  None    Modification or Assistance to Complete Evaluation   Min-Moderate modification of tasks or assist with assess necessary to complete eval    OT Frequency  2x / week    OT Duration  8 weeks    OT Treatment/Interventions  Self-care/ADL training;Therapeutic exercise;Coping strategies training;Moist Heat;Paraffin;Neuromuscular education;Patient/family education;Energy conservation;Fluidtherapy;Therapeutic activities;DME and/or AE instruction;Manual Therapy;Passive range of motion    Plan  continue coordination, practice more typing, continue fluidotherapy for desensitization if pt wishes    Consulted and Agree with Plan of Care  Patient       Patient will benefit from skilled therapeutic intervention in order to improve the following deficits and impairments:  Body Structure / Function / Physical Skills  Visit Diagnosis: Other lack of coordination  Muscle weakness (generalized)  Altered sensation due to acute stroke [facility])    Problem List Patient Active Problem List   Diagnosis Date Noted  . Leucocytosis 12/19/2018  . Right thalamic infarction (HCC) 12/19/2018  . Hot flashes 06/17/2015  .  Preventative health care 06/17/2015  . Type 2 diabetes mellitus (HCC) 03/05/2015  . Hyperlipidemia 03/05/2015    Kelli ChurnBallie, Jazman Reuter Johnson,  OTR/L 01/24/2019, 2:25 PM  [facility] [address] [place], [place], [address] Phone: [phone]   Fax:  [phone]  Name: Cherlyn CushingMichelle G Lynds MRN: [id number] Date of Birth: [birth year]

## 2019-01-24 NOTE — Therapy (Signed)
[facility] [address] [place], [place], [address] Phone: [phone]   Fax:  [phone]  Physical Therapy Treatment  Patient Details  Name: Haley CushingMichelle G Gomez MRN: [id number] Date of Birth: [birth year] No data recorded  Encounter Date: 01/24/2019  PT End of Session - 01/24/19 1240    Visit Number  2    Number of Visits  4    Date for PT Re-Evaluation  02/17/19    Authorization Type  Cigna    PT Start Time  1239    PT Stop Time  1319    PT Time Calculation (min)  40 min    Activity Tolerance  Patient tolerated treatment well    Behavior During Therapy  [facility] for [facility] assessed/performed       Past Medical History:  Diagnosis Date  . Diabetes mellitus without complication (HCC)   . Hyperlipidemia   . Right thalamic infarction [facility](HCC)     Past Surgical History:  Procedure Laterality Date  . CESAREAN SECTION    . GALLBLADDER SURGERY  1997    There were no vitals filed for this visit.   CLINIC OPERATION CHANGES: Outpatient [facility] Rehabilitation Clinic is operating at a low capacity due to COVID-19.  The patient was brought into the clinic for evaluation and/or treatment following universal masking by staff, social distancing, and <10 people in the clinic.  The patient's COVID risk of complications score is 1.  Subjective Assessment - 01/24/19 1238    Subjective  The patient is performing HEP regularly.  She still notes continued fatigue and some difficulty with heel walking.    Pertinent History  diabetes, hyperlipidemia.    Patient Stated Goals  Improve endurance, reduce "stumbling" when fatigued.     Currently in Pain?  No/denies                       [facility] Adult PT Treatment/Exercise - 01/24/19 1500      Ambulation/Gait   Ambulation/Gait  Yes    Ambulation/Gait Assistance  7: Independent    Gait Comments  Gait on level surfaces emphasizing exaggerated heel strike x 300 ft x 3 reps.   Treadmill gait increasing incline up to 5% and ambulating up to 1.8 mph with UE support x 5 minutes.  Heel and toe walking with supervision.      Neuro Re-ed    Neuro Re-ed Details   Compliant surface standing with head motion, UE reaching and eyes closed.  Rocker board with reactive balance activities with eyes open and eyes closed.  Standing step downs from rocker board.  Foot taps to cones from compliant foam surface.  Patient required supervision to CGA on high level balance activities.        Exercises   Exercises  Other Exercises    Other Exercises   Step ups to 12" height step with R and then L LE x 5 reps with UE support, lunges anteriorly then stepping to midline.  Sidestepping squats x 6 reps R and L adding overhead reaching for postural strengthening.                  PT Long Term Goals - 01/18/19 [id number]      PT LONG TERM GOAL #1   Title  The patient will be indep with HEP for LE strengthening, endurance training, and high level balance.    Time  4    Period  Weeks    Target  Date  02/17/19      PT LONG TERM GOAL #2   Title  The patient will improve gait speed from 2.64 ft/sec to > or equal to 3.2 ft/sec to demo improving mobility.    Time  4    Period  Weeks    Target Date  02/17/19      PT LONG TERM GOAL #3   Title  The patient will improve Berg score from 53/56 up to 56/56 to demo improved high level balance control.    Time  4    Period  Weeks    Target Date  02/17/19      PT LONG TERM GOAL #4   Title  The patient will negotiate 16 steps without handrails with reciprocal pattern indep.    Time  4    Period  Weeks    Target Date  02/17/19      PT LONG TERM GOAL #5   Title  The patient will ambulate x 20 minutes without observable limp on left side to demo improving muscular endurance.    Time  4    Period  Weeks    Target Date  02/17/19            Plan - 01/24/19 2221    Clinical Impression Statement  Today's session emphasized high level  balance activities and endurance with gait for improving mobility.  The patient has not had time between eval and today to work on HEP extensively, therefore, did not progress activities.  PT encouraged daily walking for endurance.     PT Treatment/Interventions  ADLs/Self Care Home Management;Therapeutic activities;Therapeutic exercise;Balance training;Neuromuscular re-education;Gait training;Stair training;Functional mobility training;Patient/family education    PT Next Visit Plan  Check HEP and progress as able, high level balance activities, endurance, dynamic gait and extended ambulation.    Consulted and Agree with Plan of Care  Patient       Patient will benefit from skilled therapeutic intervention in order to improve the following deficits and impairments:  Abnormal gait, Decreased balance, Decreased strength  Visit Diagnosis: Muscle weakness (generalized)  Unsteadiness on feet  Other abnormalities of gait and mobility  Other symptoms and signs involving the nervous system     Problem List Patient Active Problem List   Diagnosis Date Noted  . Leucocytosis 12/19/2018  . Right thalamic infarction (HCC) 12/19/2018  . Hot flashes 06/17/2015  . Preventative health care 06/17/2015  . Type 2 diabetes mellitus (HCC) 03/05/2015  . Hyperlipidemia 03/05/2015    Haley Gomez, PT 01/24/2019, 10:30 PM  [facility] [address] [place], [place], [address] Phone: [phone]   Fax:  [phone]  Name: Haley Gomez MRN: [id number] Date of Birth: 1966/07/09

## 2019-01-28 ENCOUNTER — Ambulatory Visit: Payer: Managed Care, Other (non HMO) | Admitting: Rehabilitative and Restorative Service Providers"

## 2019-01-28 ENCOUNTER — Ambulatory Visit: Payer: Managed Care, Other (non HMO) | Attending: Primary Care | Admitting: Occupational Therapy

## 2019-01-28 ENCOUNTER — Other Ambulatory Visit: Payer: Self-pay

## 2019-01-28 ENCOUNTER — Encounter: Payer: Self-pay | Admitting: Occupational Therapy

## 2019-01-28 ENCOUNTER — Encounter: Payer: Self-pay | Admitting: Rehabilitative and Restorative Service Providers"

## 2019-01-28 DIAGNOSIS — R278 Other lack of coordination: Secondary | ICD-10-CM | POA: Diagnosis present

## 2019-01-28 DIAGNOSIS — R2681 Unsteadiness on feet: Secondary | ICD-10-CM | POA: Diagnosis present

## 2019-01-28 DIAGNOSIS — M6281 Muscle weakness (generalized): Secondary | ICD-10-CM | POA: Diagnosis present

## 2019-01-28 DIAGNOSIS — R2689 Other abnormalities of gait and mobility: Secondary | ICD-10-CM

## 2019-01-28 DIAGNOSIS — R209 Unspecified disturbances of skin sensation: Secondary | ICD-10-CM | POA: Diagnosis present

## 2019-01-28 DIAGNOSIS — I639 Cerebral infarction, unspecified: Secondary | ICD-10-CM | POA: Insufficient documentation

## 2019-01-28 DIAGNOSIS — R29818 Other symptoms and signs involving the nervous system: Secondary | ICD-10-CM | POA: Insufficient documentation

## 2019-01-28 NOTE — Therapy (Signed)
[facility] [address] [place], [place], [address] Phone: [phone]   Fax:  [phone]  Physical Therapy Treatment  Patient Details  Name: Haley Gomez MRN: [id number] Date of Birth: 1966-08-01 No data recorded  Encounter Date: 01/28/2019  PT End of Session - 01/28/19 1154    Visit Number  3    Number of Visits  4    Date for PT Re-Evaluation  02/17/19    Authorization Type  [employer]    PT Start Time  1153    PT Stop Time  1220    PT Time Calculation (min)  27 min    Activity Tolerance  Patient tolerated treatment well    Behavior During Therapy  [facility] for tasks assessed/performed       Past Medical History:  Diagnosis Date  . Diabetes mellitus without complication ([place])   . Hyperlipidemia   . Right thalamic infarction [facility])     Past Surgical History:  Procedure Laterality Date  . CESAREAN SECTION    . GALLBLADDER SURGERY  1997   CLINIC OPERATION CHANGES: [facility] is operating at a low capacity due to COVID-19.  The patient was brought into the clinic for evaluation and/or treatment following universal masking by staff, social distancing, and <10 people in the clinic.  The patient's COVID risk of complications score is 1.  There were no vitals filed for this visit.  Subjective Assessment - 01/28/19 1154    Subjective  The patient reports she has done the exercises.  She was tired and a little sore after last visit.    Pertinent History  diabetes, hyperlipidemia.    Patient Stated Goals  Improve endurance, reduce "stumbling" when fatigued.     Currently in Pain?  No/denies         [facility] PT Assessment - 01/28/19 1213      Standardized Balance Assessment   Standardized Balance Assessment  Berg Balance Test      Berg Balance Test   Sit to Stand  Able to stand without using hands and stabilize independently    Standing Unsupported  Able to stand safely 2 minutes     Sitting with Back Unsupported but Feet Supported on Floor or Stool  Able to sit safely and securely 2 minutes    Stand to Sit  Sits safely with minimal use of hands    Transfers  Able to transfer safely, minor use of hands    Standing Unsupported with Eyes Closed  Able to stand 10 seconds safely    Standing Unsupported with Feet Together  Able to place feet together independently and stand 1 minute safely    From Standing, Reach Forward with Outstretched Arm  Can reach confidently >25 cm (10")    From Standing Position, Pick up Object from Floor  Able to pick up shoe safely and easily    From Standing Position, Turn to Look Behind Over each Shoulder  Looks behind from both sides and weight shifts well    Turn 360 Degrees  Able to turn 360 degrees safely in 4 seconds or less    Standing Unsupported, Alternately Place Feet on Step/Stool  Able to stand independently and safely and complete 8 steps in 20 seconds    Standing Unsupported, One Foot in Front  Able to place foot tandem independently and hold 30 seconds    Standing on One Leg  Able to lift leg independently and hold > 10 seconds  Total Score  56    Berg comment:  56/56                   [facility] Adult PT Treatment/Exercise - 01/28/19 1213      Ambulation/Gait   Ambulation/Gait  Yes    Ambulation/Gait Assistance  7: Independent    Ambulation Distance (Feet)  1500 Feet    Assistive device  None    Ambulation Surface  Level;Unlevel;Indoor;Outdoor    Gait velocity  3.29 ft/sec    Stairs  Yes    Stairs Assistance  7: Independent    Number of Stairs  16    Gait Comments  Outdoor ambulation working on walking inp arking lots, inclines/declines, extended gait.  Indoor walking on heels and toes x 50 feet with some dyscoordination noted in left ankle.        Self-Care   Self-Care  Other Self-Care Comments    Other Self-Care Comments   Discussed continuation of HEP, extended gait for continued progress.                    PT Long Term Goals - 01/28/19 1203      PT LONG TERM GOAL #1   Title  The patient will be indep with HEP for LE strengthening, endurance training, and high level balance.    Time  4    Period  Weeks    Status  On-going      PT LONG TERM GOAL #2   Title  The patient will improve gait speed from 2.64 ft/sec to > or equal to 3.2 ft/sec to demo improving mobility.    Baseline  3.29 ft/sec    Time  4    Period  Weeks    Status  Achieved      PT LONG TERM GOAL #3   Title  The patient will improve Berg score from 53/56 up to 56/56 to demo improved high level balance control.    Baseline  56/56 on 01/28/2019    Time  4    Period  Weeks    Status  Achieved      PT LONG TERM GOAL #4   Title  The patient will negotiate 16 steps without handrails with reciprocal pattern indep.    Baseline  no handrail with alternating pattern indep.    Time  4    Period  Weeks    Status  Achieved      PT LONG TERM GOAL #5   Title  The patient will ambulate x 20 minutes without observable limp on left side to demo improving muscular endurance.    Baseline  The patient is able to ambulate > 10 minutes outdoor surfaces without evidence of foot drag or fatigue.     Time  4    Period  Weeks    Status  On-going            Plan - 01/28/19 1625    Clinical Impression Statement  The patient met 3/5 LTGs.  PT and patient discussed continued work/focus on HEP and endurance training.  PT to f/u in 2 weeks and reassess remaining 2 goals to determine d/c.     PT Treatment/Interventions  ADLs/Self Care Home Management;Therapeutic activities;Therapeutic exercise;Balance training;Neuromuscular re-education;Gait training;Stair training;Functional mobility training;Patient/family education    PT Next Visit Plan  Check goals, d/c if doing well.  Check endurance and HEP.    PT Home Exercise Plan  [id number]  *I did not  copy first HEP code.  These are the 3 exercises initially prescribed, however  her code will be different.    Consulted and Agree with Plan of Care  Patient       Patient will benefit from skilled therapeutic intervention in order to improve the following deficits and impairments:  Abnormal gait, Decreased balance, Decreased strength  Visit Diagnosis: Muscle weakness (generalized)  Unsteadiness on feet  Other abnormalities of gait and mobility     Problem List Patient Active Problem List   Diagnosis Date Noted  . Leucocytosis 12/19/2018  . Right thalamic infarction ([place]) 12/19/2018  . Hot flashes 06/17/2015  . Preventative health care 06/17/2015  . Type 2 diabetes mellitus ([place]) 03/05/2015  . Hyperlipidemia 03/05/2015    Rishaan Gunner, PT 01/28/2019, 4:27 PM  [facility] [address] [place], [place], [address] Phone: [phone]   Fax:  [phone]  Name: CORRENA MEACHAM MRN: [id number] Date of Birth: 06-27-1966

## 2019-01-28 NOTE — Therapy (Signed)
[facility] [address] [place], [place], [address] Phone: [phone]   Fax:  [phone]  Occupational Therapy Treatment  Patient Details  Name: Haley Gomez MRN: [id number] Date of Birth: 1965-12-08 Referring Provider (OT): Vernona Rieger, NP   Encounter Date: 01/28/2019  OT End of Session - 01/28/19 1242    Visit Number  3    Number of Visits  16    Authorization Type  [employer] - OT/PT 30 VISIT limit - if seen on same day counts as 1 visit    OT Start Time  1102    OT Stop Time  1144    OT Time Calculation (min)  42 min    Activity Tolerance  Patient tolerated treatment well       Past Medical History:  Diagnosis Date  . Diabetes mellitus without complication ([facility])   . Hyperlipidemia   . Right thalamic infarction [facility])     Past Surgical History:  Procedure Laterality Date  . CESAREAN SECTION    . GALLBLADDER SURGERY  1997    There were no vitals filed for this visit.  Subjective Assessment - 01/28/19 1104    Subjective   My hand doesn't truly hurt I am just aware of that sensation and it gets worse when I use the hand.     Pertinent History  CVA 12/19/18. PMH: DM    Currently in Pain?  Yes    Pain Score  4    however pt states it isn't painful just present and "I am aware of it"    Pain Location  Hand    Pain Orientation  Left    Pain Descriptors / Indicators  Burning;Tightness    Pain Type  Neuropathic pain    Pain Onset  1 to 4 weeks ago    Pain Frequency  Constant    Aggravating Factors   increased activity, cold    Pain Relieving Factors  rest, elevation                   OT Treatments/Exercises (OP) - 01/28/19 0001      ADLs   Writing  Adressed writing with pt - pt states when she writes at home her hand gets "tight" and neuropathy increases. Pt with dififculty grading how hard she is holding pen. Practiced with pen gripper as well as coban wrap on pen - pt preferred pen  gripper which was issued to pt for home use. Pt to continue practice writing at home using gipper.       Neurological Re-education Exercises   Other Exercises 1  Gripper on #2 to pick up 1 inch blocks. Pt with min drops.  Neuropathy increased to  5 /10 with activity. Pt with 3 rest breaks during activity. Addressed in hand manipulation using grooved peg board. Pt able to complete task with increased time.  Pt able to feel shape of peg however more dulled than R hand.  Also addressed using [place] Patient dexterity (without use of tweezers ) - pt with min dropping and increased time.        Modalities   Modalities  Fluidotherapy      LUE Fluidotherapy   Number Minutes Fluidotherapy  8 Minutes    LUE Fluidotherapy Location  Hand    Comments  Pt with pain rated as 5/10 after activity/before fluido. At end of session pt rated discomfort  3 /10 after fluido.  OT Short Term Goals - 01/28/19 1142      OT SHORT TERM GOAL #1   Title  Independent with initial HEP - 02/16/19    Time  4    Period  Weeks    Status  On-going      OT SHORT TERM GOAL #2   Title  Pt to improve coordination as evidenced by performing 9 hole peg test in 23 sec or less Lt hand    Time  4    Period  Weeks    Status  On-going      OT SHORT TERM GOAL #3   Title  Pt to improve grip strength Lt hand to 28 lbs or greater to assist with opening jars/containers    Time  4    Period  Weeks    Status  On-going      OT SHORT TERM GOAL #4   Title  Pt to verbalize understanding with desensitization techniques     Time  4    Period  Weeks    Status  Achieved      OT SHORT TERM GOAL #5   Title  Pt to demo writing full paragraph maintaining 90% or greater legibility in prep for work related tasks    Time  4    Period  Weeks    Status  On-going        OT Long Term Goals - 01/28/19 1142      OT LONG TERM GOAL #1   Title  Pt independent with updated HEP prn - 03/19/19    Time  8    Period  Weeks     Status  New      OT LONG TERM GOAL #2   Title  Grip strength Lt hand to be 40 lbs or greater to open jars/containers    Time  8    Period  Weeks    Status  New      OT LONG TERM GOAL #3   Title  Pt to return to using LUE as dominant UE 80% of the time    Time  8    Period  Weeks    Status  New      OT LONG TERM GOAL #4   Title  Pt to perform IADL tasks (cooking/cleaning) for 30 min or greater w/o rests or LOB safely     Time  8    Period  Weeks    Status  New      OT LONG TERM GOAL #5   Title  Pt to type 25 wpm or greater with minimal errors in prep for work related tasks    Time  8    Period  Weeks    Status  New            Plan - 01/28/19 1142    Clinical Impression Statement  Pt progressing toward goals. Pt reports she is trying to use her L hand as much as possible but neuropathy increases with functional use. Pt to see neurology next week and will discuss then.     OT Occupational Profile and History  Detailed Assessment- Review of Records and additional review of physical, cognitive, psychosocial history related to current functional performance    Occupational performance deficits (Please refer to evaluation for details):  ADL's;IADL's;Work    Body Structure / Function / Physical Skills  ADL;UE functional use;FMC;Mobility;Sensation;Endurance;Pain;Strength;IADL;Coordination;Proprioception    Rehab Potential  Good    Comorbidities Affecting  Occupational Performance:  None    Modification or Assistance to Complete Evaluation   Min-Moderate modification of tasks or assist with assess necessary to complete eval    OT Frequency  2x / week    OT Duration  8 weeks    OT Treatment/Interventions  Self-care/ADL training;Therapeutic exercise;Coping strategies training;Moist Heat;Paraffin;Neuromuscular education;Patient/family education;Energy conservation;Fluidtherapy;Therapeutic activities;DME and/or AE instruction;Manual Therapy;Passive range of motion    Plan  continue  coordination, grip strength, practice more typing, functional use, continue fluidotherapy for desensitization if pt wishes       Patient will benefit from skilled therapeutic intervention in order to improve the following deficits and impairments:  Body Structure / Function / Physical Skills  Visit Diagnosis: Muscle weakness (generalized)  Other symptoms and signs involving the nervous system  Other lack of coordination    Problem List Patient Active Problem List   Diagnosis Date Noted  . Leucocytosis 12/19/2018  . Right thalamic infarction ([facility]) 12/19/2018  . Hot flashes 06/17/2015  . Preventative health care 06/17/2015  . Type 2 diabetes mellitus ([facility]) 03/05/2015  . Hyperlipidemia 03/05/2015    Norton Pastel, OTR/L 01/28/2019, 12:44 PM  [facility] [address] [place], [place], [address] Phone: [phone]   Fax:  [phone]  Name: Haley Gomez MRN: [id number] Date of Birth: 03/29/1966

## 2019-01-31 ENCOUNTER — Telehealth: Payer: Self-pay

## 2019-01-31 NOTE — Telephone Encounter (Signed)
I called pt that due to COVID 19 we are doing video visits.Pt stated her PCP told her that a new pt has to be seen in the office and it cant be video. I stated our office can do video visit for the pt. She gave verbal consent to do video and file her insurance. I updated chart, meds, and pharmacy. Pt is only on aspirin not plavix. I explain provider will send text to her 5 to 10 minutes prior and she will click on the link and type in first and last name and make sure audio is on. Pt verbalized understanding.

## 2019-02-05 ENCOUNTER — Ambulatory Visit: Payer: Managed Care, Other (non HMO) | Admitting: Occupational Therapy

## 2019-02-05 ENCOUNTER — Other Ambulatory Visit: Payer: Self-pay | Admitting: Primary Care

## 2019-02-05 ENCOUNTER — Other Ambulatory Visit: Payer: Self-pay

## 2019-02-05 ENCOUNTER — Ambulatory Visit: Payer: Managed Care, Other (non HMO) | Admitting: Physical Therapy

## 2019-02-05 ENCOUNTER — Ambulatory Visit (INDEPENDENT_AMBULATORY_CARE_PROVIDER_SITE_OTHER): Payer: Managed Care, Other (non HMO) | Admitting: Adult Health

## 2019-02-05 ENCOUNTER — Encounter: Payer: Self-pay | Admitting: Adult Health

## 2019-02-05 DIAGNOSIS — G890 Central pain syndrome: Secondary | ICD-10-CM | POA: Diagnosis not present

## 2019-02-05 DIAGNOSIS — E1159 Type 2 diabetes mellitus with other circulatory complications: Secondary | ICD-10-CM

## 2019-02-05 DIAGNOSIS — E785 Hyperlipidemia, unspecified: Secondary | ICD-10-CM

## 2019-02-05 DIAGNOSIS — I639 Cerebral infarction, unspecified: Secondary | ICD-10-CM

## 2019-02-05 DIAGNOSIS — I6381 Other cerebral infarction due to occlusion or stenosis of small artery: Secondary | ICD-10-CM

## 2019-02-05 DIAGNOSIS — I10 Essential (primary) hypertension: Secondary | ICD-10-CM

## 2019-02-05 MED ORDER — AMITRIPTYLINE HCL 10 MG PO TABS: 10.0000 mg | ORAL_TABLET | Freq: Every day | ORAL | 3 refills | Status: DC

## 2019-02-05 NOTE — Progress Notes (Signed)
[facility] [address] [place]. [place] [address] [phone]       VIRTUAL VISIT FOLLOW UP NOTE  [name] Date of Birth:  May 08, 1966 Medical Record Number:  [id number]   Reason for Referral:  hospital stroke follow up    Virtual Visit via Video Note  I connected with Haley Gomez on 02/05/19 at  8:15 AM EDT by a video enabled telemedicine application located remotely in my own home and verified that I am speaking with the correct person using two identifiers who was located at their own home.   Visit scheduled by Katharine Look, RN. She discussed the limitations of evaluation and management by telemedicine and the availability of in person appointments. The patient expressed understanding and agreed to proceed.Please see telephone note for additional scheduling information and consent.    CHIEF COMPLAINT:  Chief Complaint  Patient presents with  . 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 History obtained from patient and chart review. Reviewed all radiology images and labs personally.  [name] is a 53 y.o. female with history of DB and HTN  who presented with L face and arm numbness.  Stroke work-up revealed right thalamic infarct as evidenced on MRI secondary to small vessel disease source.  CTA showed slight proximal right P2 narrowing.  2D echo unremarkable.  LDL 199 -increase atorvastatin dose 20 mg to 80 mg daily and A1c 12.5 -recommended close PCP follow-up at discharge.  HTN stable.  Other stroke risk factors include EtOH use and family history of stroke (father) but no personal history of stroke.  Recommended DAPT for 3 weeks and aspirin alone.  Residual deficits  decreased left-sided sensation and was discharged home in stable condition without therapy needs.  She continues to have residual left-sided numbness and paresthesias.  As she is left-hand dominant, this is caused her to have difficulty performing activities such as writing or using a keyboard.  Due to this, she has not returned to work at this time and is currently receiving short-term disability through her PCP.  PCP initiated gabapentin 100 mg 3 times daily without benefit.  She describes pain as a burning sensation and tightness which worsens with activity.  She continues to participate in PT/OT which she believes has been helping.  Mild sensation of daytime fatigue/mind fogginess throughout the day.  Completed 3 weeks DAPT and continues on aspirin alone without side effects of bleeding or bruising.  Continues on atorvastatin 80 mg without side effects of myalgias.  Continues to follow with PCP for HTN and DM management.  No further concerns at this time.  Denies new or worsening stroke/TIA symptoms.      ROS:   14 system review of systems performed and negative with exception of numbness, tingling and pain  PMH:  Past Medical History:  Diagnosis Date  . Diabetes mellitus without complication ([place])   . Hyperlipidemia   . Right thalamic infarction [facility])     PSH:  Past Surgical History:  Procedure Laterality Date  . CESAREAN SECTION    . GALLBLADDER SURGERY  1997    Social History:  Social History   Socioeconomic History  . Marital status: Divorced    Spouse name: Not on file  .  Number of children: Not on file  . Years of education: Not on file  . Highest education level: Not on file  Occupational History  . Not on file  Social Needs  . Financial resource strain: Not on file  . Food insecurity:    Worry: Not on file    Inability: Not on file  . Transportation needs:    Medical: Not on file    Non-medical: Not on file  Tobacco Use  . Smoking status: Never Smoker  .  Smokeless tobacco: Never Used  Substance and Sexual Activity  . Alcohol use: Yes    Alcohol/week: 0.0 standard drinks    Comment: social  . Drug use: Never  . Sexual activity: Not on file  Lifestyle  . Physical activity:    Days per week: Not on file    Minutes per session: Not on file  . Stress: Not on file  Relationships  . Social connections:    Talks on phone: Not on file    Gets together: Not on file    Attends religious service: Not on file    Active member of club or organization: Not on file    Attends meetings of clubs or organizations: Not on file    Relationship status: Not on file  . Intimate partner violence:    Fear of current or ex partner: Not on file    Emotionally abused: Not on file    Physically abused: Not on file    Forced sexual activity: Not on file  Other Topics Concern  . Not on file  Social History Narrative   Single.   3 children.   Works as a Marine scientist at [employer]   Enjoys traveling, spending time with friends, exercising, reading.    Family History:  Family History  Problem Relation Age of Onset  . Diabetes Mother   . Alcohol abuse Father   . Hypertension Father   . Kidney disease Father   . Stroke Father     Medications:   Current Outpatient Medications on File Prior to Visit  Medication Sig Dispense Refill  . aspirin EC 81 MG EC tablet Take 1 tablet (81 mg total) by mouth daily. 30 tablet 3  . atorvastatin (LIPITOR) 80 MG tablet Take 1 tablet (80 mg total) by mouth daily at 6 PM. 30 tablet 3  . blood glucose meter kit and supplies KIT Dispense based on patient and insurance preference. Use up to four times daily as directed. (FOR ICD-9 250.00, 250.01). 1 each 0  . clopidogrel (PLAVIX) 75 MG tablet Take 1 tablet (75 mg total) by mouth daily. Take for 21 days then stop 21 tablet 0  . diphenhydrAMINE (BENADRYL) 25 MG tablet Take 25 mg by mouth every 6 (six) hours as needed for sleep.    Marland Kitchen gabapentin (NEURONTIN) 100 MG capsule Take 1 capsule  (100 mg total) by mouth 3 (three) times daily. For nerve pain. 90 capsule 0  . glipiZIDE (GLUCOTROL XL) 10 MG 24 hr tablet TAKE 1 TABLET BY MOUTH EVERY DAY WITH BREAKFAST   NEED OFFICE VISIT FOR FURTHER REFILLS 30 tablet 3  . Insulin Glargine (LANTUS) 100 UNIT/ML Solostar Pen Inject 10 Units into the skin daily. 15 mL 3  . Insulin Pen Needle 32G X 4 MM MISC Use with insulin pen 100 each 1  . metFORMIN (GLUCOPHAGE) 1000 MG tablet Take 1 tablet (1,000 mg total) by mouth 2 (two) times daily. Resume 12/24/2018 60 tablet 3   No  current facility-administered medications on file prior to visit.     Allergies:   Allergies  Allergen Reactions  . Bactrim [Sulfamethoxazole-Trimethoprim] Nausea Only     Physical Exam  General: well developed, well nourished, seated, in no evident distress Head: head normocephalic and atraumatic.     Neurologic Exam Mental Status: Awake and fully alert. Oriented to place and time. Recent and remote memory intact. Attention span, concentration and fund of knowledge appropriate. Mood and affect appropriate.  Cranial Nerves: Extraocular movements full without nystagmus. Visual fields full to confrontation. Hearing intact to voice. Facial sensation intact. Face, tongue, palate moves normally and symmetrically.  Motor: No evidence of weakness per drift assessment Sensory.:  UTA but subjectively reports numbness/tingling/paresthesias of left upper and lower extremity Coordination: Rapid alternating movements normal in all extremities. Finger-to-nose and heel-to-shin performed accurately bilaterally. Gait and Station: Arises from chair without difficulty. Stance is normal. Gait demonstrates normal stride length and balance. Able to heel, toe and tandem walk without difficulty.  Reflexes: UTA   NIHSS  0-1 (possible decrease sensation) Modified Rankin  2   Diagnostic Data (Labs, Imaging, Testing)  CT ANGIO HEAD W OR WO CONTRAST CT ANGIO NECK W OR WO CONTRAST  12/20/2018 IMPRESSION: IMPRESSION: No extracranial or intracranial stenosis of significance. Slight narrowing of proximal P2 segment RIGHT PCA, appears similar to priors.  MR BRAIN WO CONTRAST 12/19/2018 1. Small acute infarct of the dorsolateral right thalamus, in close proximity to the posterior limb of the internal capsule, in keeping with the reported left-sided symptoms. 2. Otherwise normal MRI of the brain.  ECHOCARDIOGRAM 12/20/2018 IMPRESSIONS    1. The left ventricle has normal systolic function with an ejection fraction of 60-65%. The cavity size was normal. Left ventricular diastolic parameters were normal. No evidence of left ventricular regional wall motion abnormalities.  2. The right ventricle has normal systolic function. The cavity was normal. There is no increase in right ventricular wall thickness.  3. The aortic valve is tricuspid.  4. No intracardiac thrombi or masses were visualized.    ASSESSMENT: Haley Gomez is a 53 y.o. year old female here with right thalamic infarct on 12/19/2018 secondary to small vessel disease. Vascular risk factors include HTN, HLD and DM.  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    PLAN:  1. Right thalamic infarct: Continue aspirin 81 mg daily  and atorvastatin 80 mg for secondary stroke prevention. Maintain strict control of hypertension with blood pressure goal below 130/90, diabetes with hemoglobin A1c goal below 6.5% and cholesterol with LDL cholesterol (bad cholesterol) goal below 70 mg/dL.  I also advised the patient to eat a healthy diet with plenty of whole grains, cereals, fruits and vegetables, exercise regularly with at least 30 minutes of continuous activity daily and maintain ideal body weight. 2. Left paresthesias: Initiate amitriptyline 10 mg nightly and advised to call after 1 week for possible need of increasing dose.  Continue gabapentin and recommended taking  300 mg daily dose at bedtime to help with daytime drowsiness/mental fogging.  Recommend to hold off on returning to work until improvement of left hand use and ability to tolerate activity.  Discussion regarding thalamic pain syndrome and difficulty with treatment and will likely need to increase amitriptyline further if tolerating well or change regimen if needed.  Currently, short-term disability in effect until the end of this month.  Unable to determine at this time if extension is needed as this will depend on  potential benefit of amitriptyline.  Once cleared to return to work, it would be recommended to return on a part-time basis to limit overexertion and activity of LUE 3. HTN: Advised to continue current treatment regimen.  Advised to continue to monitor at home along with continued follow-up with PCP for management 4. HLD: Advised to continue current treatment regimen along with continued follow-up with PCP for future prescribing and monitoring of lipid panel 5. DMII: Advised to continue to monitor glucose levels at home along with continued follow-up with PCP for management and monitoring 6. Declines sleep apnea symptoms such as snoring, daytime fatigue or insomnia -no indication on sleep referral at this time    Follow up in 2 months or call earlier if needed   Greater than 50% of time during this 45 minute non-face-to-face visit was spent on counseling, explanation of diagnosis of right thalamic infarct, long discussion and education regarding thalamic pain syndrome and current treatment strategies, reviewing risk factor management of HTN, HLD and DM, planning of further management along with potential future management, and answering all questions to patient satisfaction    Venancio Poisson, AGNP-BC  [facility] [address] [place], [place] [address]  Phone [phone] Fax [phone] Note: This document was prepared with digital dictation  and possible smart phrase technology. Any transcriptional errors that result from this process are unintentional.

## 2019-02-05 NOTE — Progress Notes (Signed)
I agree with the above plan 

## 2019-02-06 NOTE — Telephone Encounter (Signed)
Last prescribed on 12/31/2018. Last office visit on 12/31/2018. Next future appointment on 03/27/2019

## 2019-02-06 NOTE — Telephone Encounter (Signed)
Please send to neurology

## 2019-02-07 ENCOUNTER — Other Ambulatory Visit: Payer: Self-pay

## 2019-02-07 ENCOUNTER — Ambulatory Visit: Payer: Managed Care, Other (non HMO) | Admitting: Dietician

## 2019-02-07 ENCOUNTER — Ambulatory Visit: Payer: Managed Care, Other (non HMO) | Admitting: Occupational Therapy

## 2019-02-07 DIAGNOSIS — M6281 Muscle weakness (generalized): Secondary | ICD-10-CM

## 2019-02-07 DIAGNOSIS — R278 Other lack of coordination: Secondary | ICD-10-CM

## 2019-02-07 DIAGNOSIS — I639 Cerebral infarction, unspecified: Secondary | ICD-10-CM

## 2019-02-07 DIAGNOSIS — R448 Other symptoms and signs involving general sensations and perceptions: Secondary | ICD-10-CM

## 2019-02-07 NOTE — Telephone Encounter (Signed)
Will refused. I have called [facility] and spoken to pharmacist  to send this request to George Hugh, AGNP-BC at [facility]

## 2019-02-07 NOTE — Therapy (Signed)
[facility] [place] [facility] [address] [place], [place], [address] Phone: [phone]   Fax:  [phone]  Occupational Therapy Treatment  Patient Details  Name: AMERAH BLOMGREN MRN: [id number] Date of Birth: [birth year] Referring Provider (OT): Vernona Rieger, NP   Encounter Date: 02/07/2019  OT End of Session - 02/07/19 1210    Visit Number  4    Number of Visits  16    Date for OT Re-Evaluation  03/19/19    Authorization Type  [employer] - OT/PT 30 VISIT limit - if seen on same day counts as 1 visit    OT Start Time  1145    OT Stop Time  1230    OT Time Calculation (min)  45 min    Activity Tolerance  Patient tolerated treatment well    Behavior During Therapy  [facility] for tasks assessed/performed       Past Medical History:  Diagnosis Date  . Diabetes mellitus without complication (HCC)   . Hyperlipidemia   . Right thalamic infarction [facility])     Past Surgical History:  Procedure Laterality Date  . CESAREAN SECTION    . GALLBLADDER SURGERY  1997    There were no vitals filed for this visit.  Subjective Assessment - 02/07/19 1151    Subjective   The doctor put me on a new medicine but I've only had 2 doses    Pertinent History  CVA 12/19/18. PMH: DM    Currently in Pain?  Yes    Pain Score  4     Pain Location  Hand    Pain Orientation  Left    Pain Descriptors / Indicators  Burning    Pain Type  Neuropathic pain    Pain Onset  1 to 4 weeks ago    Pain Frequency  Constant    Aggravating Factors   increased activity, cold    Pain Relieving Factors  rest, elevation       Fluidotherapy x 12 min for desensitization and to help with neuropathic pain. Typing test: 32 wpm, 78% accuracy. Practiced typing games ("clouds"). Pt issued Lt handed typing words. Pt has most difficulty using index finger d/t lack of sensation. Recommended textured stickers on keys that require Lt index finger Placing key shaped pegs in pegboard,  then removing with tweezers for graded tip pinch.                       OT Short Term Goals - 01/28/19 1142      OT SHORT TERM GOAL #1   Title  Independent with initial HEP - 02/16/19    Time  4    Period  Weeks    Status  On-going      OT SHORT TERM GOAL #2   Title  Pt to improve coordination as evidenced by performing 9 hole peg test in 23 sec or less Lt hand    Time  4    Period  Weeks    Status  On-going      OT SHORT TERM GOAL #3   Title  Pt to improve grip strength Lt hand to 28 lbs or greater to assist with opening jars/containers    Time  4    Period  Weeks    Status  On-going      OT SHORT TERM GOAL #4   Title  Pt to verbalize understanding with desensitization techniques     Time  4  Period  Weeks    Status  Achieved      OT SHORT TERM GOAL #5   Title  Pt to demo writing full paragraph maintaining 90% or greater legibility in prep for work related tasks    Time  4    Period  Weeks    Status  On-going        OT Long Term Goals - 01/28/19 1142      OT LONG TERM GOAL #1   Title  Pt independent with updated HEP prn - 03/19/19    Time  8    Period  Weeks    Status  New      OT LONG TERM GOAL #2   Title  Grip strength Lt hand to be 40 lbs or greater to open jars/containers    Time  8    Period  Weeks    Status  New      OT LONG TERM GOAL #3   Title  Pt to return to using LUE as dominant UE 80% of the time    Time  8    Period  Weeks    Status  New      OT LONG TERM GOAL #4   Title  Pt to perform IADL tasks (cooking/cleaning) for 30 min or greater w/o rests or LOB safely     Time  8    Period  Weeks    Status  New      OT LONG TERM GOAL #5   Title  Pt to type 25 wpm or greater with minimal errors in prep for work related tasks    Time  8    Period  Weeks    Status  New            Plan - 02/07/19 1228    Clinical Impression Statement  Pt gradually increasing use of Lt hand, but too early to tell if new medicine is  helping with neuropathic pain in Lt hand    Occupational performance deficits (Please refer to evaluation for details):  ADL's;IADL's;Work    Body Structure / Function / Physical Skills  ADL;UE functional use;FMC;Mobility;Sensation;Endurance;Pain;Strength;IADL;Coordination;Proprioception    Rehab Potential  Good    OT Frequency  2x / week    OT Duration  8 weeks    OT Treatment/Interventions  Self-care/ADL training;Therapeutic exercise;Coping strategies training;Moist Heat;Paraffin;Neuromuscular education;Patient/family education;Energy conservation;Fluidtherapy;Therapeutic activities;DME and/or AE instruction;Manual Therapy;Passive range of motion    Plan  fluido, grip strength, hand writing, practice braiding/stringing, coordination with arm up    Consulted and Agree with Plan of Care  Patient       Patient will benefit from skilled therapeutic intervention in order to improve the following deficits and impairments:  Body Structure / Function / Physical Skills  Visit Diagnosis: Other lack of coordination  Altered sensation due to acute stroke (HCC)  Muscle weakness (generalized)    Problem List Patient Active Problem List   Diagnosis Date Noted  . Leucocytosis 12/19/2018  . Right thalamic infarction (HCC) 12/19/2018  . Hot flashes 06/17/2015  . Preventative health care 06/17/2015  . Type 2 diabetes mellitus (HCC) 03/05/2015  . Hyperlipidemia 03/05/2015    Kelli ChurnBallie, Pecola Haxton Johnson, OTR/L 02/07/2019, 12:54 PM  [facility] [address] [place], [place], [address] Phone: [phone]   Fax:  [phone]  Name: Cherlyn CushingMichelle G Bradner MRN: [id number] Date of Birth: November 07, 1965

## 2019-02-08 ENCOUNTER — Other Ambulatory Visit: Payer: Self-pay | Admitting: Neurology

## 2019-02-08 DIAGNOSIS — I639 Cerebral infarction, unspecified: Secondary | ICD-10-CM

## 2019-02-08 DIAGNOSIS — I6381 Other cerebral infarction due to occlusion or stenosis of small artery: Secondary | ICD-10-CM

## 2019-02-12 ENCOUNTER — Encounter: Payer: Self-pay | Admitting: Adult Health

## 2019-02-12 ENCOUNTER — Telehealth: Payer: Self-pay | Admitting: Primary Care

## 2019-02-12 NOTE — Telephone Encounter (Signed)
[employer] faxed short term disability claim form On cart to be delivered by carrie

## 2019-02-12 NOTE — Telephone Encounter (Signed)
Completed and placed on Robins desk. 

## 2019-02-13 ENCOUNTER — Other Ambulatory Visit: Payer: Self-pay | Admitting: Adult Health

## 2019-02-13 MED ORDER — AMITRIPTYLINE HCL 10 MG PO TABS: 20.0000 mg | ORAL_TABLET | Freq: Every day | ORAL | 0 refills | Status: DC

## 2019-02-13 NOTE — Telephone Encounter (Signed)
Robin, FYI. 

## 2019-02-13 NOTE — Telephone Encounter (Signed)
Tried calling pt mailbox full Let pt know paperwork has been faxed Copy for pt Copy for scan

## 2019-02-13 NOTE — Telephone Encounter (Signed)
Date changed and forms placed on Robin's desk.

## 2019-02-13 NOTE — Telephone Encounter (Signed)
fmla paperwork in [place] box Do you want to change end date for 3 months per my chart message

## 2019-02-14 NOTE — Telephone Encounter (Signed)
Revised paperwork faxed 5/21

## 2019-02-14 NOTE — Telephone Encounter (Signed)
Pt aware paperwork has been refaxed  She wanted 4/6n office visit faxed also.  She gave me verbal permission to send note Faxed 02/14/2019

## 2019-02-15 ENCOUNTER — Other Ambulatory Visit: Payer: Self-pay

## 2019-02-15 ENCOUNTER — Ambulatory Visit: Payer: Managed Care, Other (non HMO) | Admitting: Rehabilitative and Restorative Service Providers"

## 2019-02-15 ENCOUNTER — Ambulatory Visit: Payer: Managed Care, Other (non HMO) | Admitting: Occupational Therapy

## 2019-02-15 DIAGNOSIS — R278 Other lack of coordination: Secondary | ICD-10-CM

## 2019-02-15 DIAGNOSIS — R448 Other symptoms and signs involving general sensations and perceptions: Secondary | ICD-10-CM

## 2019-02-15 DIAGNOSIS — I639 Cerebral infarction, unspecified: Secondary | ICD-10-CM

## 2019-02-15 DIAGNOSIS — M6281 Muscle weakness (generalized): Secondary | ICD-10-CM

## 2019-02-15 NOTE — Therapy (Signed)
[facility] [address] [place], [place], [address] Phone: [phone]   Fax:  [phone]  Patient Details  Name: SHAKEISHA SHADDOCK MRN: [id number] Date of Birth: 1966/09/13 Referring Provider:  Doreene Nest, NP  Encounter Date: 02/15/2019  Patient signed in for PT visit, but left after her OT appointment due to not feeling well/ reported to OT low blood sugar.   No visit performed.  Lakelyn Straus 02/15/2019, 12:28 PM  [facility] [address] [place], [place], [address] Phone: [phone]   Fax:  [phone]

## 2019-02-15 NOTE — Therapy (Signed)
[facility] [address] [place], [place], [address] Phone: [phone]   Fax:  [phone]  Occupational Therapy Treatment  Patient Details  Name: Haley Gomez MRN: [id number] Date of Birth: 12/01/1965 Referring Provider (OT): Vernona RiegerKatherine Clark, NP   Encounter Date: 02/15/2019  OT End of Session - 02/15/19 1145    Visit Number  5    Number of Visits  16    Date for OT Re-Evaluation  03/19/19    Authorization Type  [employer] - OT/PT 30 VISIT limit - if seen on same day counts as 1 visit    OT Start Time  1105    OT Stop Time  1150    OT Time Calculation (min)  45 min    Activity Tolerance  Patient tolerated treatment well    Behavior During Therapy  [facility] for tasks assessed/performed       Past Medical History:  Diagnosis Date  . Diabetes mellitus without complication (HCC)   . Hyperlipidemia   . Right thalamic infarction [facility](HCC)     Past Surgical History:  Procedure Laterality Date  . CESAREAN SECTION    . GALLBLADDER SURGERY  1997    There were no vitals filed for this visit.  Subjective Assessment - 02/15/19 1108    Pertinent History  CVA 12/19/18. PMH: DM    Currently in Pain?  Yes    Pain Score  4     Pain Location  Hand    Pain Orientation  Left    Pain Descriptors / Indicators  Burning;Tightness    Pain Type  Neuropathic pain    Pain Onset  More than a month ago    Pain Frequency  Constant    Aggravating Factors   increased activity, cold    Pain Relieving Factors  rest, heat          CLINIC OPERATION CHANGES: Outpatient [facility] Clinic is operating at a low capacity due to COVID-19.  The patient was brought into the clinic for evaluation and/or treatment following universal masking by staff, social distancing, and <10 people in the clinic.  The patient's COVID risk of complications score is 2.           OT Treatments/Exercises (OP) - 02/15/19 0001      Exercises    Exercises  Hand      Hand Exercises   Other Hand Exercises  Gripper set at level 1 resistance to pick up blocks Lt hand for sustained grip strength with min difficulty and drops, increased fatigue with reps    Other Hand Exercises  braiding string for fine motor control with difficulty initially due to decr. sensation, coordination, and possible mild motor apraxia, however improved with repetition      LUE Fluidotherapy   Number Minutes Fluidotherapy  10 Minutes    LUE Fluidotherapy Location  Hand    Comments  to decrease pain and for desensitization      Fine Motor Coordination (Hand/Wrist)   Fine Motor Coordination  Handwriting;Nuts and Bolts    Handwriting  Tracing the alphabet w/ highlighter for pre-writing ex and control/precision. Followed by free writing alphabet in print and in cursive    Nuts and Bolts  unscrewing/screwing nuts/bolts with Lt hand as dominant while arm at midrange - pt had trouble using Lt index finger (mostly affected finger with sensation) and required vision to assist               OT Short  Term Goals - 02/15/19 1145      OT SHORT TERM GOAL #1   Title  Independent with initial HEP - 02/16/19    Time  4    Period  Weeks    Status  Achieved      OT SHORT TERM GOAL #2   Title  Pt to improve coordination as evidenced by performing 9 hole peg test in 23 sec or less Lt hand    Time  4    Period  Weeks    Status  On-going      OT SHORT TERM GOAL #3   Title  Pt to improve grip strength Lt hand to 28 lbs or greater to assist with opening jars/containers    Time  4    Period  Weeks    Status  On-going      OT SHORT TERM GOAL #4   Title  Pt to verbalize understanding with desensitization techniques     Time  4    Period  Weeks    Status  Achieved      OT SHORT TERM GOAL #5   Title  Pt to demo writing full paragraph maintaining 90% or greater legibility in prep for work related tasks    Time  4    Period  Weeks    Status  On-going        OT  Long Term Goals - 01/28/19 1142      OT LONG TERM GOAL #1   Title  Pt independent with updated HEP prn - 03/19/19    Time  8    Period  Weeks    Status  New      OT LONG TERM GOAL #2   Title  Grip strength Lt hand to be 40 lbs or greater to open jars/containers    Time  8    Period  Weeks    Status  New      OT LONG TERM GOAL #3   Title  Pt to return to using LUE as dominant UE 80% of the time    Time  8    Period  Weeks    Status  New      OT LONG TERM GOAL #4   Title  Pt to perform IADL tasks (cooking/cleaning) for 30 min or greater w/o rests or LOB safely     Time  8    Period  Weeks    Status  New      OT LONG TERM GOAL #5   Title  Pt to type 25 wpm or greater with minimal errors in prep for work related tasks    Time  8    Period  Weeks    Status  New            Plan - 02/15/19 1145    Clinical Impression Statement  Pt gradually increasing use of Lt hand, but continues to experience tightness and burning sensation    Occupational performance deficits (Please refer to evaluation for details):  ADL's;IADL's;Work    Body Structure / Function / Physical Skills  ADL;UE functional use;FMC;Mobility;Sensation;Endurance;Pain;Strength;IADL;Coordination;Proprioception    Rehab Potential  Good    OT Frequency  2x / week    OT Duration  8 weeks    OT Treatment/Interventions  Self-care/ADL training;Therapeutic exercise;Coping strategies training;Moist Heat;Paraffin;Neuromuscular education;Patient/family education;Energy conservation;Fluidtherapy;Therapeutic activities;DME and/or AE instruction;Manual Therapy;Passive range of motion    Plan  fluido, assess STG's    Consulted and Agree  with Plan of Care  Patient       Patient will benefit from skilled therapeutic intervention in order to improve the following deficits and impairments:  Body Structure / Function / Physical Skills  Visit Diagnosis: Other lack of coordination  Altered sensation due to acute stroke  (HCC)  Muscle weakness (generalized)    Problem List Patient Active Problem List   Diagnosis Date Noted  . Leucocytosis 12/19/2018  . Right thalamic infarction (HCC) 12/19/2018  . Hot flashes 06/17/2015  . Preventative health care 06/17/2015  . Type 2 diabetes mellitus (HCC) 03/05/2015  . Hyperlipidemia 03/05/2015    Kelli Churn, OTR/L 02/15/2019, 11:47 AM  [facility] [address] [place], [place], [address] Phone: [phone]   Fax:  [phone]  Name: Haley Gomez MRN: [id number] Date of Birth: [birth year]

## 2019-02-19 ENCOUNTER — Ambulatory Visit: Payer: Managed Care, Other (non HMO) | Admitting: Physical Therapy

## 2019-02-19 ENCOUNTER — Encounter: Payer: Managed Care, Other (non HMO) | Admitting: Occupational Therapy

## 2019-02-20 ENCOUNTER — Telehealth: Payer: Self-pay | Admitting: Primary Care

## 2019-02-20 NOTE — Telephone Encounter (Signed)
Completed and placed on Robins desk. 

## 2019-02-20 NOTE — Telephone Encounter (Signed)
[employer] faxed fmla paper work On cart to be delivered by carrie

## 2019-02-22 NOTE — Telephone Encounter (Signed)
Paperwork faxed °

## 2019-02-26 ENCOUNTER — Ambulatory Visit: Payer: Managed Care, Other (non HMO) | Admitting: Occupational Therapy

## 2019-02-27 ENCOUNTER — Other Ambulatory Visit: Payer: Self-pay

## 2019-02-27 ENCOUNTER — Ambulatory Visit: Payer: Managed Care, Other (non HMO) | Attending: Primary Care | Admitting: Occupational Therapy

## 2019-02-27 DIAGNOSIS — R278 Other lack of coordination: Secondary | ICD-10-CM | POA: Diagnosis not present

## 2019-02-27 DIAGNOSIS — M6281 Muscle weakness (generalized): Secondary | ICD-10-CM | POA: Diagnosis present

## 2019-02-27 DIAGNOSIS — I639 Cerebral infarction, unspecified: Secondary | ICD-10-CM | POA: Insufficient documentation

## 2019-02-27 DIAGNOSIS — R448 Other symptoms and signs involving general sensations and perceptions: Secondary | ICD-10-CM

## 2019-02-27 DIAGNOSIS — R209 Unspecified disturbances of skin sensation: Secondary | ICD-10-CM | POA: Insufficient documentation

## 2019-02-27 NOTE — Therapy (Signed)
[facility] [address] [place], [place], [address] Phone: [phone]   Fax:  [phone]  Occupational Therapy Treatment  Patient Details  Name: Haley Gomez MRN: [id number] Date of Birth: [birth year] Referring Provider (OT): Vernona Rieger, NP   Encounter Date: 02/27/2019  OT End of Session - 02/27/19 1352    Visit Number  6    Number of Visits  16    Date for OT Re-Evaluation  03/19/19    Authorization Type  [employer] - OT/PT 30 VISIT limit - if seen on same day counts as 1 visit    OT Start Time  1300    OT Stop Time  1345    OT Time Calculation (min)  45 min    Activity Tolerance  Patient tolerated treatment well    Behavior During Therapy  [facility] for tasks assessed/performed       Past Medical History:  Diagnosis Date  . Diabetes mellitus without complication (HCC)   . Hyperlipidemia   . Right thalamic infarction [facility])     Past Surgical History:  Procedure Laterality Date  . CESAREAN SECTION    . GALLBLADDER SURGERY  1997    There were no vitals filed for this visit.  Subjective Assessment - 02/27/19 1301    Pertinent History  CVA 12/19/18. PMH: DM    Currently in Pain?  Yes    Pain Score  4     Pain Location  Hand    Pain Orientation  Left    Pain Descriptors / Indicators  Burning;Tightness    Pain Type  Neuropathic pain    Pain Onset  More than a month ago    Pain Frequency  Constant    Aggravating Factors   increased activity, cold    Pain Relieving Factors  rest, heat         [facility] OT Assessment - 02/27/19 0001      Written Expression   Handwriting  --   95% for full paragraph     Coordination   Left 9 Hole Peg Test  21.28 sec      Hand Function   Right Hand Grip (lbs)  51    Left Hand Grip (lbs)  41               OT Treatments/Exercises (OP) - 02/27/19 0001      ADLs   ADL Comments  Assessed STG's and progress to date      LUE Fluidotherapy   Number Minutes  Fluidotherapy  10 Minutes    LUE Fluidotherapy Location  Hand    Comments  decrease pain and for desensitization      Fine Motor Coordination (Hand/Wrist)   Fine Motor Coordination  Small Pegboard   practiced handwriting and typing games (clouds)   Small Pegboard  Placing [place]ner pegs and grooved pegs in pegboard with focus on using index finger and thumb               OT Short Term Goals - 02/27/19 1324      OT SHORT TERM GOAL #1   Title  Independent with initial HEP - 02/16/19    Time  4    Period  Weeks    Status  Achieved      OT SHORT TERM GOAL #2   Title  Pt to improve coordination as evidenced by performing 9 hole peg test in 23 sec or less Lt hand    Time  4    Period  Weeks    Status  Achieved   21.28 sec     OT SHORT TERM GOAL #3   Title  Pt to improve grip strength Lt hand to 28 lbs or greater to assist with opening jars/containers    Time  4    Period  Weeks    Status  Achieved   41 lbs     OT SHORT TERM GOAL #4   Title  Pt to verbalize understanding with desensitization techniques     Time  4    Period  Weeks    Status  Achieved      OT SHORT TERM GOAL #5   Title  Pt to demo writing full paragraph maintaining 90% or greater legibility in prep for work related tasks    Time  4    Period  Weeks    Status  Achieved   95%        OT Long Term Goals - 02/27/19 1329      OT LONG TERM GOAL #1   Title  Pt independent with updated HEP prn - 03/19/19    Time  8    Period  Weeks    Status  New      OT LONG TERM GOAL #2   Title  Grip strength Lt hand to be 45 lbs or greater to open jars/containers    Time  8    Period  Weeks    Status  Revised      OT LONG TERM GOAL #3   Title  Pt to return to using LUE as dominant UE 80% of the time    Time  8    Period  Weeks    Status  New      OT LONG TERM GOAL #4   Title  Pt to perform IADL tasks (cooking/cleaning) for 30 min or greater w/o rests or LOB safely     Time  8    Period  Weeks    Status   New      OT LONG TERM GOAL #5   Title  Pt to type 35 wpm or greater with minimal errors in prep for work related tasks    Baseline  32 wpm, 78% accuracy    Time  8    Period  Weeks    Status  Revised            Plan - 02/27/19 1352    Clinical Impression Statement  Pt making progress with grip strength, coordination, and typing. Pt still reluctant to use index finger (using LF instead) with pincer grasp.     Body Structure / Function / Physical Skills  ADL;UE functional use;FMC;Mobility;Sensation;Endurance;Pain;Strength;IADL;Coordination;Proprioception    Rehab Potential  Good    Comorbidities Affecting Occupational Performance:  None    OT Frequency  2x / week    OT Duration  8 weeks    OT Treatment/Interventions  Self-care/ADL training;Therapeutic exercise;Coping strategies training;Moist Heat;Paraffin;Neuromuscular education;Patient/family education;Energy conservation;Fluidtherapy;Therapeutic activities;DME and/or AE instruction;Manual Therapy;Passive range of motion    Plan  continue fluido, coordination, and grip strength    Consulted and Agree with Plan of Care  Patient       Patient will benefit from skilled therapeutic intervention in order to improve the following deficits and impairments:  Body Structure / Function / Physical Skills  Visit Diagnosis: Other lack of coordination  Altered sensation due to acute stroke [facility])    Problem List Patient  Active Problem List   Diagnosis Date Noted  . Leucocytosis 12/19/2018  . Right thalamic infarction (HCC) 12/19/2018  . Hot flashes 06/17/2015  . Preventative health care 06/17/2015  . Type 2 diabetes mellitus (HCC) 03/05/2015  . Hyperlipidemia 03/05/2015    Kelli ChurnBallie, Marchetta Navratil Johnson, OTR/L 02/27/2019, 4:21 PM  [facility] [address] [place], [place], [address] Phone: [phone]   Fax:  [phone]  Name: Haley Gomez MRN: [id number] Date of  Birth: 05/14/1966

## 2019-03-06 ENCOUNTER — Other Ambulatory Visit: Payer: Self-pay

## 2019-03-06 ENCOUNTER — Ambulatory Visit: Payer: Managed Care, Other (non HMO) | Admitting: Occupational Therapy

## 2019-03-06 DIAGNOSIS — M6281 Muscle weakness (generalized): Secondary | ICD-10-CM

## 2019-03-06 DIAGNOSIS — R278 Other lack of coordination: Secondary | ICD-10-CM

## 2019-03-06 DIAGNOSIS — R448 Other symptoms and signs involving general sensations and perceptions: Secondary | ICD-10-CM

## 2019-03-06 DIAGNOSIS — I639 Cerebral infarction, unspecified: Secondary | ICD-10-CM

## 2019-03-06 NOTE — Therapy (Signed)
[facility] [address] [place], [place], [address] Phone: [phone]   Fax:  [phone]  Occupational Therapy Treatment  Patient Details  Name: Haley Gomez MRN: [id number] Date of Birth: 09/17/1966 Referring Provider (OT): Alma Friendly, NP   Encounter Date: 03/06/2019  OT End of Session - 03/06/19 1338    Visit Number  7    Number of Visits  16    Date for OT Re-Evaluation  03/19/19    Authorization Type  [employer] - OT/PT 30 VISIT limit - if seen on same day counts as 1 visit    OT Start Time  1305    OT Stop Time  1345    OT Time Calculation (min)  40 min    Activity Tolerance  Patient tolerated treatment well    Behavior During Therapy  [facility] for tasks assessed/performed       Past Medical History:  Diagnosis Date  . Diabetes mellitus without complication ([place])   . Hyperlipidemia   . Right thalamic infarction [facility])     Past Surgical History:  Procedure Laterality Date  . CESAREAN SECTION    . GALLBLADDER SURGERY  1997    There were no vitals filed for this visit.  Subjective Assessment - 03/06/19 1309    Pertinent History  CVA 12/19/18. PMH: DM    Currently in Pain?  Yes    Pain Score  3     Pain Location  Hand    Pain Orientation  Left    Pain Descriptors / Indicators  Burning;Tightness    Pain Type  Neuropathic pain    Pain Onset  More than a month ago    Pain Frequency  Constant    Aggravating Factors   increased activity, cold    Pain Relieving Factors  rest, heat                 CLINIC OPERATION CHANGES: Outpatient Neuro Rehab is open at lower capacity following universal masking, social distancing, and patient screening.  The patient's COVID risk of complications score is 2. Pt shown shelf liner to assist in opening jars secondary to difficulty and increased pain Lt hand with this.    OT Treatments/Exercises (OP) - 03/06/19 0001      Hand Exercises   Other Hand  Exercises  2 pt pinch with graded clothespins (yellow to green resistance), and 3 pt pinch w/ blue to black resistance clothespin to place and remove clothespins from antenna. Pt required occasional cues to use index finger with 3 pt pinch    Other Hand Exercises  Pt placing grooved pegs in pegboard with tweezers to work on index finger control and 2 pt pinch strength w/ mod difficulty      LUE Fluidotherapy   Number Minutes Fluidotherapy  10 Minutes    LUE Fluidotherapy Location  Hand    Comments  to decr. pain and for desensitization at beginning of session               OT Short Term Goals - 02/27/19 1324      OT SHORT TERM GOAL #1   Title  Independent with initial HEP - 02/16/19    Time  4    Period  Weeks    Status  Achieved      OT SHORT TERM GOAL #2   Title  Pt to improve coordination as evidenced by performing 9 hole peg test in 23 sec or less Lt hand  Time  4    Period  Weeks    Status  Achieved   21.28 sec     OT SHORT TERM GOAL #3   Title  Pt to improve grip strength Lt hand to 28 lbs or greater to assist with opening jars/containers    Time  4    Period  Weeks    Status  Achieved   41 lbs     OT SHORT TERM GOAL #4   Title  Pt to verbalize understanding with desensitization techniques     Time  4    Period  Weeks    Status  Achieved      OT SHORT TERM GOAL #5   Title  Pt to demo writing full paragraph maintaining 90% or greater legibility in prep for work related tasks    Time  4    Period  Weeks    Status  Achieved   95%        OT Long Term Goals - 03/06/19 1339      OT LONG TERM GOAL #1   Title  Pt independent with updated HEP prn - 03/19/19    Time  8    Period  Weeks    Status  Deferred      OT LONG TERM GOAL #2   Title  Grip strength Lt hand to be 45 lbs or greater to open jars/containers    Time  8    Period  Weeks    Status  Revised      OT LONG TERM GOAL #3   Title  Pt to return to using LUE as dominant UE 80% of the time     Time  8    Period  Weeks    Status  Achieved      OT LONG TERM GOAL #4   Title  Pt to perform IADL tasks (cooking/cleaning) for 30 min or greater w/o rests or LOB safely     Time  8    Period  Weeks    Status  Partially Met   occasionally requires 1 break      OT LONG TERM GOAL #5   Title  Pt to type 35 wpm or greater with minimal errors in prep for work related tasks    Baseline  32 wpm, 78% accuracy    Time  8    Period  Weeks    Status  Revised            Plan - 03/06/19 1339    Clinical Impression Statement  Pt making progress towards goals and has met some LTG's at this time. Pt still reports altered sensation and increased sensitivity Lt index finger    Occupational performance deficits (Please refer to evaluation for details):  ADL's;IADL's;Work    Body Structure / Function / Physical Skills  ADL;UE functional use;FMC;Mobility;Sensation;Endurance;Pain;Strength;IADL;Coordination;Proprioception    OT Frequency  2x / week    OT Duration  8 weeks    OT Treatment/Interventions  Self-care/ADL training;Therapeutic exercise;Coping strategies training;Moist Heat;Paraffin;Neuromuscular education;Patient/family education;Energy conservation;Fluidtherapy;Therapeutic activities;DME and/or AE instruction;Manual Therapy;Passive range of motion    Plan  grip strength, typing, assess remaining LTG's and possible d/c next session    Consulted and Agree with Plan of Care  Patient       Patient will benefit from skilled therapeutic intervention in order to improve the following deficits and impairments:  Body Structure / Function / Physical Skills  Visit Diagnosis: Other lack of coordination  Altered sensation due to acute stroke [facility])  Muscle weakness (generalized)    Problem List Patient Active Problem List   Diagnosis Date Noted  . Leucocytosis 12/19/2018  . Right thalamic infarction ([place]) 12/19/2018  . Hot flashes 06/17/2015  . Preventative health care 06/17/2015  . Type  2 diabetes mellitus ([place]) 03/05/2015  . Hyperlipidemia 03/05/2015    Carey Bullocks, OTR/L 03/06/2019, 1:44 PM  [place] [address] [place], [place], [address] Phone: [phone]   Fax:  [phone]  Name: Haley Gomez MRN: [id number] Date of Birth: [birth year]

## 2019-03-07 ENCOUNTER — Ambulatory Visit: Payer: Managed Care, Other (non HMO) | Admitting: Rehabilitative and Restorative Service Providers"

## 2019-03-07 ENCOUNTER — Ambulatory Visit: Payer: Managed Care, Other (non HMO) | Admitting: Occupational Therapy

## 2019-03-13 ENCOUNTER — Ambulatory Visit: Payer: Managed Care, Other (non HMO) | Admitting: Occupational Therapy

## 2019-03-13 ENCOUNTER — Ambulatory Visit: Payer: Managed Care, Other (non HMO) | Admitting: Rehabilitative and Restorative Service Providers"

## 2019-03-13 NOTE — Telephone Encounter (Signed)
Copy for scan Copy for pt 

## 2019-03-14 ENCOUNTER — Ambulatory Visit: Payer: Managed Care, Other (non HMO) | Admitting: Occupational Therapy

## 2019-03-15 ENCOUNTER — Other Ambulatory Visit: Payer: Self-pay | Admitting: Adult Health

## 2019-03-18 ENCOUNTER — Ambulatory Visit: Payer: Managed Care, Other (non HMO) | Admitting: Occupational Therapy

## 2019-03-18 ENCOUNTER — Other Ambulatory Visit: Payer: Self-pay

## 2019-03-18 MED ORDER — AMITRIPTYLINE HCL 10 MG PO TABS: 20.0000 mg | ORAL_TABLET | Freq: Every day | ORAL | 0 refills | Status: DC

## 2019-03-19 ENCOUNTER — Ambulatory Visit: Payer: Managed Care, Other (non HMO) | Admitting: Occupational Therapy

## 2019-03-20 ENCOUNTER — Telehealth: Payer: Self-pay

## 2019-03-20 NOTE — Telephone Encounter (Signed)
If pt calls back please schedule her an appt with Janett Billow NP in July or August for follow up.   Rn tried to call pt to schedule 2 month follow up from May appt. Vm was full, could not leave message.

## 2019-03-27 ENCOUNTER — Other Ambulatory Visit: Payer: Self-pay

## 2019-03-27 ENCOUNTER — Telehealth (INDEPENDENT_AMBULATORY_CARE_PROVIDER_SITE_OTHER): Payer: Managed Care, Other (non HMO) | Admitting: Primary Care

## 2019-03-27 ENCOUNTER — Encounter: Payer: Self-pay | Admitting: Primary Care

## 2019-03-27 VITALS — Wt 158.0 lb

## 2019-03-27 DIAGNOSIS — E1159 Type 2 diabetes mellitus with other circulatory complications: Secondary | ICD-10-CM

## 2019-03-27 DIAGNOSIS — I639 Cerebral infarction, unspecified: Secondary | ICD-10-CM

## 2019-03-27 DIAGNOSIS — D72829 Elevated white blood cell count, unspecified: Secondary | ICD-10-CM | POA: Diagnosis not present

## 2019-03-27 DIAGNOSIS — R200 Anesthesia of skin: Secondary | ICD-10-CM | POA: Diagnosis not present

## 2019-03-27 DIAGNOSIS — E785 Hyperlipidemia, unspecified: Secondary | ICD-10-CM | POA: Diagnosis not present

## 2019-03-27 DIAGNOSIS — I6381 Other cerebral infarction due to occlusion or stenosis of small artery: Secondary | ICD-10-CM

## 2019-03-27 MED ORDER — INSULIN GLARGINE 100 UNIT/ML SOLOSTAR PEN: 12.0000 [IU] | PEN_INJECTOR | Freq: Every day | SUBCUTANEOUS | 3 refills | Status: DC

## 2019-03-27 NOTE — Assessment & Plan Note (Signed)
Unclear cause, repeat CBC pending. Consider hematology evaluation.

## 2019-03-27 NOTE — Progress Notes (Signed)
Subjective:    Patient ID: Haley Gomez, female    DOB: 01/04/1966, 53 y.o.   MRN: [id number]  HPI  Virtual Visit via Video Note  I connected with Haley Gomez on 03/27/19 at 10:20 AM EDT by a video enabled telemedicine application and verified that I am speaking with the correct person using two identifiers.  Location: Patient: Home  Provider: Office   I discussed the limitations of evaluation and management by telemedicine and the availability of in person appointments. The patient expressed understanding and agreed to proceed.  We attempted to connect via video, made the connection for a few minutes, and the video ultimately failed. We conducted most of our visit via phone which lasted 19 minutes and 3 seconds.  History of Present Illness:  Haley Gomez is a 53 year old female who presents today for follow up.  1) Type 2 Diabetes:   Current medications include: Glipizide ER 10 mg, Metformin 1000 mg BID. Lantus 12 units in the morning.  She is checking her blood glucose 1 times daily and is getting readings of  AM fasting: 131, 152, 118, 143, 95, 106, 109, 139  Last A1C: 12.5 in late March 2020, repeat A1C pending. Last Eye Exam: Completed in 2019 Last Foot Exam: Due next visit  Pneumonia Vaccination: Completed in 2016 ACE/ARB: None. Urine microalbumin test pending Statin: atorvastatin    2) Leukocytosis: Chronic and noted on several prior labs without symptoms of acute illness. She denies cough/cold symptoms, fevers, chills, etc.  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 Following with neurology who has her managed on amitriptyline 20 mg for nerve pain without much improvement. Previously managed on gabapentin which didn't help either. She has a follow up visit scheduled for later this Summer.   She denies any new weakness, dizziness, new numbness, chest pain. Her brain "fog" has since cleared up. 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 She completed physical therapy last week. She plans on contacting neurology given her continued nerve sensitivity.    Observations/Objective:  Alert and oriented. Appears well, not sickly. No distress. Speaking in complete sentences.   Assessment and Plan:  See problem based charting.  Follow Up Instructions:  Call the main line to schedule a lab only appointment.  Follow up with the neurologist as scheduled.  It was a pleasure to see you today! Haley Bossier, NP-C    I discussed the assessment and treatment plan with the patient. The patient was provided an opportunity to ask questions and all were answered. The patient agreed with the plan and demonstrated an understanding of the instructions.   The patient was advised to call back or seek an in-person evaluation if the symptoms worsen or if the condition fails to improve as anticipated.   Haley Koch, NP    Review of Systems  Constitutional: Negative for fever.  Eyes: Negative for visual disturbance.  Respiratory: Negative for shortness of breath.   Cardiovascular: Negative for chest pain.  Neurological:       See HPI   Past Medical History:  Diagnosis Date  . Diabetes mellitus without complication ([place])   . Hyperlipidemia   . Right thalamic infarction [facility])      Social History   Socioeconomic History  . Marital status: Divorced    Spouse name: Not on file  . Number of children: Not on file  . Years of education: Not on file  . Highest  education level: Not on file  Occupational History  . Not on file  Social Needs  . Financial resource strain: Not on file  . Food insecurity    Worry: Not on file    Inability: Not on file  . Transportation needs    Medical: Not on file    Non-medical: Not on file  Tobacco Use  . Smoking status: Never Smoker  . Smokeless tobacco: Never Used  Substance and Sexual Activity  . Alcohol use: Yes    Alcohol/week: 0.0 standard drinks     Comment: social  . Drug use: Never  . Sexual activity: Not on file  Lifestyle  . Physical activity    Days per week: Not on file    Minutes per session: Not on file  . Stress: Not on file  Relationships  . Social Herbalist on phone: Not on file    Gets together: Not on file    Attends religious service: Not on file    Active member of club or organization: Not on file    Attends meetings of clubs or organizations: Not on file    Relationship status: Not on file  . Intimate partner violence    Fear of current or ex partner: Not on file    Emotionally abused: Not on file    Physically abused: Not on file    Forced sexual activity: Not on file  Other Topics Concern  . Not on file  Social History Narrative   Single.   3 children.   Works as a Marine scientist at [employer]   Enjoys traveling, spending time with friends, exercising, reading.    Past Surgical History:  Procedure Laterality Date  . CESAREAN SECTION    . GALLBLADDER SURGERY  1997    Family History  Problem Relation Age of Onset  . Diabetes Mother   . Alcohol abuse Father   . Hypertension Father   . Kidney disease Father   . Stroke Father     Allergies  Allergen Reactions  . Bactrim [Sulfamethoxazole-Trimethoprim] Nausea Only    Current Outpatient Medications on File Prior to Visit  Medication Sig Dispense Refill  . aspirin EC 81 MG EC tablet Take 1 tablet (81 mg total) by mouth daily. 30 tablet 3  . atorvastatin (LIPITOR) 80 MG tablet Take 1 tablet (80 mg total) by mouth daily at 6 PM. 30 tablet 3  . blood glucose meter kit and supplies KIT Dispense based on patient and insurance preference. Use up to four times daily as directed. (FOR ICD-9 250.00, 250.01). 1 each 0  . diphenhydrAMINE (BENADRYL) 25 MG tablet Take 25 mg by mouth every 6 (six) hours as needed for sleep.    Marland Kitchen glipiZIDE (GLUCOTROL XL) 10 MG 24 hr tablet TAKE 1 TABLET BY MOUTH EVERY DAY WITH BREAKFAST   NEED OFFICE VISIT FOR FURTHER REFILLS  30 tablet 3  . Insulin Glargine (LANTUS) 100 UNIT/ML Solostar Pen Inject 10 Units into the skin daily. 15 mL 3  . Insulin Pen Needle 32G X 4 MM MISC Use with insulin pen 100 each 1  . metFORMIN (GLUCOPHAGE) 1000 MG tablet Take 1 tablet (1,000 mg total) by mouth 2 (two) times daily. Resume 12/24/2018 60 tablet 3  . amitriptyline (ELAVIL) 10 MG tablet Take 2 tablets (20 mg total) by mouth at bedtime. (Patient not taking: Reported on 03/27/2019) 180 tablet 0   No current facility-administered medications on file prior to visit.  Wt 158 lb (71.7 kg)   BMI 28.90 kg/m       Objective:   Physical Exam  Constitutional: She is oriented to person, place, and time. She appears well-nourished.  Respiratory: Effort normal. No respiratory distress.  Neurological: She is alert and oriented to person, place, and time.  Psychiatric: She has a normal mood and affect.           Assessment & Plan:

## 2019-03-27 NOTE — Assessment & Plan Note (Signed)
Compliant to atorvastatin, continue same.  °Repeat lipid panel pending. °

## 2019-03-27 NOTE — Assessment & Plan Note (Signed)
Recovering well overall except for nerve sensitivity. Failed treatment with gabapentin and amitriptyline. Encouraged her to contact her neurologist with an update.   Follow up due later this Summer.

## 2019-03-27 NOTE — Patient Instructions (Signed)
Call the main line to schedule a lab only appointment.  Follow up with the neurologist as scheduled.  It was a pleasure to see you today! Allie Bossier, NP-C

## 2019-03-27 NOTE — Assessment & Plan Note (Signed)
Glucose readings seem significantly improved based off of last visit. Compliant to diabetes regimen.  Managed on statin. Urine microalbumin pending. Pneumonia vaccination UTD.  Work on diet and exercise. Await A1C result.

## 2019-04-01 ENCOUNTER — Encounter: Payer: Self-pay | Admitting: Adult Health

## 2019-04-22 ENCOUNTER — Encounter: Payer: Self-pay | Admitting: Primary Care

## 2019-04-22 ENCOUNTER — Other Ambulatory Visit: Payer: Self-pay

## 2019-04-22 ENCOUNTER — Ambulatory Visit: Payer: Managed Care, Other (non HMO) | Admitting: Primary Care

## 2019-04-22 VITALS — BP 124/84 | HR 94 | Temp 98.7°F | Ht 62.0 in | Wt 160.5 lb

## 2019-04-22 DIAGNOSIS — R079 Chest pain, unspecified: Secondary | ICD-10-CM

## 2019-04-22 DIAGNOSIS — E1159 Type 2 diabetes mellitus with other circulatory complications: Secondary | ICD-10-CM

## 2019-04-22 DIAGNOSIS — K219 Gastro-esophageal reflux disease without esophagitis: Secondary | ICD-10-CM | POA: Insufficient documentation

## 2019-04-22 DIAGNOSIS — R200 Anesthesia of skin: Secondary | ICD-10-CM | POA: Diagnosis not present

## 2019-04-22 DIAGNOSIS — E785 Hyperlipidemia, unspecified: Secondary | ICD-10-CM | POA: Diagnosis not present

## 2019-04-22 DIAGNOSIS — D72829 Elevated white blood cell count, unspecified: Secondary | ICD-10-CM

## 2019-04-22 LAB — CBC
HCT: 40.6 % (ref 36.0–46.0)
Hemoglobin: 12.7 g/dL (ref 12.0–15.0)
MCHC: 31.3 g/dL (ref 30.0–36.0)
MCV: 80.3 fl (ref 78.0–100.0)
Platelets: 385 10*3/uL (ref 150.0–400.0)
RBC: 5.05 Mil/uL (ref 3.87–5.11)
RDW: 15.8 % — ABNORMAL HIGH (ref 11.5–15.5)
WBC: 11.4 10*3/uL — ABNORMAL HIGH (ref 4.0–10.5)

## 2019-04-22 LAB — HEMOGLOBIN A1C: Hgb A1c MFr Bld: 9.2 % — ABNORMAL HIGH (ref 4.6–6.5)

## 2019-04-22 LAB — VITAMIN B12: Vitamin B-12: 98 pg/mL — ABNORMAL LOW (ref 211–911)

## 2019-04-22 LAB — MICROALBUMIN / CREATININE URINE RATIO
Creatinine,U: 144.8 mg/dL
Microalb Creat Ratio: 1.3 mg/g (ref 0.0–30.0)
Microalb, Ur: 1.8 mg/dL (ref 0.0–1.9)

## 2019-04-22 LAB — LIPID PANEL
Cholesterol: 130 mg/dL (ref 0–200)
HDL: 32.4 mg/dL — ABNORMAL LOW (ref >39.00)
LDL Cholesterol: 81 mg/dL (ref 0–99)
NonHDL: 97.9
Total CHOL/HDL Ratio: 4
Triglycerides: 84 mg/dL (ref 0.0–149.0)
VLDL: 16.8 mg/dL (ref 0.0–40.0)

## 2019-04-22 NOTE — Patient Instructions (Signed)
Stop by the lab prior to leaving today. I will notify you of your results once received.   Continue pantoprazole 40 mg once every evening for heartburn.  Avoid laying flat within 2 hours of eating.  Take a look at triggers for heartburn listed below.   Food Choices for Gastroesophageal Reflux Disease, Adult When you have gastroesophageal reflux disease (GERD), the foods you eat and your eating habits are very important. Choosing the right foods can help ease your discomfort. Think about working with a nutrition specialist (dietitian) to help you make good choices. What are tips for following this plan?  Meals  Choose healthy foods that are low in fat, such as fruits, vegetables, whole grains, low-fat dairy products, and lean meat, fish, and poultry.  Eat small meals often instead of 3 large meals a day. Eat your meals slowly, and in a place where you are relaxed. Avoid bending over or lying down until 2-3 hours after eating.  Avoid eating meals 2-3 hours before bed.  Avoid drinking a lot of liquid with meals.  Cook foods using methods other than frying. Bake, grill, or broil food instead.  Avoid or limit: ? Chocolate. ? Peppermint or spearmint. ? Alcohol. ? Pepper. ? Black and decaffeinated coffee. ? Black and decaffeinated tea. ? Bubbly (carbonated) soft drinks. ? Caffeinated energy drinks and soft drinks.  Limit high-fat foods such as: ? Fatty meat or fried foods. ? Whole milk, cream, butter, or ice cream. ? Nuts and nut butters. ? Pastries, donuts, and sweets made with butter or shortening.  Avoid foods that cause symptoms. These foods may be different for everyone. Common foods that cause symptoms include: ? Tomatoes. ? Oranges, lemons, and limes. ? Peppers. ? Spicy food. ? Onions and garlic. ? Vinegar. Lifestyle  Maintain a healthy weight. Ask your doctor what weight is healthy for you. If you need to lose weight, work with your doctor to do so safely.   Exercise for at least 30 minutes for 5 or more days each week, or as told by your doctor.  Wear loose-fitting clothes.  Do not smoke. If you need help quitting, ask your doctor.  Sleep with the head of your bed higher than your feet. Use a wedge under the mattress or blocks under the bed frame to raise the head of the bed. Summary  When you have gastroesophageal reflux disease (GERD), food and lifestyle choices are very important in easing your symptoms.  Eat small meals often instead of 3 large meals a day. Eat your meals slowly, and in a place where you are relaxed.  Limit high-fat foods such as fatty meat or fried foods.  Avoid bending over or lying down until 2-3 hours after eating.  Avoid peppermint and spearmint, caffeine, alcohol, and chocolate. This information is not intended to replace advice given to you by your health care provider. Make sure you discuss any questions you have with your health care provider. Document Released: 03/13/2012 Document Revised: 01/03/2019 Document Reviewed: 10/18/2016 Elsevier Patient Education  2020 [employer].

## 2019-04-22 NOTE — Addendum Note (Signed)
Addended by: Ellamae Sia on: 04/22/2019 11:12 AM   Modules accepted: Orders

## 2019-04-22 NOTE — Progress Notes (Signed)
Subjective:    Patient ID: Haley Gomez, female    DOB: May 17, 1966, 53 y.o.   MRN: [id number]  HPI  Haley Gomez is a 53 year old female with a history of type 2 diabetes, hyperlipidemia, CVA who presents today with a chief complaint of chest pain.  BP Readings from Last 3 Encounters:  04/22/19 124/84  12/31/18 122/76  12/21/18 108/73   Chest pain has been intermittent for the last one week, mostly "bothers me at night". Her pain originates to the unilateral scapula (both sides), also with substernal/esophageal burning. The burning will only be noticeable when laying flat. She woke up in the middle of the night three nights ago with sharp left sided pain (under left breast) which lasted for a few seconds, fell back asleep within an hour. She did experience significant belching once last week, felt improved after belching.   She's been taking pantoprazole 40 mg daily over the last several days with improvement in esophageal burning. This was prescribed during her most recent hospital stay. She denies shortness of breath, abdominal, nausea, fatigue. Her last episode of scapular pain was two nights ago, did feel some esophageal burning yesterday morning and took pantoprazole in the morning and evening with resolve. She's had no pain today.   She is also overdue for labs which were ordered in early July 2020 after her video visit.  Review of Systems  Constitutional: Negative for fever.  Eyes: Negative for visual disturbance.  Respiratory: Negative for shortness of breath.   Cardiovascular: Positive for chest pain.  Gastrointestinal: Negative for abdominal pain, nausea and vomiting.       Esophageal burning, belching  Neurological: Negative for dizziness and numbness.       Past Medical History:  Diagnosis Date  . Diabetes mellitus without complication ([facility])   . Hyperlipidemia   . Right thalamic infarction [facility])      Social History   Socioeconomic History  . Marital status:  Divorced    Spouse name: Not on file  . Number of children: Not on file  . Years of education: Not on file  . Highest education level: Not on file  Occupational History  . Not on file  Social Needs  . Financial resource strain: Not on file  . Food insecurity    Worry: Not on file    Inability: Not on file  . Transportation needs    Medical: Not on file    Non-medical: Not on file  Tobacco Use  . Smoking status: Never Smoker  . Smokeless tobacco: Never Used  Substance and Sexual Activity  . Alcohol use: Yes    Alcohol/week: 0.0 standard drinks    Comment: social  . Drug use: Never  . Sexual activity: Not on file  Lifestyle  . Physical activity    Days per week: Not on file    Minutes per session: Not on file  . Stress: Not on file  Relationships  . Social Herbalist on phone: Not on file    Gets together: Not on file    Attends religious service: Not on file    Active member of club or organization: Not on file    Attends meetings of clubs or organizations: Not on file    Relationship status: Not on file  . Intimate partner violence    Fear of current or ex partner: Not on file    Emotionally abused: Not on file    Physically abused: Not  on file    Forced sexual activity: Not on file  Other Topics Concern  . Not on file  Social History Narrative   Single.   3 children.   Works as a Marine scientist at [employer]   Enjoys traveling, spending time with friends, exercising, reading.    Past Surgical History:  Procedure Laterality Date  . CESAREAN SECTION    . GALLBLADDER SURGERY  1997    Family History  Problem Relation Age of Onset  . Diabetes Mother   . Alcohol abuse Father   . Hypertension Father   . Kidney disease Father   . Stroke Father     Allergies  Allergen Reactions  . Bactrim [Sulfamethoxazole-Trimethoprim] Nausea Only    Current Outpatient Medications on File Prior to Visit  Medication Sig Dispense Refill  . aspirin EC 81 MG EC tablet Take 1  tablet (81 mg total) by mouth daily. 30 tablet 3  . atorvastatin (LIPITOR) 80 MG tablet Take 1 tablet (80 mg total) by mouth daily at 6 PM. 30 tablet 3  . blood glucose meter kit and supplies KIT Dispense based on patient and insurance preference. Use up to four times daily as directed. (FOR ICD-9 250.00, 250.01). 1 each 0  . diphenhydrAMINE (BENADRYL) 25 MG tablet Take 25 mg by mouth every 6 (six) hours as needed for sleep.    Marland Kitchen glipiZIDE (GLUCOTROL XL) 10 MG 24 hr tablet TAKE 1 TABLET BY MOUTH EVERY DAY WITH BREAKFAST   NEED OFFICE VISIT FOR FURTHER REFILLS 30 tablet 3  . Insulin Glargine (LANTUS) 100 UNIT/ML Solostar Pen Inject 12 Units into the skin daily. 15 mL 3  . Insulin Pen Needle 32G X 4 MM MISC Use with insulin pen 100 each 1  . metFORMIN (GLUCOPHAGE) 1000 MG tablet Take 1 tablet (1,000 mg total) by mouth 2 (two) times daily. Resume 12/24/2018 60 tablet 3  . pantoprazole (PROTONIX) 40 MG tablet Take 40 mg by mouth daily.    Marland Kitchen amitriptyline (ELAVIL) 10 MG tablet Take 2 tablets (20 mg total) by mouth at bedtime. (Patient not taking: Reported on 03/27/2019) 180 tablet 0   No current facility-administered medications on file prior to visit.     BP 124/84   Pulse 94   Temp 98.7 F (37.1 C) (Temporal)   Ht _0  (1.575 m)   Wt 160 lb 8 oz (72.8 kg)   SpO2 98%   BMI 29.36 kg/m    Objective:   Physical Exam  Constitutional: She appears well-nourished.  Neck: Neck supple.  Cardiovascular: Normal rate and regular rhythm.  Respiratory: Effort normal and breath sounds normal.  GI: Soft. Bowel sounds are normal. There is no abdominal tenderness.  Skin: Skin is warm and dry.  Psychiatric: She has a normal mood and affect.           Assessment & Plan:

## 2019-04-22 NOTE — Assessment & Plan Note (Addendum)
More suspicious for GERD, especially since pain has improved with oral pantoprazole.  Given history of diabetes and CVA we will check labs that are pending. Patient also requesting Troponin levels be added.   ECG today with NSR with rate of 98 (she is nervous today), no ST changes, t-wave inversion. This ECG appears grossly unchanged from ECG in March 2020.  Continue pantoprazole HS, await labs. Strict ED precautions provided.

## 2019-04-22 NOTE — Addendum Note (Signed)
Addended by: Ellamae Sia on: 04/22/2019 11:19 AM   Modules accepted: Orders

## 2019-04-22 NOTE — Assessment & Plan Note (Signed)
Symptoms today more suspicious for GERD, will have her continue pantoprazole 40 mg and work on avoiding triggers.

## 2019-04-23 ENCOUNTER — Other Ambulatory Visit: Payer: Self-pay | Admitting: Primary Care

## 2019-04-23 ENCOUNTER — Other Ambulatory Visit: Payer: Managed Care, Other (non HMO)

## 2019-04-23 DIAGNOSIS — E785 Hyperlipidemia, unspecified: Secondary | ICD-10-CM

## 2019-04-23 DIAGNOSIS — R079 Chest pain, unspecified: Secondary | ICD-10-CM

## 2019-04-23 DIAGNOSIS — E1159 Type 2 diabetes mellitus with other circulatory complications: Secondary | ICD-10-CM

## 2019-04-23 DIAGNOSIS — I639 Cerebral infarction, unspecified: Secondary | ICD-10-CM

## 2019-04-23 DIAGNOSIS — I6381 Other cerebral infarction due to occlusion or stenosis of small artery: Secondary | ICD-10-CM

## 2019-04-23 LAB — TROPONIN I (HIGH SENSITIVITY): High Sens Troponin I: <5 ng/L (ref 5–17)

## 2019-04-24 MED ORDER — INSULIN GLARGINE 100 UNIT/ML SOLOSTAR PEN: 15.0000 [IU] | PEN_INJECTOR | Freq: Every day | SUBCUTANEOUS | 3 refills | Status: DC

## 2019-04-24 MED ORDER — ATORVASTATIN CALCIUM 80 MG PO TABS: 80.0000 mg | ORAL_TABLET | Freq: Every day | ORAL | 3 refills | Status: DC

## 2019-04-24 MED ORDER — ASPIRIN 81 MG PO TBEC: 81.0000 mg | DELAYED_RELEASE_TABLET | Freq: Every day | ORAL | 3 refills | Status: DC

## 2019-04-24 NOTE — Telephone Encounter (Signed)
Did you see her refill request for test strips? I don't.

## 2019-04-25 NOTE — Telephone Encounter (Signed)
Yes, I have reorder on 10/24/2018. I have schedule patient for her first B12 but she soul schedule the follow up appointment.

## 2019-04-30 ENCOUNTER — Ambulatory Visit (INDEPENDENT_AMBULATORY_CARE_PROVIDER_SITE_OTHER): Payer: Managed Care, Other (non HMO)

## 2019-04-30 ENCOUNTER — Telehealth: Payer: Self-pay

## 2019-04-30 DIAGNOSIS — E538 Deficiency of other specified B group vitamins: Secondary | ICD-10-CM

## 2019-04-30 MED ORDER — CYANOCOBALAMIN 1000 MCG/ML IJ SOLN
1000.0000 ug | Freq: Once | INTRAMUSCULAR | Status: AC
  Administered 2019-04-30: 1000 ug via INTRAMUSCULAR

## 2019-04-30 MED ORDER — CYANOCOBALAMIN 1000 MCG/ML IJ SOLN: INTRAMUSCULAR | 0 refills | Status: DC

## 2019-04-30 NOTE — Telephone Encounter (Signed)
Message left for patient to return my call.  

## 2019-04-30 NOTE — Progress Notes (Signed)
Per orders of Alma Friendly, NP injection of B12-#1given by Kris Mouton. Patient tolerated injection well.

## 2019-04-30 NOTE — Telephone Encounter (Signed)
Noted. Prescription for IM B12 sent to pharmacy.  She is to inject once weekly for another 3 weeks, then once every other week for 4 weeks.  We will plan to have her inject monthly after that so have her call us once she has completed her every other week regimen we will send additional refills.  Needs office visit follow-up for 3 months for diabetes and B12 check.  Please schedule. Please also send appropriate syringes for B12 injection.

## 2019-04-30 NOTE — Addendum Note (Signed)
Addended by: Pleas Koch on: 04/30/2019 01:48 PM   Modules accepted: Orders

## 2019-04-30 NOTE — Telephone Encounter (Signed)
Patient received her 1st B12 injection. 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 She is requesting supplies for this to be sent to [facility] in [place]. She also needs to verify her directions/frequency of doing these injections.

## 2019-05-03 ENCOUNTER — Ambulatory Visit: Payer: Managed Care, Other (non HMO)

## 2019-05-06 NOTE — Telephone Encounter (Signed)
Message left for patient to return my call.  

## 2019-05-08 NOTE — Telephone Encounter (Signed)
Send a message through MyChart 

## 2019-05-15 ENCOUNTER — Other Ambulatory Visit: Payer: Self-pay | Admitting: Primary Care

## 2019-05-18 ENCOUNTER — Other Ambulatory Visit: Payer: Self-pay | Admitting: Primary Care

## 2019-05-18 DIAGNOSIS — E538 Deficiency of other specified B group vitamins: Secondary | ICD-10-CM

## 2019-06-03 ENCOUNTER — Other Ambulatory Visit: Payer: Self-pay | Admitting: Adult Health

## 2019-06-05 ENCOUNTER — Other Ambulatory Visit: Payer: Self-pay | Admitting: Family Medicine

## 2019-06-05 DIAGNOSIS — E1159 Type 2 diabetes mellitus with other circulatory complications: Secondary | ICD-10-CM

## 2019-06-05 MED ORDER — METFORMIN HCL 1000 MG PO TABS: 1000.0000 mg | ORAL_TABLET | Freq: Two times a day (BID) | ORAL | 3 refills | Status: DC

## 2019-06-05 MED ORDER — GLIPIZIDE ER 10 MG PO TB24: ORAL_TABLET | ORAL | 0 refills | Status: DC

## 2019-06-05 MED ORDER — PANTOPRAZOLE SODIUM 40 MG PO TBEC: 40.0000 mg | DELAYED_RELEASE_TABLET | Freq: Every day | ORAL | 1 refills | Status: DC

## 2019-06-18 ENCOUNTER — Other Ambulatory Visit: Payer: Self-pay | Admitting: Primary Care

## 2019-06-18 ENCOUNTER — Other Ambulatory Visit: Payer: Self-pay | Admitting: Family Medicine

## 2019-06-23 ENCOUNTER — Other Ambulatory Visit: Payer: Self-pay | Admitting: Adult Health

## 2019-07-23 NOTE — Therapy (Signed)
[facility] [address] [place], [place], [address] Phone: [phone]   Fax:  [phone]  Patient Details  Name: Haley Gomez MRN: [id number] Date of Birth: 1966-06-30 Referring Provider:  No ref. provider found  Encounter Date: 07/23/2019  OCCUPATIONAL THERAPY DISCHARGE SUMMARY  Visits from Start of Care: 7  Current functional level related to goals / functional outcomes: OT Short Term Goals - 02/27/19 1324      OT SHORT TERM GOAL #1   Title  Independent with initial HEP - 02/16/19    Time  4    Period  Weeks    Status  Achieved      OT SHORT TERM GOAL #2   Title  Pt to improve coordination as evidenced by performing 9 hole peg test in 23 sec or less Lt hand    Time  4    Period  Weeks    Status  Achieved   21.28 sec     OT SHORT TERM GOAL #3   Title  Pt to improve grip strength Lt hand to 28 lbs or greater to assist with opening jars/containers    Time  4    Period  Weeks    Status  Achieved   41 lbs     OT SHORT TERM GOAL #4   Title  Pt to verbalize understanding with desensitization techniques     Time  4    Period  Weeks    Status  Achieved      OT SHORT TERM GOAL #5   Title  Pt to demo writing full paragraph maintaining 90% or greater legibility in prep for work related tasks    Time  4    Period  Weeks    Status  Achieved   95%      OT Long Term Goals - 03/06/19 1339      OT LONG TERM GOAL #1   Title  Pt independent with updated HEP prn - 03/19/19    Time  8    Period  Weeks    Status  Deferred      OT LONG TERM GOAL #2   Title  Grip strength Lt hand to be 45 lbs or greater to open jars/containers    Time  8    Period  Weeks    Status  Revised      OT LONG TERM GOAL #3   Title  Pt to return to using LUE as dominant UE 80% of the time    Time  8    Period  Weeks    Status  Achieved      OT LONG TERM GOAL #4   Title  Pt to perform IADL tasks (cooking/cleaning) for 30 min  or greater w/o rests or LOB safely     Time  8    Period  Weeks    Status  Partially Met   occasionally requires 1 break      OT LONG TERM GOAL #5   Title  Pt to type 35 wpm or greater with minimal errors in prep for work related tasks    Baseline  32 wpm, 78% accuracy    Time  8    Period  Weeks    Status  Revised         Remaining deficits: Unknown   Education / Equipment: HEP, Desensitization techniques  Plan: Patient agrees to discharge.  Patient goals were partially met. Patient is  being discharged due to not returning since the last visit.  ?????        Carey Bullocks, OTR/L 07/23/2019, 11:39 AM  [facility] [address] [place], [place], [address] Phone: [phone]   Fax:  [phone]

## 2019-08-05 ENCOUNTER — Other Ambulatory Visit: Payer: Self-pay | Admitting: Primary Care

## 2019-08-05 DIAGNOSIS — E1159 Type 2 diabetes mellitus with other circulatory complications: Secondary | ICD-10-CM

## 2019-08-06 DIAGNOSIS — I639 Cerebral infarction, unspecified: Secondary | ICD-10-CM

## 2019-08-06 DIAGNOSIS — I6381 Other cerebral infarction due to occlusion or stenosis of small artery: Secondary | ICD-10-CM

## 2019-08-06 MED ORDER — AMITRIPTYLINE HCL 10 MG PO TABS: 20.0000 mg | ORAL_TABLET | Freq: Every day | ORAL | 0 refills | Status: DC

## 2019-08-28 ENCOUNTER — Other Ambulatory Visit: Payer: Self-pay | Admitting: Primary Care

## 2019-08-28 DIAGNOSIS — I6381 Other cerebral infarction due to occlusion or stenosis of small artery: Secondary | ICD-10-CM

## 2019-08-28 DIAGNOSIS — I639 Cerebral infarction, unspecified: Secondary | ICD-10-CM

## 2019-08-29 ENCOUNTER — Encounter: Payer: Self-pay | Admitting: Adult Health

## 2019-09-02 ENCOUNTER — Other Ambulatory Visit: Payer: Self-pay

## 2019-09-02 ENCOUNTER — Ambulatory Visit (INDEPENDENT_AMBULATORY_CARE_PROVIDER_SITE_OTHER): Payer: Managed Care, Other (non HMO) | Admitting: Primary Care

## 2019-09-02 DIAGNOSIS — M792 Neuralgia and neuritis, unspecified: Secondary | ICD-10-CM | POA: Insufficient documentation

## 2019-09-02 DIAGNOSIS — K219 Gastro-esophageal reflux disease without esophagitis: Secondary | ICD-10-CM

## 2019-09-02 DIAGNOSIS — D72829 Elevated white blood cell count, unspecified: Secondary | ICD-10-CM

## 2019-09-02 DIAGNOSIS — E1159 Type 2 diabetes mellitus with other circulatory complications: Secondary | ICD-10-CM

## 2019-09-02 DIAGNOSIS — E538 Deficiency of other specified B group vitamins: Secondary | ICD-10-CM

## 2019-09-02 MED ORDER — GLIPIZIDE ER 10 MG PO TB24: ORAL_TABLET | ORAL | 3 refills | Status: DC

## 2019-09-02 MED ORDER — FREESTYLE LIBRE 14 DAY SENSOR MISC: 1.0000 | Freq: Three times a day (TID) | 5 refills | Status: DC

## 2019-09-02 MED ORDER — FREESTYLE LIBRE 14 DAY READER DEVI: 1.0000 | Freq: Three times a day (TID) | 0 refills | Status: AC

## 2019-09-02 MED ORDER — METFORMIN HCL 1000 MG PO TABS: 1000.0000 mg | ORAL_TABLET | Freq: Two times a day (BID) | ORAL | 3 refills | Status: DC

## 2019-09-02 MED ORDER — AMITRIPTYLINE HCL 50 MG PO TABS: 50.0000 mg | ORAL_TABLET | Freq: Every day | ORAL | 0 refills | Status: DC

## 2019-09-02 NOTE — Progress Notes (Signed)
Subjective:    Patient ID: Haley Gomez, female    DOB: 10-24-1965, 53 y.o.   MRN: [id number]  HPI  Virtual Visit via Video Note  I connected with Haley Gomez on 09/02/19 at  2:40 PM EST by a video enabled telemedicine application and verified that I am speaking with the correct person using two identifiers.  Location: Patient: Home Provider: Office   I discussed the limitations of evaluation and management by telemedicine and the availability of in person appointments. The patient expressed understanding and agreed to proceed.  We established a video connection but the voice connection was poor. We conducted the majority of our visit via phone which lased 21 min and 24 sec.  History of Present Illness:  Haley Gomez is a 53 year old female with a history of CVA, type 2 diabetes, GERD, hyperlipidemia who presents today for follow up.  1) Type 2 Diabetes:  Current medications include: Metformin 1000 mg BID, Glipizide XL 10 mg, Lantus 15 units HS.  She is checking her blood glucose 1 times daily and is getting readings of:  AM fasting: high 90's-low 100's Bedtime: 160's-170's  Last A1C: 9.2 in July 2020 Last Eye Exam: No exam, due in January 2021 Last Foot Exam: Due Pneumonia Vaccination: Completed in 2016 ACE/ARB: None. Urine micro negative in July 2020 Statin: atorvastatin   2) Right Thalamic infarction: Acute occurring in March 7371 with complications of nerve sensitivity to left upper extremity, hand, face intermittently. Currently prescribed amitriptyline 10 mg and has been taking 20 mg over the last several months and has noticed a small difference, not enough of improvement and is wondering if this is her new normal. She is left handed and has frustration as she has to work and function with her left hand.   She underwent labs in July 2020, B12 level of 98, was prescribed B12 injections for which she completed one month total, is not taking oral B12. She has a  follow up visit scheduled with her neurologist for late December 2020.   Observations/Objective:  Alert and oriented. Appears well, not sickly. No distress. Speaking in complete sentences.   Assessment and Plan:  See problem based charting  Follow Up Instructions:  Call the office to schedule a lab appointment as discussed.  I sent refills to your pharmacy.  We've increased your dose of amitriptyline to 50 mg once nightly.  Follow up with neurology as scheduled.  It was a pleasure to see you today! Haley Bossier, NP-C    I discussed the assessment and treatment plan with the patient. The patient was provided an opportunity to ask questions and all were answered. The patient agreed with the plan and demonstrated an understanding of the instructions.   The patient was advised to call back or seek an in-person evaluation if the symptoms worsen or if the condition fails to improve as anticipated.    Haley Koch, NP  This visit occurred during the SARS-CoV-2 public health emergency.  Safety protocols were in place, including screening questions prior to the visit, additional usage of staff PPE, and extensive cleaning of exam room while observing appropriate contact time as indicated for disinfecting solutions.     Review of Systems  Eyes: Negative for visual disturbance.  Respiratory: Negative for shortness of breath.   Cardiovascular: Negative for chest pain.  Neurological: Positive for numbness. Negative for dizziness.       Nerve sensitivity        Past  Medical History:  Diagnosis Date  . Diabetes mellitus without complication ([place])   . Hyperlipidemia   . Right thalamic infarction [facility])      Social History   Socioeconomic History  . Marital status: Divorced    Spouse name: Not on file  . Number of children: Not on file  . Years of education: Not on file  . Highest education level: Not on file  Occupational History  . Not on file  Social Needs  .  Financial resource strain: Not on file  . Food insecurity    Worry: Not on file    Inability: Not on file  . Transportation needs    Medical: Not on file    Non-medical: Not on file  Tobacco Use  . Smoking status: Never Smoker  . Smokeless tobacco: Never Used  Substance and Sexual Activity  . Alcohol use: Yes    Alcohol/week: 0.0 standard drinks    Comment: social  . Drug use: Never  . Sexual activity: Not on file  Lifestyle  . Physical activity    Days per week: Not on file    Minutes per session: Not on file  . Stress: Not on file  Relationships  . Social Herbalist on phone: Not on file    Gets together: Not on file    Attends religious service: Not on file    Active member of club or organization: Not on file    Attends meetings of clubs or organizations: Not on file    Relationship status: Not on file  . Intimate partner violence    Fear of current or ex partner: Not on file    Emotionally abused: Not on file    Physically abused: Not on file    Forced sexual activity: Not on file  Other Topics Concern  . Not on file  Social History Narrative   Single.   3 children.   Works as a Marine scientist at [employer]   Enjoys traveling, spending time with friends, exercising, reading.    Past Surgical History:  Procedure Laterality Date  . CESAREAN SECTION    . GALLBLADDER SURGERY  1997    Family History  Problem Relation Age of Onset  . Diabetes Mother   . Alcohol abuse Father   . Hypertension Father   . Kidney disease Father   . Stroke Father     Allergies  Allergen Reactions  . Bactrim [Sulfamethoxazole-Trimethoprim] Nausea Only    Current Outpatient Medications on File Prior to Visit  Medication Sig Dispense Refill  . aspirin (ASPIRIN LOW DOSE) 81 MG EC tablet Take 1 tablet (81 mg total) by mouth daily. Swallow whole. 90 tablet 3  . atorvastatin (LIPITOR) 80 MG tablet Take 1 tablet (80 mg total) by mouth daily. For cholesterol. 90 tablet 3  . blood glucose  meter kit and supplies KIT Dispense based on patient and insurance preference. Use up to four times daily as directed. (FOR ICD-9 250.00, 250.01). 1 each 0  . diphenhydrAMINE (BENADRYL) 25 MG tablet Take 25 mg by mouth every 6 (six) hours as needed for sleep.    . Insulin Glargine (LANTUS) 100 UNIT/ML Solostar Pen Inject 15 Units into the skin daily. 15 mL 3  . Insulin Pen Needle (BD PEN NEEDLE NANO U/F) 32G X 4 MM MISC USE WITH INSULIN PEN AS DIRECTED 30 each 5  . ONETOUCH VERIO test strip USE 4 TIMES A DAY AS DIRECTED 100 strip 5  . pantoprazole (  PROTONIX) 40 MG tablet Take 1 tablet (40 mg total) by mouth daily. 30 tablet 1   No current facility-administered medications on file prior to visit.     There were no vitals taken for this visit.   Objective:   Physical Exam  Constitutional: She is oriented to person, place, and time. She appears well-nourished.  Respiratory: Effort normal.  Neurological: She is alert and oriented to person, place, and time.  Psychiatric: She has a normal mood and affect.           Assessment & Plan:

## 2019-09-02 NOTE — Assessment & Plan Note (Signed)
Persistent residual effect from CVA in March 2020. It does seem that amitriptyline has helped somewhat. Also checking B12 and will treat any deficiency to see if this helps.   Increase amitriptyline to 50 mg, she will update.

## 2019-09-02 NOTE — Assessment & Plan Note (Signed)
Repeat CBC pending. 

## 2019-09-02 NOTE — Assessment & Plan Note (Signed)
Level of 96 in July 2020, only took one month of treatment. Repeat B12 pending. She is open to restarting her injections at home. Await results.

## 2019-09-02 NOTE — Patient Instructions (Signed)
Call the office to schedule a lab appointment as discussed.  I sent refills to your pharmacy.  We've increased your dose of amitriptyline to 50 mg once nightly.  Follow up with neurology as scheduled.  It was a pleasure to see you today! Allie Bossier, NP-C

## 2019-09-02 NOTE — Assessment & Plan Note (Signed)
Using pantoprazole PRN and not daily, continue same.

## 2019-09-02 NOTE — Assessment & Plan Note (Signed)
Evening glucose readings seem above goal, AM fasting appear great. Compliant to her regimen.   Repeat A1C pending. Eye exam due in January 2021. Foot exam next visit. Urine micro negative in July 2020. Pneumonia vaccination UTD.  Continue current regimen for now.  Follow up in 3-6 months.

## 2019-09-13 ENCOUNTER — Other Ambulatory Visit: Payer: Self-pay | Admitting: Primary Care

## 2019-09-13 DIAGNOSIS — I639 Cerebral infarction, unspecified: Secondary | ICD-10-CM

## 2019-09-13 DIAGNOSIS — I6381 Other cerebral infarction due to occlusion or stenosis of small artery: Secondary | ICD-10-CM

## 2019-09-25 ENCOUNTER — Telehealth: Payer: Self-pay

## 2019-09-25 ENCOUNTER — Telehealth: Payer: Managed Care, Other (non HMO) | Admitting: Adult Health

## 2019-09-25 NOTE — Progress Notes (Deleted)
[facility] [address] [place]. [place] [address] [phone]       VIRTUAL VISIT FOLLOW UP NOTE  [name] Date of Birth:  1966/04/09 Medical Record Number:  [id number]   Reason for Referral:  hospital stroke follow up    Virtual Visit via Video Note  I connected with Haley Gomez on 09/25/19 at  7:45 AM EST by a video enabled telemedicine application located remotely in my own home and verified that I am speaking with the correct person using two identifiers who was located at their own home.   Visit scheduled by Katharine Look, RN. She discussed the limitations of evaluation and management by telemedicine and the availability of in person appointments. The patient expressed understanding and agreed to proceed.Please see telephone note for additional scheduling information and consent.    CHIEF COMPLAINT:  No chief complaint on file.   HPI: Stroke admission 12/19/2018: Ms. SHAKARA Gomez is a 53 y.o. female with history of DB and HTN  who presented with L face and arm numbness.  Stroke work-up revealed right thalamic infarct as evidenced on MRI secondary to small vessel disease source.  CTA showed slight proximal right P2 narrowing.  2D echo unremarkable.  LDL 199 -increase atorvastatin dose 20 mg to 80 mg daily and A1c 12.5 -recommended close PCP follow-up at discharge.  HTN stable.  Other stroke risk factors include EtOH use and family history of stroke (father) but no personal history of stroke.  Recommended DAPT for 3 weeks and aspirin alone.  Residual deficits decreased left-sided sensation and was discharged home in stable condition without therapy needs.  Initial visit via virtual visit 02/05/2019: She continues to have residual left-sided numbness and paresthesias.  As she is left-hand dominant, this is caused her to have difficulty performing activities such as writing or using a keyboard.  Due to this, she has not returned to work at  this time and is currently receiving short-term disability through her PCP.  PCP initiated gabapentin 100 mg 3 times daily without benefit.  She describes pain as a burning sensation and tightness which worsens with activity.  She continues to participate in PT/OT which she believes has been helping.  Mild sensation of daytime fatigue/mind fogginess throughout the day.  Completed 3 weeks DAPT and continues on aspirin alone without side effects of bleeding or bruising.  Continues on atorvastatin 80 mg without side effects of myalgias.  Continues to follow with PCP for HTN and DM management.  No further concerns at this time.  Denies new or worsening stroke/TIA symptoms.  Update via virtual visit 09/25/2019: [name] is a 53 year old female who is being seen today via virtual visit due to COVID-19 safety precautions for stroke follow-up.  Residual deficits left-sided paresthesias.  At prior visit, initiated amitriptyline 25 mg nightly which was recently increased by PCP to 50 mg nightly.  Continues on aspirin and atorvastatin for secondary stroke prevention without side effects.  Blood pressure ***.  Denies new or worsening stroke/TIA symptoms.      ROS:   14 system review of systems performed and negative with exception of numbness, tingling and pain  PMH:  Past Medical History:  Diagnosis Date  . Diabetes mellitus without complication ([place])   . Hyperlipidemia   . Right thalamic infarction [facility])     PSH:  Past Surgical History:  Procedure Laterality Date  . CESAREAN SECTION    . GALLBLADDER SURGERY  1997    Social History:  Social History   Socioeconomic History  . Marital status: Divorced    Spouse name: Not on file  . Number of children: Not on file  . Years of education: Not on file  . Highest education level: Not on file  Occupational History  . Not on file  Tobacco Use  . Smoking status: Never Smoker  . Smokeless tobacco: Never Used  Substance and Sexual Activity  .  Alcohol use: Yes    Alcohol/week: 0.0 standard drinks    Comment: social  . Drug use: Never  . Sexual activity: Not on file  Other Topics Concern  . Not on file  Social History Narrative   Single.   3 children.   Works as a Marine scientist at [employer]   Enjoys traveling, spending time with friends, exercising, reading.   Social Determinants of Health   Financial Resource Strain:   . Difficulty of Paying Living Expenses: Not on file  Food Insecurity:   . Worried About Charity fundraiser in the Last Year: Not on file  . Ran Out of Food in the Last Year: Not on file  Transportation Needs:   . Lack of Transportation (Medical): Not on file  . Lack of Transportation (Non-Medical): Not on file  Physical Activity:   . Days of Exercise per Week: Not on file  . Minutes of Exercise per Session: Not on file  Stress:   . Feeling of Stress : Not on file  Social Connections:   . Frequency of Communication with Friends and Family: Not on file  . Frequency of Social Gatherings with Friends and Family: Not on file  . Attends Religious Services: Not on file  . Active Member of Clubs or Organizations: Not on file  . Attends Archivist Meetings: Not on file  . Marital Status: Not on file  Intimate Partner Violence:   . Fear of Current or Ex-Partner: Not on file  . Emotionally Abused: Not on file  . Physically Abused: Not on file  . Sexually Abused: Not on file    Family History:  Family History  Problem Relation Age of Onset  . Diabetes Mother   . Alcohol abuse Father   . Hypertension Father   . Kidney disease Father   . Stroke Father     Medications:   Current Outpatient Medications on File Prior to Visit  Medication Sig Dispense Refill  . amitriptyline (ELAVIL) 50 MG tablet Take 1 tablet (50 mg total) by mouth at bedtime. For nerve pain 90 tablet 0  . aspirin (ASPIRIN LOW DOSE) 81 MG EC tablet Take 1 tablet (81 mg total) by mouth daily. Swallow whole. 90 tablet 3  . atorvastatin  (LIPITOR) 80 MG tablet Take 1 tablet (80 mg total) by mouth daily. For cholesterol. 90 tablet 3  . blood glucose meter kit and supplies KIT Dispense based on patient and insurance preference. Use up to four times daily as directed. (FOR ICD-9 250.00, 250.01). 1 each 0  . Continuous Blood Gluc Receiver (FREESTYLE LIBRE 14 DAY READER) DEVI 1 Device by Does not apply route 3 (three) times daily. 1 each 0  . Continuous Blood Gluc Sensor (FREESTYLE LIBRE 14 DAY SENSOR) MISC 1 Device by Does not apply route 3 (three) times daily. 2 each 5  . diphenhydrAMINE (BENADRYL) 25 MG tablet Take 25 mg by mouth every 6 (six) hours as needed for sleep.    Marland Kitchen glipiZIDE (GLUCOTROL XL) 10 MG 24 hr tablet TAKE 1 TABLET BY  MOUTH EVERY DAY WITH BREAKFAST for diabetes. 90 tablet 3  . Insulin Glargine (LANTUS) 100 UNIT/ML Solostar Pen Inject 15 Units into the skin daily. 15 mL 3  . Insulin Pen Needle (BD PEN NEEDLE NANO U/F) 32G X 4 MM MISC USE WITH INSULIN PEN AS DIRECTED 30 each 5  . metFORMIN (GLUCOPHAGE) 1000 MG tablet Take 1 tablet (1,000 mg total) by mouth 2 (two) times daily. For diabetes 180 tablet 3  . ONETOUCH VERIO test strip USE 4 TIMES A DAY AS DIRECTED 100 strip 5  . pantoprazole (PROTONIX) 40 MG tablet Take 1 tablet (40 mg total) by mouth daily. 30 tablet 1   No current facility-administered medications on file prior to visit.    Allergies:   Allergies  Allergen Reactions  . Bactrim [Sulfamethoxazole-Trimethoprim] Nausea Only     Physical Exam  General: well developed, well nourished, seated, in no evident distress Head: head normocephalic and atraumatic.     Neurologic Exam Mental Status: Awake and fully alert. Oriented to place and time. Recent and remote memory intact. Attention span, concentration and fund of knowledge appropriate. Mood and affect appropriate.  Cranial Nerves: Extraocular movements full without nystagmus. Visual fields full to confrontation. Hearing intact to voice. Facial  sensation intact. Face, tongue, palate moves normally and symmetrically.  Motor: No evidence of weakness per drift assessment Sensory.:  UTA but subjectively reports numbness/tingling/paresthesias of left upper and lower extremity Coordination: Rapid alternating movements normal in all extremities. Finger-to-nose and heel-to-shin performed accurately bilaterally. Gait and Station: Arises from chair without difficulty. Stance is normal. Gait demonstrates normal stride length and balance. Able to heel, toe and tandem walk without difficulty.  Reflexes: UTA   NIHSS  0-1 (possible decrease sensation) Modified Rankin  2   Diagnostic Data (Labs, Imaging, Testing)  CT ANGIO HEAD W OR WO CONTRAST CT ANGIO NECK W OR WO CONTRAST 12/20/2018 IMPRESSION: IMPRESSION: No extracranial or intracranial stenosis of significance. Slight narrowing of proximal P2 segment RIGHT PCA, appears similar to priors.  MR BRAIN WO CONTRAST 12/19/2018 1. Small acute infarct of the dorsolateral right thalamus, in close proximity to the posterior limb of the internal capsule, in keeping with the reported left-sided symptoms. 2. Otherwise normal MRI of the brain.  ECHOCARDIOGRAM 12/20/2018 IMPRESSIONS    1. The left ventricle has normal systolic function with an ejection fraction of 60-65%. The cavity size was normal. Left ventricular diastolic parameters were normal. No evidence of left ventricular regional wall motion abnormalities.  2. The right ventricle has normal systolic function. The cavity was normal. There is no increase in right ventricular wall thickness.  3. The aortic valve is tricuspid.  4. No intracardiac thrombi or masses were visualized.    ASSESSMENT: Haley Gomez is a 53 y.o. year old female here with right thalamic infarct on 12/19/2018 secondary to small vessel disease. Vascular risk factors include HTN, HLD and DM.  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    PLAN:  1. Right thalamic infarct: Continue aspirin 81 mg daily  and atorvastatin 80 mg for secondary stroke prevention. Maintain strict control of hypertension with blood pressure goal below 130/90, diabetes with hemoglobin A1c goal below 6.5% and cholesterol with LDL cholesterol (bad cholesterol) goal below 70 mg/dL.  I also advised the patient to eat a healthy diet with plenty of whole grains, cereals, fruits and vegetables, exercise regularly with at least 30 minutes of continuous activity daily and maintain ideal body  weight. 2. Left paresthesias: Initiate amitriptyline 10 mg nightly and advised to call after 1 week for possible need of increasing dose.  Continue gabapentin and recommended taking 300 mg daily dose at bedtime to help with daytime drowsiness/mental fogging.  Recommend to hold off on returning to work until improvement of left hand use and ability to tolerate activity.  Discussion regarding thalamic pain syndrome and difficulty with treatment and will likely need to increase amitriptyline further if tolerating well or change regimen if needed.  Currently, short-term disability in effect until the end of this month.  Unable to determine at this time if extension is needed as this will depend on potential benefit of amitriptyline.  Once cleared to return to work, it would be recommended to return on a part-time basis to limit overexertion and activity of LUE 3. HTN: Advised to continue current treatment regimen.  Advised to continue to monitor at home along with continued follow-up with PCP for management 4. HLD: Advised to continue current treatment regimen along with continued follow-up with PCP for future prescribing and monitoring of lipid panel 5. DMII: Advised to continue to monitor glucose levels at home along with continued follow-up with PCP for management and monitoring 6. Declines sleep apnea symptoms such as snoring, daytime fatigue or insomnia -no  indication on sleep referral at this time    Follow up in 2 months or call earlier if needed   Greater than 50% of time during this 45 minute non-face-to-face visit was spent on counseling, explanation of diagnosis of right thalamic infarct, long discussion and education regarding thalamic pain syndrome and current treatment strategies, reviewing risk factor management of HTN, HLD and DM, planning of further management along with potential future management, and answering all questions to patient satisfaction    Frann Rider, AGNP-BC  [facility] [address] [place], [place] [address]  Phone [phone] Fax [phone] Note: This document was prepared with digital dictation and possible smart phrase technology. Any transcriptional errors that result from this process are unintentional.

## 2019-09-25 NOTE — Telephone Encounter (Signed)
Patient was a no show for their virtual visit today.  

## 2019-11-27 ENCOUNTER — Other Ambulatory Visit: Payer: Self-pay | Admitting: Primary Care

## 2019-11-27 DIAGNOSIS — M792 Neuralgia and neuritis, unspecified: Secondary | ICD-10-CM

## 2019-11-28 NOTE — Telephone Encounter (Signed)
How is she doing on the 50 mg dose of amitriptyline since we increased the dose. Also find out why she didn't see neurology.

## 2019-11-28 NOTE — Telephone Encounter (Signed)
Last prescribed on 09/02/2019. Last appointment on 09/02/2019. No future appointment. Looks like she no show neurology in December

## 2019-11-29 NOTE — Telephone Encounter (Signed)
Tried to call patient but got hung up on. Tried again later and busy signal.

## 2019-12-09 NOTE — Telephone Encounter (Signed)
Per DPR, patient does not want [place] to leave a message. Had to leave one due to patient is not calling back.

## 2019-12-10 ENCOUNTER — Other Ambulatory Visit: Payer: Self-pay | Admitting: Primary Care

## 2019-12-10 DIAGNOSIS — E1159 Type 2 diabetes mellitus with other circulatory complications: Secondary | ICD-10-CM

## 2019-12-11 MED ORDER — AMITRIPTYLINE HCL 50 MG PO TABS: 50.0000 mg | ORAL_TABLET | Freq: Every day | ORAL | 0 refills | Status: DC

## 2019-12-11 NOTE — Addendum Note (Signed)
Addended by: Tawnya Crook on: 12/11/2019 08:36 AM   Modules accepted: Orders

## 2019-12-17 MED ORDER — AMITRIPTYLINE HCL 50 MG PO TABS: 50.0000 mg | ORAL_TABLET | Freq: Every day | ORAL | 0 refills | Status: DC

## 2019-12-17 NOTE — Telephone Encounter (Signed)
Noted, refill sent to pharmacy. 

## 2019-12-17 NOTE — Addendum Note (Signed)
Addended by: Doreene Nest on: 12/17/2019 01:12 PM   Modules accepted: Orders

## 2020-01-09 ENCOUNTER — Ambulatory Visit: Payer: Self-pay | Attending: Internal Medicine

## 2020-01-09 ENCOUNTER — Other Ambulatory Visit: Payer: Self-pay | Admitting: Primary Care

## 2020-01-09 DIAGNOSIS — Z20822 Contact with and (suspected) exposure to covid-19: Secondary | ICD-10-CM | POA: Insufficient documentation

## 2020-01-09 DIAGNOSIS — E1159 Type 2 diabetes mellitus with other circulatory complications: Secondary | ICD-10-CM

## 2020-01-10 LAB — SARS-COV-2, NAA 2 DAY TAT

## 2020-01-10 LAB — NOVEL CORONAVIRUS, NAA: SARS-CoV-2, NAA: NOT DETECTED

## 2020-02-02 ENCOUNTER — Other Ambulatory Visit: Payer: Self-pay | Admitting: Adult Health

## 2020-02-02 DIAGNOSIS — I639 Cerebral infarction, unspecified: Secondary | ICD-10-CM

## 2020-02-02 DIAGNOSIS — I6381 Other cerebral infarction due to occlusion or stenosis of small artery: Secondary | ICD-10-CM

## 2020-02-26 ENCOUNTER — Other Ambulatory Visit: Payer: Self-pay

## 2020-02-26 ENCOUNTER — Ambulatory Visit (INDEPENDENT_AMBULATORY_CARE_PROVIDER_SITE_OTHER): Payer: Self-pay | Admitting: Primary Care

## 2020-02-26 ENCOUNTER — Encounter: Payer: Self-pay | Admitting: Primary Care

## 2020-02-26 VITALS — BP 110/66 | HR 100 | Temp 96.8°F | Ht 62.0 in | Wt 164.8 lb

## 2020-02-26 DIAGNOSIS — M792 Neuralgia and neuritis, unspecified: Secondary | ICD-10-CM

## 2020-02-26 DIAGNOSIS — J302 Other seasonal allergic rhinitis: Secondary | ICD-10-CM | POA: Insufficient documentation

## 2020-02-26 MED ORDER — GABAPENTIN 300 MG PO CAPS: 300.0000 mg | ORAL_CAPSULE | Freq: Every day | ORAL | 0 refills | Status: DC

## 2020-02-26 MED ORDER — FLUTICASONE PROPIONATE 50 MCG/ACT NA SUSP: 1.0000 | Freq: Two times a day (BID) | NASAL | 0 refills | Status: DC

## 2020-02-26 NOTE — Assessment & Plan Note (Signed)
Refill provided for gabapentin for which she takes HS with her amitriptyline.

## 2020-02-26 NOTE — Progress Notes (Signed)
Subjective:    Patient ID: Haley Gomez, female    DOB: Sep 20, 1966, 54 y.o.   MRN: [id number]  HPI  This visit occurred during the SARS-CoV-2 public health emergency.  Safety protocols were in place, including screening questions prior to the visit, additional usage of staff PPE, and extensive cleaning of exam room while observing appropriate contact time as indicated for disinfecting solutions.   Haley Gomez is a 54 year old female with a history of type 2 diabetes, right thalamic infarction, hyperlipidemia, nerve pain who presents today with a chief complaint of otalgia. She is also requesting a refill of her gabapentin.  Her pain is located to the right ear which has been intermittently bothersome for the last year. She had someone look at her right ear in the past and was told that she had "fluid in the ear". Over the last week she's noticed more constant achy pain without improvement. She's not taken anything OTC for her symptoms.  She denies cough, rhinorrhea, sinus pressure, fevers.   She is wanting a refill of her gabapentin as she's finding it more effective when taking along with the amitriptyline in regards to reducing her hypersensitivity and nerve pain. She is taking both medications at night.   Review of Systems  Constitutional: Negative for fever.  HENT: Positive for ear pain. Negative for facial swelling, postnasal drip, rhinorrhea and sore throat.        Ear fullness   Respiratory: Negative for cough.   Allergic/Immunologic: Positive for environmental allergies.       Past Medical History:  Diagnosis Date  . Diabetes mellitus without complication ([place])   . Hyperlipidemia   . Right thalamic infarction [facility])      Social History   Socioeconomic History  . Marital status: Divorced    Spouse name: Not on file  . Number of children: Not on file  . Years of education: Not on file  . Highest education level: Not on file  Occupational History  . Not on file    Tobacco Use  . Smoking status: Never Smoker  . Smokeless tobacco: Never Used  Substance and Sexual Activity  . Alcohol use: Yes    Alcohol/week: 0.0 standard drinks    Comment: social  . Drug use: Never  . Sexual activity: Not on file  Other Topics Concern  . Not on file  Social History Narrative   Single.   3 children.   Works as a Marine scientist at [employer]   Enjoys traveling, spending time with friends, exercising, reading.   Social Determinants of Health   Financial Resource Strain:   . Difficulty of Paying Living Expenses:   Food Insecurity:   . Worried About Charity fundraiser in the Last Year:   . Arboriculturist in the Last Year:   Transportation Needs:   . Film/video editor (Medical):   Marland Kitchen Lack of Transportation (Non-Medical):   Physical Activity:   . Days of Exercise per Week:   . Minutes of Exercise per Session:   Stress:   . Feeling of Stress :   Social Connections:   . Frequency of Communication with Friends and Family:   . Frequency of Social Gatherings with Friends and Family:   . Attends Religious Services:   . Active Member of Clubs or Organizations:   . Attends Archivist Meetings:   Marland Kitchen Marital Status:   Intimate Partner Violence:   . Fear of Current or Ex-Partner:   .  Emotionally Abused:   Marland Kitchen Physically Abused:   . Sexually Abused:     Past Surgical History:  Procedure Laterality Date  . CESAREAN SECTION    . GALLBLADDER SURGERY  1997    Family History  Problem Relation Age of Onset  . Diabetes Mother   . Alcohol abuse Father   . Hypertension Father   . Kidney disease Father   . Stroke Father     Allergies  Allergen Reactions  . Bactrim [Sulfamethoxazole-Trimethoprim] Nausea Only    Current Outpatient Medications on File Prior to Visit  Medication Sig Dispense Refill  . amitriptyline (ELAVIL) 50 MG tablet Take 1 tablet (50 mg total) by mouth at bedtime. For nerve pain 90 tablet 0  . aspirin (ASPIRIN LOW DOSE) 81 MG EC tablet  Take 1 tablet (81 mg total) by mouth daily. Swallow whole. 90 tablet 3  . atorvastatin (LIPITOR) 80 MG tablet Take 1 tablet (80 mg total) by mouth daily. For cholesterol. 90 tablet 3  . BD PEN NEEDLE NANO 2ND GEN 32G X 4 MM MISC USE WITH INSULIN PEN AS DIRECTED 30 each 5  . blood glucose meter kit and supplies KIT Dispense based on patient and insurance preference. Use up to four times daily as directed. (FOR ICD-9 250.00, 250.01). 1 each 0  . Continuous Blood Gluc Receiver (FREESTYLE LIBRE 14 DAY READER) DEVI 1 Device by Does not apply route 3 (three) times daily. 1 each 0  . Continuous Blood Gluc Sensor (FREESTYLE LIBRE 14 DAY SENSOR) MISC 1 Device by Does not apply route 3 (three) times daily. 2 each 5  . diphenhydrAMINE (BENADRYL) 25 MG tablet Take 25 mg by mouth every 6 (six) hours as needed for sleep.    Marland Kitchen glipiZIDE (GLUCOTROL XL) 10 MG 24 hr tablet TAKE 1 TABLET BY MOUTH EVERY DAY WITH BREAKFAST for diabetes. 90 tablet 3  . LANTUS SOLOSTAR 100 UNIT/ML Solostar Pen INJECT 15 UNITS INTO THE SKIN DAILY. 15 mL 0  . metFORMIN (GLUCOPHAGE) 1000 MG tablet Take 1 tablet (1,000 mg total) by mouth 2 (two) times daily. For diabetes 180 tablet 3  . ONETOUCH VERIO test strip USE 4 TIMES A DAY AS DIRECTED 100 strip 5   No current facility-administered medications on file prior to visit.    BP 110/66   Pulse 100   Temp (!) 96.8 F (36 C) (Temporal)   Ht '5\' 2"'  (1.575 m)   Wt 164 lb 12 oz (74.7 kg)   SpO2 98%   BMI 30.13 kg/m    Objective:   Physical Exam  Constitutional: She appears well-nourished.  HENT:  Mild fluid accumulation noted to right TM. TM's bilaterally clear without erythema. BLM noted bilaterally.   Cardiovascular: Normal rate and regular rhythm.  Respiratory: Effort normal and breath sounds normal.  Musculoskeletal:     Cervical back: Neck supple.  Skin: Skin is warm and dry.           Assessment & Plan:

## 2020-02-26 NOTE — Assessment & Plan Note (Signed)
Mild effusion noted to TM, no infection. Trial of Flonase sent to pharmacy.  Discussed addition of antihistamine.  She will update.

## 2020-02-26 NOTE — Patient Instructions (Addendum)
Nasal Congestion/Ear Pressure/Sinus Pressure: Try using Flonase (fluticasone) nasal spray. Instill 1 spray in each nostril twice daily.   You can add in Zyrtec, Claritin, or Allegra if no improvement.  It was a pleasure to see you today!

## 2020-04-02 ENCOUNTER — Other Ambulatory Visit: Payer: Self-pay | Admitting: Primary Care

## 2020-04-02 DIAGNOSIS — E785 Hyperlipidemia, unspecified: Secondary | ICD-10-CM

## 2020-04-08 ENCOUNTER — Other Ambulatory Visit: Payer: Self-pay | Admitting: Primary Care

## 2020-04-08 DIAGNOSIS — I639 Cerebral infarction, unspecified: Secondary | ICD-10-CM

## 2020-04-08 DIAGNOSIS — I6381 Other cerebral infarction due to occlusion or stenosis of small artery: Secondary | ICD-10-CM

## 2020-04-08 NOTE — Telephone Encounter (Signed)
Last prescribed on 04/24/2019 Last OV (acute/follow up ) with Haley Gomez on 02/26/2020 No future OV scheduled

## 2020-04-08 NOTE — Telephone Encounter (Signed)
Refills sent to pharmacy. 

## 2020-04-21 ENCOUNTER — Other Ambulatory Visit: Payer: Self-pay | Admitting: Primary Care

## 2020-04-21 DIAGNOSIS — M792 Neuralgia and neuritis, unspecified: Secondary | ICD-10-CM

## 2020-04-21 NOTE — Telephone Encounter (Signed)
Last prescribed on 12/17/2019 Last OV (acute ) with Haley Gomez on 02/26/2020 No future OV scheduled

## 2020-04-22 NOTE — Telephone Encounter (Signed)
Refills sent to pharmacy. 

## 2020-04-30 ENCOUNTER — Other Ambulatory Visit: Payer: Self-pay | Admitting: Primary Care

## 2020-04-30 DIAGNOSIS — E785 Hyperlipidemia, unspecified: Secondary | ICD-10-CM

## 2020-05-30 ENCOUNTER — Other Ambulatory Visit: Payer: Self-pay | Admitting: Primary Care

## 2020-05-30 DIAGNOSIS — E785 Hyperlipidemia, unspecified: Secondary | ICD-10-CM

## 2020-06-09 ENCOUNTER — Other Ambulatory Visit: Payer: Self-pay | Admitting: Primary Care

## 2020-06-26 ENCOUNTER — Other Ambulatory Visit: Payer: Self-pay | Admitting: Primary Care

## 2020-06-26 DIAGNOSIS — E1159 Type 2 diabetes mellitus with other circulatory complications: Secondary | ICD-10-CM

## 2020-07-04 ENCOUNTER — Other Ambulatory Visit: Payer: Self-pay | Admitting: Primary Care

## 2020-07-04 DIAGNOSIS — E785 Hyperlipidemia, unspecified: Secondary | ICD-10-CM

## 2020-07-21 ENCOUNTER — Other Ambulatory Visit: Payer: Self-pay | Admitting: Primary Care

## 2020-07-21 DIAGNOSIS — E785 Hyperlipidemia, unspecified: Secondary | ICD-10-CM

## 2020-07-23 NOTE — Telephone Encounter (Signed)
Pt returned your call She stated she started a new job and insurance does not take effect for 90 days.  She scheduled her appointment for 10/26/20.  She wanted to know if you would fill her rx until she can get in here

## 2020-07-24 ENCOUNTER — Other Ambulatory Visit: Payer: Self-pay

## 2020-07-24 DIAGNOSIS — M792 Neuralgia and neuritis, unspecified: Secondary | ICD-10-CM

## 2020-07-24 DIAGNOSIS — E1159 Type 2 diabetes mellitus with other circulatory complications: Secondary | ICD-10-CM

## 2020-07-24 DIAGNOSIS — E785 Hyperlipidemia, unspecified: Secondary | ICD-10-CM

## 2020-07-24 MED ORDER — GABAPENTIN 300 MG PO CAPS: 300.0000 mg | ORAL_CAPSULE | Freq: Every day | ORAL | 0 refills | Status: DC

## 2020-07-24 MED ORDER — LANTUS SOLOSTAR 100 UNIT/ML ~~LOC~~ SOPN: PEN_INJECTOR | SUBCUTANEOUS | 1 refills | Status: DC

## 2020-07-24 MED ORDER — ATORVASTATIN CALCIUM 80 MG PO TABS: ORAL_TABLET | ORAL | 0 refills | Status: DC

## 2020-07-24 NOTE — Telephone Encounter (Signed)
Called in refills. Due to dpr not able to leave detailed message. Message has been left to have patient call office to let her know.

## 2020-08-22 ENCOUNTER — Other Ambulatory Visit: Payer: Self-pay | Admitting: Primary Care

## 2020-08-22 DIAGNOSIS — E1159 Type 2 diabetes mellitus with other circulatory complications: Secondary | ICD-10-CM

## 2020-09-21 ENCOUNTER — Other Ambulatory Visit: Payer: Self-pay | Admitting: Primary Care

## 2020-09-21 DIAGNOSIS — E1159 Type 2 diabetes mellitus with other circulatory complications: Secondary | ICD-10-CM

## 2020-10-05 ENCOUNTER — Other Ambulatory Visit: Payer: Self-pay | Admitting: Primary Care

## 2020-10-05 DIAGNOSIS — E1159 Type 2 diabetes mellitus with other circulatory complications: Secondary | ICD-10-CM

## 2020-10-19 ENCOUNTER — Other Ambulatory Visit: Payer: Self-pay | Admitting: Primary Care

## 2020-10-19 DIAGNOSIS — M792 Neuralgia and neuritis, unspecified: Secondary | ICD-10-CM

## 2020-10-20 ENCOUNTER — Encounter: Payer: Self-pay | Admitting: Family Medicine

## 2020-10-20 ENCOUNTER — Other Ambulatory Visit: Payer: 59

## 2020-10-20 ENCOUNTER — Other Ambulatory Visit (INDEPENDENT_AMBULATORY_CARE_PROVIDER_SITE_OTHER): Payer: 59 | Admitting: Family Medicine

## 2020-10-20 ENCOUNTER — Telehealth (INDEPENDENT_AMBULATORY_CARE_PROVIDER_SITE_OTHER): Payer: 59 | Admitting: Family Medicine

## 2020-10-20 ENCOUNTER — Telehealth: Payer: Self-pay

## 2020-10-20 VITALS — Temp 98.6°F

## 2020-10-20 DIAGNOSIS — J029 Acute pharyngitis, unspecified: Secondary | ICD-10-CM

## 2020-10-20 LAB — POCT RAPID STREP A (OFFICE): Rapid Strep A Screen: NEGATIVE

## 2020-10-20 NOTE — Telephone Encounter (Signed)
[facility] Day - Client TELEPHONE ADVICE RECORD [facility] Patient Name: Haley Gomez Gender: Female DOB: November 24, 1965 Age: 55 Y 5 M 7 D Return Phone Number: [phone] (Primary) Address: City/State/Zip: [place] [address] Client [facility] Day - Client Client Site [facility] - Day Physician Vernona Rieger - NP Contact Type Call Who Is Calling Patient / Member / Family / Caregiver Call Type Triage / Clinical Relationship To Patient Self Return Phone Number [phone] (Primary) Chief Complaint Sore Throat Reason for Call Symptomatic / Request for Health Information Initial Comment Denishia at Mayra Reel, NP transferring caller who has sore throat and temp 101.2. Could this be Strep Throat? Caller tested positive for Covid19 in December 2021. Translation No Nurse Assessment Nurse: Reinaldo Berber, RN, Adriana Date/Time (Eastern Time): 10/19/2020 5:23:04 PM Confirm and document reason for call. If symptomatic, describe symptoms. ---Caller reports severe sore throat pain and fever 101.55F began today. Requesting strep swab. Does the patient have any new or worsening symptoms? ---Yes Will a triage be completed? ---Yes Related visit to physician within the last 2 weeks? ---No Does the PT have any chronic conditions? (i.e. diabetes, asthma, this includes High risk factors for pregnancy, etc.) ---Yes List chronic conditions. ---DM, HYPERLIPIDS Is the patient pregnant or possibly pregnant? (Ask all females between the ages of 53-55) ---No Is this a behavioral health or substance abuse call? ---No Guidelines Guideline Title Affirmed Question Affirmed Notes Nurse Date/Time (Eastern Time) COVID-19 - Diagnosed or Suspected [1] COVID-19 infection suspected by caller or triager AND [2] mild symptoms (cough, fever, or others) AND [3] has not gotten tested yet Reinaldo Berber, Charity fundraiser, Ricki Rodriguez 10/19/2020 5:25:06 PM Disp.  Time Lamount Cohen Time) Disposition Final User 10/19/2020 5:28:18 PM Call PCP when Office is Open Yes Cisneros, RN, Adriana PLEASE NOTE: All timestamps contained within this report are represented as [place] Standard Time. CONFIDENTIALTY NOTICE: This fax transmission is intended only for the addressee. It contains information that is legally privileged, confidential or otherwise protected from use or disclosure. If you are not the intended recipient, you are strictly prohibited from reviewing, disclosing, copying using or disseminating any of this information or taking any action in reliance on or regarding this information. If you have received this fax in error, please notify us immediately by telephone so that we can arrange for its return to [place]. Phone: [phone], Toll-Free: [phone], Fax: [phone] Page: 2 of 2 Call Id: [id number] Caller Disagree/Comply Comply Caller Understands Yes PreDisposition Call Doctor Care Advice Given Per Guideline CALL PCP WHEN OFFICE IS OPEN: COUGHING SPELLS: * Drink warm fluids. Inhale warm mist. This can help relax the airway and also loosen up phlegm. PAIN AND FEVER MEDICINES: * For pain or fever relief, take either acetaminophen or ibuprofen. CALL BACK IF: * You become worse CARE ADVICE given per COVID-19 - DIAGNOSED OR SUSPECTED (Adult) guideline. * Many clinics, retail clinics (such as [facility], [facility]), and urgent care centers perform testing.

## 2020-10-20 NOTE — Progress Notes (Signed)
I connected with Haley Gomez on 10/20/20 at  2:00 PM EST by video and verified that I am speaking with the correct person using two identifiers.   I discussed the limitations, risks, security and privacy concerns of performing an evaluation and management service by video and the availability of in person appointments. I also discussed with the patient that there may be a patient responsible charge related to this service. The patient expressed understanding and agreed to proceed.  Patient location: Home Provider Location: [facility] Participants: Lynnda Child and Haley Gomez   Subjective:     Haley Gomez is a 55 y.o. female presenting for Sore Throat and Fever (X 1 day )     Sore Throat  This is a new problem. Episode onset: 10/19/2020. The maximum temperature recorded prior to her arrival was 101 - 101.9 F. The pain is severe. Associated symptoms include swollen glands. Pertinent negatives include no congestion, coughing, diarrhea, ear pain, shortness of breath, trouble swallowing or vomiting. She has tried acetaminophen and NSAIDs for the symptoms. The treatment provided mild relief.  Fever  Associated symptoms include a sore throat. Pertinent negatives include no congestion, coughing, diarrhea, ear pain, nausea or vomiting.   Had covid in December 2021 Granddaughter had a sore throat on Saturday - she was watching them this weekend They resolved   Review of Systems  Constitutional: Positive for fever. Negative for chills.  HENT: Positive for postnasal drip and sore throat. Negative for congestion, ear pain, sinus pressure, sinus pain and trouble swallowing.   Respiratory: Negative for cough and shortness of breath.   Gastrointestinal: Negative for diarrhea, nausea and vomiting.  Musculoskeletal: Negative for arthralgias and myalgias.     Social History   Tobacco Use  Smoking Status Never Smoker  Smokeless Tobacco Never Used         Objective:   BP Readings from Last 3 Encounters:  02/26/20 110/66  04/22/19 124/84  12/31/18 122/76   Wt Readings from Last 3 Encounters:  02/26/20 164 lb 12 oz (74.7 kg)  04/22/19 160 lb 8 oz (72.8 kg)  03/27/19 158 lb (71.7 kg)   Temp 98.6 F (37 C) (Oral)    Physical Exam Constitutional:      Appearance: Normal appearance. She is not ill-appearing.  HENT:     Head: Normocephalic and atraumatic.     Right Ear: External ear normal.     Left Ear: External ear normal.     Mouth/Throat:     Mouth: Mucous membranes are moist.     Pharynx: Posterior oropharyngeal erythema present. No pharyngeal swelling.  Eyes:     Conjunctiva/sclera: Conjunctivae normal.  Pulmonary:     Effort: Pulmonary effort is normal. No respiratory distress.  Neurological:     Mental Status: She is alert. Mental status is at baseline.  Psychiatric:        Mood and Affect: Mood normal.        Behavior: Behavior normal.        Thought Content: Thought content normal.        Judgment: Judgment normal.              Assessment & Plan:   Problem List Items Addressed This Visit   None   Visit Diagnoses    Sore throat    -  Primary     Recently recovered from covid Suspect strept throat vs other virus. Offered flu testing but she would plan  symptomatic care so will focus on testing for strept.   Treat if positive.   Interactive audio and video telecommunications were attempted between this provider and patient, however failed, due to patient having technical difficulties OR patient did not have access to video capability.  We continued and completed visit with audio only.   I spent a total of 9 minutes in communication. Virtual failed due to lack of audio. Was able to see patient briefly   No follow-ups on file.  Lynnda Child, MD

## 2020-10-20 NOTE — Telephone Encounter (Signed)
See note from today

## 2020-10-20 NOTE — Telephone Encounter (Signed)
Pt already has my chart video visit appt on 10/20/20 at 2 pm with DR Selena Batten.

## 2020-10-21 ENCOUNTER — Encounter: Payer: Self-pay | Admitting: Family Medicine

## 2020-10-21 DIAGNOSIS — J019 Acute sinusitis, unspecified: Secondary | ICD-10-CM

## 2020-10-21 MED ORDER — AMOXICILLIN-POT CLAVULANATE 875-125 MG PO TABS: 1.0000 | ORAL_TABLET | Freq: Two times a day (BID) | ORAL | 0 refills | Status: AC

## 2020-10-23 ENCOUNTER — Encounter: Payer: Self-pay | Admitting: Family Medicine

## 2020-10-26 ENCOUNTER — Other Ambulatory Visit: Payer: Self-pay

## 2020-10-26 ENCOUNTER — Ambulatory Visit (INDEPENDENT_AMBULATORY_CARE_PROVIDER_SITE_OTHER): Payer: BC Managed Care – PPO | Admitting: Primary Care

## 2020-10-26 ENCOUNTER — Encounter: Payer: Self-pay | Admitting: Primary Care

## 2020-10-26 VITALS — BP 124/78 | HR 96 | Temp 98.3°F | Ht 62.0 in | Wt 166.2 lb

## 2020-10-26 DIAGNOSIS — M792 Neuralgia and neuritis, unspecified: Secondary | ICD-10-CM | POA: Diagnosis not present

## 2020-10-26 DIAGNOSIS — E785 Hyperlipidemia, unspecified: Secondary | ICD-10-CM

## 2020-10-26 DIAGNOSIS — Z8673 Personal history of transient ischemic attack (TIA), and cerebral infarction without residual deficits: Secondary | ICD-10-CM

## 2020-10-26 DIAGNOSIS — E1159 Type 2 diabetes mellitus with other circulatory complications: Secondary | ICD-10-CM

## 2020-10-26 LAB — COMPREHENSIVE METABOLIC PANEL
ALT: 14 U/L (ref 0–35)
AST: 14 U/L (ref 0–37)
Albumin: 4 g/dL (ref 3.5–5.2)
Alkaline Phosphatase: 137 U/L — ABNORMAL HIGH (ref 39–117)
BUN: 14 mg/dL (ref 6–23)
CO2: 30 mEq/L (ref 19–32)
Calcium: 10 mg/dL (ref 8.4–10.5)
Chloride: 100 mEq/L (ref 96–112)
Creatinine, Ser: 0.44 mg/dL (ref 0.40–1.20)
GFR: 110.4 mL/min (ref >60.00)
Glucose, Bld: 185 mg/dL — ABNORMAL HIGH (ref 70–99)
Potassium: 4 mEq/L (ref 3.5–5.1)
Sodium: 138 mEq/L (ref 135–145)
Total Bilirubin: 0.2 mg/dL (ref 0.2–1.2)
Total Protein: 7.4 g/dL (ref 6.0–8.3)

## 2020-10-26 LAB — CBC WITH DIFFERENTIAL/PLATELET
Basophils Absolute: 0.1 10*3/uL (ref 0.0–0.1)
Basophils Relative: 0.5 % (ref 0.0–3.0)
Eosinophils Absolute: 0.2 10*3/uL (ref 0.0–0.7)
Eosinophils Relative: 1.5 % (ref 0.0–5.0)
HCT: 36.8 % (ref 36.0–46.0)
Hemoglobin: 11.6 g/dL — ABNORMAL LOW (ref 12.0–15.0)
Lymphocytes Relative: 25.2 % (ref 12.0–46.0)
Lymphs Abs: 2.9 10*3/uL (ref 0.7–4.0)
MCHC: 31.5 g/dL (ref 30.0–36.0)
MCV: 75.2 fl — ABNORMAL LOW (ref 78.0–100.0)
Monocytes Absolute: 0.5 10*3/uL (ref 0.1–1.0)
Monocytes Relative: 4.7 % (ref 3.0–12.0)
Neutro Abs: 7.9 10*3/uL — ABNORMAL HIGH (ref 1.4–7.7)
Neutrophils Relative %: 68.1 % (ref 43.0–77.0)
Platelets: 379 10*3/uL (ref 150.0–400.0)
RBC: 4.9 Mil/uL (ref 3.87–5.11)
RDW: 18.1 % — ABNORMAL HIGH (ref 11.5–15.5)
WBC: 11.5 10*3/uL — ABNORMAL HIGH (ref 4.0–10.5)

## 2020-10-26 LAB — MICROALBUMIN / CREATININE URINE RATIO
Creatinine,U: 99 mg/dL
Microalb Creat Ratio: 0.9 mg/g (ref 0.0–30.0)
Microalb, Ur: 0.9 mg/dL (ref 0.0–1.9)

## 2020-10-26 LAB — LIPID PANEL
Cholesterol: 97 mg/dL (ref 0–200)
HDL: 31.3 mg/dL — ABNORMAL LOW (ref >39.00)
LDL Cholesterol: 43 mg/dL (ref 0–99)
NonHDL: 66.17
Total CHOL/HDL Ratio: 3
Triglycerides: 115 mg/dL (ref 0.0–149.0)
VLDL: 23 mg/dL (ref 0.0–40.0)

## 2020-10-26 LAB — HEMOGLOBIN A1C: Hgb A1c MFr Bld: 12.3 % — ABNORMAL HIGH (ref 4.6–6.5)

## 2020-10-26 MED ORDER — AMITRIPTYLINE HCL 50 MG PO TABS: 50.0000 mg | ORAL_TABLET | Freq: Every day | ORAL | 3 refills | Status: DC

## 2020-10-26 MED ORDER — GABAPENTIN 300 MG PO CAPS: 300.0000 mg | ORAL_CAPSULE | Freq: Every day | ORAL | 3 refills | Status: DC

## 2020-10-26 NOTE — Patient Instructions (Addendum)
Please schedule your eye exam.   Stop by the lab prior to leaving today. I will notify you of your results once received.   Start checking your blood sugar levels.  Appropriate times to check your blood sugar levels are:  -Before any meal (breakfast, lunch, dinner) -Two hours after any meal (breakfast, lunch, dinner) -Bedtime  Please schedule a follow up appointment in 6 months for complete physical.   It was a pleasure to see you today!

## 2020-10-26 NOTE — Assessment & Plan Note (Signed)
Compliant to atorvastatin 80 mg and aspirin 81 mg, continue same.   Repeat lipid panel pending.

## 2020-10-26 NOTE — Progress Notes (Signed)
Subjective:    Patient ID: Haley Gomez, female    DOB: 05-Sep-1966, 55 y.o.   MRN: [id number]  HPI  This visit occurred during the SARS-CoV-2 public health emergency.  Safety protocols were in place, including screening questions prior to the visit, additional usage of staff PPE, and extensive cleaning of exam room while observing appropriate contact time as indicated for disinfecting solutions.   [name] is a 55 year old female with a history of GERD, type 2 diabetes, CVA, vitamin B12 deficiency who presents today for follow up and medication refills.  1) Type 2 Diabetes:   Current medications include: Metformin 1000 mg BID, Glipizide XL 10 mg daily, Lantus 15 units.  She was checking her blood glucose numerous times daily with her FreeStyle Libre. She stopped checking in October 2021 due to lack of insurance.   Last A1C: 9.2 in July 2020 Last Eye Exam: Due, she will schedule Last Foot Exam: Due Pneumonia Vaccination: 2016 ACE/ARB: None. Urine micro due.  Statin: atorvastatin 80 mg  2) History of CVA/Chronic nerve pain: CVA with right thalamic infarction in early 2020.  She denies new symptoms of stroke including dizziness, blurred vision, weakness. She is still doing well on gabapentin 300 mg HS and amitriptyline 50 mg HS.   Review of Systems  Eyes: Negative for visual disturbance.  Respiratory: Negative for shortness of breath.   Cardiovascular: Negative for chest pain.  Neurological: Positive for numbness. Negative for dizziness and headaches.       Chronic nerve pain since CVA       Past Medical History:  Diagnosis Date  . Diabetes mellitus without complication ([place])   . Hyperlipidemia   . Right thalamic infarction [facility])      Social History   Socioeconomic History  . Marital status: Divorced    Spouse name: Not on file  . Number of children: Not on file  . Years of education: Not on file  . Highest education level: Not on file  Occupational History  .  Not on file  Tobacco Use  . Smoking status: Never Smoker  . Smokeless tobacco: Never Used  Vaping Use  . Vaping Use: Never used  Substance and Sexual Activity  . Alcohol use: Yes    Alcohol/week: 0.0 standard drinks    Comment: social  . Drug use: Never  . Sexual activity: Not on file  Other Topics Concern  . Not on file  Social History Narrative   Single.   3 children.   Works as a Marine scientist at [employer]   Enjoys traveling, spending time with friends, exercising, reading.   Social Determinants of Health   Financial Resource Strain: Not on file  Food Insecurity: Not on file  Transportation Needs: Not on file  Physical Activity: Not on file  Stress: Not on file  Social Connections: Not on file  Intimate Partner Violence: Not on file    Past Surgical History:  Procedure Laterality Date  . CESAREAN SECTION    . GALLBLADDER SURGERY  1997    Family History  Problem Relation Age of Onset  . Diabetes Mother   . Alcohol abuse Father   . Hypertension Father   . Kidney disease Father   . Stroke Father     Allergies  Allergen Reactions  . Bactrim [Sulfamethoxazole-Trimethoprim] Nausea Only    Current Outpatient Medications on File Prior to Visit  Medication Sig Dispense Refill  . amoxicillin-clavulanate (AUGMENTIN) 875-125 MG tablet Take 1 tablet by mouth  2 (two) times daily for 7 days. 14 tablet 0  . ASPIRIN LOW DOSE 81 MG EC tablet TAKE 1 TABLET (81 MG TOTAL) BY MOUTH DAILY. SWALLOW WHOLE. 90 tablet 3  . atorvastatin (LIPITOR) 80 MG tablet TAKE 1 TABLET BY MOUTH EVERY DAY FOR CHOLESTEROL 90 tablet 0  . BD PEN NEEDLE NANO 2ND GEN 32G X 4 MM MISC USE WITH INSULIN PEN AS DIRECTED 30 each 5  . blood glucose meter kit and supplies KIT Dispense based on patient and insurance preference. Use up to four times daily as directed. (FOR ICD-9 250.00, 250.01). 1 each 0  . Continuous Blood Gluc Receiver (FREESTYLE LIBRE 14 DAY READER) DEVI 1 Device by Does not apply route 3 (three) times  daily. 1 each 0  . Continuous Blood Gluc Sensor (FREESTYLE LIBRE 14 DAY SENSOR) MISC USE 3 (THREE) TIMES DAILY AS DIRECTED. CHANGE EVERY 14 DAYS 2 each 28  . diphenhydrAMINE (BENADRYL) 25 MG tablet Take 25 mg by mouth every 6 (six) hours as needed for sleep.    . fluticasone (FLONASE) 50 MCG/ACT nasal spray Place 1 spray into both nostrils 2 (two) times daily. 16 g 0  . glipiZIDE (GLUCOTROL XL) 10 MG 24 hr tablet TAKE 1 TABLET BY MOUTH EVERY DAY WITH BREAKFAST FOR DIABETES. 90 tablet 3  . insulin glargine (LANTUS SOLOSTAR) 100 UNIT/ML Solostar Pen INJECT 15 UNITS INTO THE SKIN DAILY. 15 mL 1  . metFORMIN (GLUCOPHAGE) 1000 MG tablet TAKE 1 TABLET (1,000 MG TOTAL) BY MOUTH 2 (TWO) TIMES DAILY. FOR DIABETES 180 tablet 0  . ONETOUCH VERIO test strip USE 4 TIMES A DAY AS DIRECTED 100 strip 5   No current facility-administered medications on file prior to visit.    BP 124/78   Pulse 96   Temp 98.3 F (36.8 C) (Oral)   Ht '5\' 2"'  (1.575 m)   Wt 166 lb 3.2 oz (75.4 kg)   SpO2 100%   BMI 30.40 kg/m    Objective:   Physical Exam Constitutional:      Appearance: She is well-nourished.  Cardiovascular:     Rate and Rhythm: Normal rate and regular rhythm.  Pulmonary:     Effort: Pulmonary effort is normal.     Breath sounds: Normal breath sounds.  Musculoskeletal:     Cervical back: Neck supple.  Skin:    General: Skin is warm and dry.  Psychiatric:        Mood and Affect: Mood and affect normal.            Assessment & Plan:

## 2020-10-26 NOTE — Assessment & Plan Note (Addendum)
No recent follow up, has also not been checking glucose readings despite insulin use.   Discussed the absolute need to start checking glucose readings, discussed correct times for checks.  Continue metformin 1000 mg BID, GLipizide XL 10 mg daily, Lantus 15 units daily for now. She is interested in trying Trulicity, will await A1C result.   Foot exam today. She will schedule her eye exam. Managed on statin. Urine micro due and pending. Pneumonia vaccine UTD.  Follow up in 3-6 months based off of A1C result.

## 2020-10-26 NOTE — Assessment & Plan Note (Signed)
No new symptoms. Compliant to aspirin 81 mg and atorvastatin 80 mg, continue same.   Repeat lipid panel pending.

## 2020-10-26 NOTE — Assessment & Plan Note (Signed)
Chronic since stroke, doing well on gabapentin 300 mg HS and amitriptyline 50 mg HS. Continue same. Refills provided.

## 2020-10-27 ENCOUNTER — Other Ambulatory Visit: Payer: Self-pay | Admitting: Primary Care

## 2020-10-27 DIAGNOSIS — E1159 Type 2 diabetes mellitus with other circulatory complications: Secondary | ICD-10-CM

## 2020-10-27 MED ORDER — LANTUS SOLOSTAR 100 UNIT/ML ~~LOC~~ SOPN: 25.0000 [IU] | PEN_INJECTOR | Freq: Every day | SUBCUTANEOUS | 1 refills | Status: DC

## 2020-11-19 NOTE — Telephone Encounter (Signed)
Joellen, we can add her on for 11/20/20

## 2020-11-20 ENCOUNTER — Telehealth (INDEPENDENT_AMBULATORY_CARE_PROVIDER_SITE_OTHER): Payer: BC Managed Care – PPO | Admitting: Primary Care

## 2020-11-20 ENCOUNTER — Telehealth: Payer: BC Managed Care – PPO | Admitting: Family Medicine

## 2020-11-20 ENCOUNTER — Other Ambulatory Visit: Payer: Self-pay

## 2020-11-20 DIAGNOSIS — R300 Dysuria: Secondary | ICD-10-CM | POA: Insufficient documentation

## 2020-11-20 LAB — POC URINALSYSI DIPSTICK (AUTOMATED)
Bilirubin, UA: NEGATIVE
Blood, UA: NEGATIVE
Glucose, UA: NEGATIVE
Ketones, UA: NEGATIVE
Leukocytes, UA: NEGATIVE
Nitrite, UA: NEGATIVE
Protein, UA: POSITIVE — AB
Spec Grav, UA: 1.01 (ref 1.010–1.025)
Urobilinogen, UA: 0.2 E.U./dL
pH, UA: 8 (ref 5.0–8.0)

## 2020-11-20 NOTE — Patient Instructions (Signed)
I will be in touch in a few days regarding your urine test result.  Ensure you are consuming 64 ounces of water daily.  It was a pleasure to see you today! Mayra Reel, NP-C

## 2020-11-20 NOTE — Assessment & Plan Note (Signed)
Acute urinary symptoms for the last two days.  UA today grossly negative, she does have some protein. Culture sent. Encourage water intake.  Await culture results.

## 2020-11-20 NOTE — Progress Notes (Signed)
Subjective:    Patient ID: Haley Gomez, female    DOB: 03-27-1966, 55 y.o.   MRN: [id number]  HPI  Virtual Visit via Video Note  I connected with Haley Gomez on 11/20/20 at 11:00 AM EST by a video enabled telemedicine application and verified that I am speaking with the correct person using two identifiers.  Location: Patient: Home Provider: Office Participants: Patient and myself   I discussed the limitations of evaluation and management by telemedicine and the availability of in person appointments. The patient expressed understanding and agreed to proceed.  History of Present Illness:  [name] is a 55 year old female with a history of hot flashes, CVA, vitamin B12 deficiency, type 2 diabetes, UTI who presents today with a chief complaint of dysuria  She also reports urinary frequency, urinary urgency with some incontinence. Symptoms began two days ago. She denies hematuria, fevers, nausea.   She has a history of UTI's in the past, these symptoms feel similar. She's not taken anything OTC for symptoms.  She endorses not drinking much water the day symptoms began, but she is making a conscious effort to increase water intake.   Observations/Objective:  Alert and oriented. Appears well, not sickly. No distress. Speaking in complete sentences.   Assessment and Plan:  Acute urinary symptoms for the last two days.  UA today grossly negative, she does have some protein. Culture sent. Encourage water intake.  Await culture results.  Follow Up Instructions:  I will be in touch in a few days regarding your urine test result.  Ensure you are consuming 64 ounces of water daily.  It was a pleasure to see you today! Allie Bossier, NP-C    I discussed the assessment and treatment plan with the patient. The patient was provided an opportunity to ask questions and all were answered. The patient agreed with the plan and demonstrated an understanding of the  instructions.   The patient was advised to call back or seek an in-person evaluation if the symptoms worsen or if the condition fails to improve as anticipated.    Pleas Koch, NP    Review of Systems  Constitutional: Negative for fever.  Gastrointestinal: Negative for abdominal pain and nausea.  Genitourinary: Positive for frequency and urgency. Negative for hematuria.       Past Medical History:  Diagnosis Date  . Diabetes mellitus without complication ([place])   . Hyperlipidemia   . Right thalamic infarction [facility])      Social History   Socioeconomic History  . Marital status: Divorced    Spouse name: Not on file  . Number of children: Not on file  . Years of education: Not on file  . Highest education level: Not on file  Occupational History  . Not on file  Tobacco Use  . Smoking status: Never Smoker  . Smokeless tobacco: Never Used  Vaping Use  . Vaping Use: Never used  Substance and Sexual Activity  . Alcohol use: Yes    Alcohol/week: 0.0 standard drinks    Comment: social  . Drug use: Never  . Sexual activity: Not on file  Other Topics Concern  . Not on file  Social History Narrative   Single.   3 children.   Works as a Marine scientist at [employer]   Enjoys traveling, spending time with friends, exercising, reading.   Social Determinants of Health   Financial Resource Strain: Not on file  Food Insecurity: Not on file  Transportation Needs:  Not on file  Physical Activity: Not on file  Stress: Not on file  Social Connections: Not on file  Intimate Partner Violence: Not on file    Past Surgical History:  Procedure Laterality Date  . CESAREAN SECTION    . GALLBLADDER SURGERY  1997    Family History  Problem Relation Age of Onset  . Diabetes Mother   . Alcohol abuse Father   . Hypertension Father   . Kidney disease Father   . Stroke Father     Allergies  Allergen Reactions  . Bactrim [Sulfamethoxazole-Trimethoprim] Nausea Only    Current  Outpatient Medications on File Prior to Visit  Medication Sig Dispense Refill  . amitriptyline (ELAVIL) 50 MG tablet Take 1 tablet (50 mg total) by mouth at bedtime. For nerve pain 90 tablet 3  . ASPIRIN LOW DOSE 81 MG EC tablet TAKE 1 TABLET (81 MG TOTAL) BY MOUTH DAILY. SWALLOW WHOLE. 90 tablet 3  . atorvastatin (LIPITOR) 80 MG tablet TAKE 1 TABLET BY MOUTH EVERY DAY FOR CHOLESTEROL 90 tablet 0  . BD PEN NEEDLE NANO 2ND GEN 32G X 4 MM MISC USE WITH INSULIN PEN AS DIRECTED 30 each 5  . blood glucose meter kit and supplies KIT Dispense based on patient and insurance preference. Use up to four times daily as directed. (FOR ICD-9 250.00, 250.01). 1 each 0  . Continuous Blood Gluc Receiver (FREESTYLE LIBRE 14 DAY READER) DEVI 1 Device by Does not apply route 3 (three) times daily. 1 each 0  . Continuous Blood Gluc Sensor (FREESTYLE LIBRE 14 DAY SENSOR) MISC USE 3 (THREE) TIMES DAILY AS DIRECTED. CHANGE EVERY 14 DAYS 2 each 28  . diphenhydrAMINE (BENADRYL) 25 MG tablet Take 25 mg by mouth every 6 (six) hours as needed for sleep.    . fluticasone (FLONASE) 50 MCG/ACT nasal spray Place 1 spray into both nostrils 2 (two) times daily. 16 g 0  . gabapentin (NEURONTIN) 300 MG capsule Take 1 capsule (300 mg total) by mouth at bedtime. For nerve pain. 90 capsule 3  . glipiZIDE (GLUCOTROL XL) 10 MG 24 hr tablet TAKE 1 TABLET BY MOUTH EVERY DAY WITH BREAKFAST FOR DIABETES. 90 tablet 3  . insulin glargine (LANTUS SOLOSTAR) 100 UNIT/ML Solostar Pen Inject 25 Units into the skin at bedtime. For diabetes. 15 mL 1  . metFORMIN (GLUCOPHAGE) 1000 MG tablet TAKE 1 TABLET (1,000 MG TOTAL) BY MOUTH 2 (TWO) TIMES DAILY. FOR DIABETES 180 tablet 0  . ONETOUCH VERIO test strip USE 4 TIMES A DAY AS DIRECTED 100 strip 5   No current facility-administered medications on file prior to visit.    There were no vitals taken for this visit.   Objective:   Physical Exam Constitutional:      General: She is not in acute  distress.    Appearance: She is not ill-appearing.  Neurological:     Mental Status: She is alert and oriented to person, place, and time.  Psychiatric:        Mood and Affect: Mood normal.            Assessment & Plan:

## 2020-11-20 NOTE — Telephone Encounter (Signed)
Spoke to patient she will bring sample and set her up with virtual

## 2020-11-21 ENCOUNTER — Other Ambulatory Visit: Payer: Self-pay | Admitting: Primary Care

## 2020-11-21 DIAGNOSIS — E785 Hyperlipidemia, unspecified: Secondary | ICD-10-CM

## 2020-11-21 LAB — URINE CULTURE
MICRO NUMBER:: 11580352
Result:: NO GROWTH
SPECIMEN QUALITY:: ADEQUATE

## 2020-11-24 ENCOUNTER — Other Ambulatory Visit: Payer: Self-pay | Admitting: Primary Care

## 2020-11-24 DIAGNOSIS — E1159 Type 2 diabetes mellitus with other circulatory complications: Secondary | ICD-10-CM

## 2020-12-13 ENCOUNTER — Emergency Department (HOSPITAL_COMMUNITY)
Admission: EM | Admit: 2020-12-13 | Discharge: 2020-12-13 | Disposition: A | Discharge status: Home / Self Care | Payer: BC Managed Care – PPO | Attending: Emergency Medicine | Admitting: Emergency Medicine

## 2020-12-13 ENCOUNTER — Other Ambulatory Visit: Payer: Self-pay

## 2020-12-13 ENCOUNTER — Emergency Department (HOSPITAL_COMMUNITY): Payer: BC Managed Care – PPO

## 2020-12-13 ENCOUNTER — Encounter (HOSPITAL_COMMUNITY): Payer: Self-pay | Admitting: Emergency Medicine

## 2020-12-13 DIAGNOSIS — R200 Anesthesia of skin: Secondary | ICD-10-CM | POA: Diagnosis not present

## 2020-12-13 DIAGNOSIS — R29810 Facial weakness: Secondary | ICD-10-CM | POA: Diagnosis not present

## 2020-12-13 DIAGNOSIS — R202 Paresthesia of skin: Secondary | ICD-10-CM | POA: Diagnosis not present

## 2020-12-13 DIAGNOSIS — Z794 Long term (current) use of insulin: Secondary | ICD-10-CM | POA: Insufficient documentation

## 2020-12-13 DIAGNOSIS — Z8673 Personal history of transient ischemic attack (TIA), and cerebral infarction without residual deficits: Secondary | ICD-10-CM | POA: Insufficient documentation

## 2020-12-13 DIAGNOSIS — Z7982 Long term (current) use of aspirin: Secondary | ICD-10-CM | POA: Diagnosis not present

## 2020-12-13 DIAGNOSIS — Z7984 Long term (current) use of oral hypoglycemic drugs: Secondary | ICD-10-CM | POA: Diagnosis not present

## 2020-12-13 DIAGNOSIS — E119 Type 2 diabetes mellitus without complications: Secondary | ICD-10-CM | POA: Diagnosis not present

## 2020-12-13 DIAGNOSIS — I639 Cerebral infarction, unspecified: Secondary | ICD-10-CM | POA: Diagnosis not present

## 2020-12-13 DIAGNOSIS — R000 Tachycardia, unspecified: Secondary | ICD-10-CM | POA: Diagnosis not present

## 2020-12-13 HISTORY — DX: Cerebral infarction, unspecified: I63.9

## 2020-12-13 LAB — CBC
HCT: 40.8 % (ref 36.0–46.0)
Hemoglobin: 12.8 g/dL (ref 12.0–15.0)
MCH: 24.2 pg — ABNORMAL LOW (ref 26.0–34.0)
MCHC: 31.4 g/dL (ref 30.0–36.0)
MCV: 77 fL — ABNORMAL LOW (ref 80.0–100.0)
Platelets: 389 10*3/uL (ref 150–400)
RBC: 5.3 MIL/uL — ABNORMAL HIGH (ref 3.87–5.11)
RDW: 17.2 % — ABNORMAL HIGH (ref 11.5–15.5)
WBC: 10.8 10*3/uL — ABNORMAL HIGH (ref 4.0–10.5)
nRBC: 0 % (ref 0.0–0.2)

## 2020-12-13 LAB — DIFFERENTIAL
Abs Immature Granulocytes: 0.05 10*3/uL (ref 0.00–0.07)
Basophils Absolute: 0.1 10*3/uL (ref 0.0–0.1)
Basophils Relative: 1 %
Eosinophils Absolute: 0.2 10*3/uL (ref 0.0–0.5)
Eosinophils Relative: 2 %
Immature Granulocytes: 1 %
Lymphocytes Relative: 24 %
Lymphs Abs: 2.6 10*3/uL (ref 0.7–4.0)
Monocytes Absolute: 0.6 10*3/uL (ref 0.1–1.0)
Monocytes Relative: 5 %
Neutro Abs: 7.3 10*3/uL (ref 1.7–7.7)
Neutrophils Relative %: 67 %

## 2020-12-13 LAB — COMPREHENSIVE METABOLIC PANEL
ALT: 22 U/L (ref 0–44)
AST: 22 U/L (ref 15–41)
Albumin: 4.2 g/dL (ref 3.5–5.0)
Alkaline Phosphatase: 120 U/L (ref 38–126)
Anion gap: 10 (ref 5–15)
BUN: 12 mg/dL (ref 6–20)
CO2: 26 mmol/L (ref 22–32)
Calcium: 9.7 mg/dL (ref 8.9–10.3)
Chloride: 101 mmol/L (ref 98–111)
Creatinine, Ser: 0.53 mg/dL (ref 0.44–1.00)
GFR, Estimated: >60 mL/min (ref >60)
Glucose, Bld: 118 mg/dL — ABNORMAL HIGH (ref 70–99)
Potassium: 3.9 mmol/L (ref 3.5–5.1)
Sodium: 137 mmol/L (ref 135–145)
Total Bilirubin: 0.9 mg/dL (ref 0.3–1.2)
Total Protein: 8.2 g/dL — ABNORMAL HIGH (ref 6.5–8.1)

## 2020-12-13 LAB — APTT: aPTT: 27 seconds (ref 24–36)

## 2020-12-13 LAB — CBG MONITORING, ED
Glucose-Capillary: 101 mg/dL — ABNORMAL HIGH (ref 70–99)
Glucose-Capillary: 70 mg/dL (ref 70–99)

## 2020-12-13 LAB — I-STAT BETA HCG BLOOD, ED (MC, WL, AP ONLY): I-stat hCG, quantitative: <5 m[IU]/mL (ref <5)

## 2020-12-13 LAB — PROTIME-INR
INR: 0.9 (ref 0.8–1.2)
Prothrombin Time: 12.2 seconds (ref 11.4–15.2)

## 2020-12-13 MED ORDER — SODIUM CHLORIDE 0.9% FLUSH: 3.0000 mL | Freq: Once | INTRAVENOUS | Status: DC

## 2020-12-13 NOTE — ED Notes (Signed)
No Code Stroke activation per Dr. Charm Barges

## 2020-12-13 NOTE — ED Notes (Signed)
Patient transported to MRI 

## 2020-12-13 NOTE — ED Provider Notes (Signed)
[place] EMERGENCY DEPARTMENT Provider Note   CSN: [id number] Arrival date & time: 12/13/20  1027     History Chief Complaint  Patient presents with  . facial numbness    Haley Gomez is a 55 y.o. female.  She has a history and a prior stroke which left her with some left-sided hyperesthesia of her lower face left hand and left foot.  She is complaining of some twitching to the left side of her face and some increased numbness around the left side of her mouth that began at 915 today while at home.  She said this is similar to how her stroke symptoms started 2 years ago.  No headache blurry vision focal weakness.  No difficulty with speech. The history is provided by the patient.  Cerebrovascular Accident This is a recurrent problem. The current episode started 1 to 2 hours ago. The problem occurs constantly. The problem has not changed since onset.Pertinent negatives include no chest pain, no abdominal pain, no headaches and no shortness of breath. Nothing aggravates the symptoms. Nothing relieves the symptoms. She has tried nothing for the symptoms. The treatment provided no relief.       Past Medical History:  Diagnosis Date  . Diabetes mellitus without complication ([place])   . Hyperlipidemia   . Right thalamic infarction ([place])   . Stroke [facility])     Patient Active Problem List   Diagnosis Date Noted  . Dysuria 11/20/2020  . Seasonal allergies 02/26/2020  . Nerve pain 09/02/2019  . Vitamin B12 deficiency 09/02/2019  . Chest pain 04/22/2019  . GERD (gastroesophageal reflux disease) 04/22/2019  . Leucocytosis 12/19/2018  . History of CVA (cerebrovascular accident) 12/19/2018  . Hot flashes 06/17/2015  . Preventative health care 06/17/2015  . Type 2 diabetes mellitus ([employer]) 03/05/2015  . Hyperlipidemia 03/05/2015    Past Surgical History:  Procedure Laterality Date  . CESAREAN SECTION    . GALLBLADDER SURGERY  1997     OB History   No  obstetric history on file.     Family History  Problem Relation Age of Onset  . Diabetes Mother   . Alcohol abuse Father   . Hypertension Father   . Kidney disease Father   . Stroke Father     Social History   Tobacco Use  . Smoking status: Never Smoker  . Smokeless tobacco: Never Used  Vaping Use  . Vaping Use: Never used  Substance Use Topics  . Alcohol use: Yes    Alcohol/week: 0.0 standard drinks    Comment: social  . Drug use: Never    Home Medications Prior to Admission medications   Medication Sig Start Date End Date Taking? Authorizing Provider  amitriptyline (ELAVIL) 50 MG tablet Take 1 tablet (50 mg total) by mouth at bedtime. For nerve pain 10/26/20   Pleas Koch, NP  ASPIRIN LOW DOSE 81 MG EC tablet TAKE 1 TABLET (81 MG TOTAL) BY MOUTH DAILY. SWALLOW WHOLE. 04/08/20   Pleas Koch, NP  atorvastatin (LIPITOR) 80 MG tablet TAKE 1 TABLET BY MOUTH EVERY DAY FOR CHOLESTEROL 11/23/20   Pleas Koch, NP  BD PEN NEEDLE NANO 2ND GEN 32G X 4 MM MISC USE WITH INSULIN PEN AS DIRECTED 06/10/20   Pleas Koch, NP  blood glucose meter kit and supplies KIT Dispense based on patient and insurance preference. Use up to four times daily as directed. (FOR ICD-9 250.00, 250.01). 12/20/18   Elgergawy, Silver Huguenin, MD  Continuous Blood Gluc Receiver (FREESTYLE LIBRE 14 DAY READER) DEVI 1 Device by Does not apply route 3 (three) times daily. 09/02/19   Pleas Koch, NP  Continuous Blood Gluc Sensor (FREESTYLE LIBRE 14 DAY SENSOR) MISC USE 3 (THREE) TIMES DAILY AS DIRECTED. CHANGE EVERY 14 DAYS 10/05/20   Pleas Koch, NP  diphenhydrAMINE (BENADRYL) 25 MG tablet Take 25 mg by mouth every 6 (six) hours as needed for sleep.    [provider]  fluticasone (FLONASE) 50 MCG/ACT nasal spray Place 1 spray into both nostrils 2 (two) times daily. 02/26/20   Pleas Koch, NP  gabapentin (NEURONTIN) 300 MG capsule Take 1 capsule (300 mg total) by mouth at  bedtime. For nerve pain. 10/26/20   Pleas Koch, NP  glipiZIDE (GLUCOTROL XL) 10 MG 24 hr tablet TAKE 1 TABLET BY MOUTH EVERY DAY WITH BREAKFAST FOR DIABETES. 08/22/20   Pleas Koch, NP  insulin glargine (LANTUS SOLOSTAR) 100 UNIT/ML Solostar Pen Inject 25 Units into the skin at bedtime. For diabetes. 10/27/20   Pleas Koch, NP  metFORMIN (GLUCOPHAGE) 1000 MG tablet TAKE 1 TABLET (1,000 MG TOTAL) BY MOUTH 2 (TWO) TIMES DAILY. FOR DIABETES 11/24/20   Pleas Koch, NP  [facility] VERIO test strip USE 4 TIMES A DAY AS DIRECTED 05/15/19   Pleas Koch, NP    Allergies    Bactrim [sulfamethoxazole-trimethoprim]  Review of Systems   Review of Systems  Constitutional: Negative for fever.  HENT: Negative for sore throat.   Eyes: Negative for visual disturbance.  Respiratory: Positive for cough. Negative for shortness of breath.   Cardiovascular: Negative for chest pain.  Gastrointestinal: Negative for abdominal pain.  Genitourinary: Negative for dysuria.  Musculoskeletal: Negative for neck pain.  Skin: Negative for rash.  Neurological: Positive for dizziness and numbness. Negative for speech difficulty, weakness and headaches.    Physical Exam Updated Vital Signs BP (!) 143/93 (BP Location: Left Arm)   Pulse (!) 115   Temp (!) 97.5 F (36.4 C) (Oral)   Resp 20   SpO2 97%   Physical Exam Vitals and nursing note reviewed.  Constitutional:      General: She is not in acute distress.    Appearance: Normal appearance. She is well-developed.  HENT:     Head: Normocephalic and atraumatic.  Eyes:     Conjunctiva/sclera: Conjunctivae normal.  Cardiovascular:     Rate and Rhythm: Normal rate and regular rhythm.     Heart sounds: No murmur heard.   Pulmonary:     Effort: Pulmonary effort is normal. No respiratory distress.     Breath sounds: Normal breath sounds.  Abdominal:     Palpations: Abdomen is soft.     Tenderness: There is no abdominal tenderness.   Musculoskeletal:     Cervical back: Neck supple.  Skin:    General: Skin is warm and dry.     Capillary Refill: Capillary refill takes less than 2 seconds.  Neurological:     Mental Status: She is alert and oriented to person, place, and time.     Cranial Nerves: Cranial nerve deficit present.     Sensory: Sensory deficit present.     Motor: No weakness.     Coordination: Coordination normal.     Gait: Gait normal.     Comments: She is awake and alert.  No facial asymmetry.  Normal speech.  Subjective decreased sensation left side of mouth and cheek.  Normal forehead.  Strength  5 out of 5 upper and lower extremities symmetric.  Subjective decrease sensation in her left palm.  Normal gait.     ED Results / Procedures / Treatments   Labs (all labs ordered are listed, but only abnormal results are displayed) Labs Reviewed  CBC - Abnormal; Notable for the following components:      Result Value   WBC 10.8 (*)    RBC 5.30 (*)    MCV 77.0 (*)    MCH 24.2 (*)    RDW 17.2 (*)    All other components within normal limits  COMPREHENSIVE METABOLIC PANEL - Abnormal; Notable for the following components:   Glucose, Bld 118 (*)    Total Protein 8.2 (*)    All other components within normal limits  CBG MONITORING, ED - Abnormal; Notable for the following components:   Glucose-Capillary 101 (*)    All other components within normal limits  PROTIME-INR  APTT  DIFFERENTIAL  I-STAT BETA HCG BLOOD, ED ([facility], [facility], [facility] ONLY)  CBG MONITORING, ED    EKG EKG Interpretation  Date/Time:  Sunday December 13 2020 10:42:05 EDT Ventricular Rate:  111 PR Interval:  150 QRS Duration: 72 QT Interval:  338 QTC Calculation: 459 R Axis:   38 Text Interpretation: Sinus tachycardia Cannot rule out Anterior infarct , age undetermined Abnormal ECG No significant change since prior 3/20 Confirmed by Aletta Edouard [phone]) on 12/13/2020 10:54:35 AM   Radiology MR BRAIN WO CONTRAST  Result Date:  12/13/2020 CLINICAL DATA:  Left facial numbness upon waking. EXAM: MRI HEAD WITHOUT CONTRAST TECHNIQUE: Multiplanar, multiecho pulse sequences of the brain and surrounding structures were obtained without intravenous contrast. COMPARISON:  CT and MRI studies March 2020. FINDINGS: Brain: Diffusion imaging does not show any acute or subacute infarction. There is an old small vessel infarction in the right lateral thalamus. There is minimal abnormal T2 and FLAIR signal in the white matter of the right forceps major which is unchanged. No sign of interval infarction, mass lesion, hemorrhage, hydrocephalus or extra-axial collection. Vascular: Major vessels at the base of the brain show flow. Skull and upper cervical spine: Negative Sinuses/Orbits: Clear/normal Other: None IMPRESSION: No acute finding by MRI. Old small vessel infarction right lateral thalamus. Minimal abnormal T2 and FLAIR signal in the white matter of the right forceps major, unchanged since 2 years ago. Electronically Signed   By: Nelson Chimes M.D.   On: 12/13/2020 15:17    Procedures Procedures   Medications Ordered in ED Medications  sodium chloride flush (NS) 0.9 % injection 3 mL (has no administration in time range)    ED Course  I have reviewed the triage vital signs and the nursing notes.  Pertinent labs & imaging results that were available during my care of the patient were reviewed by me and considered in my medical decision making (see chart for details).  Clinical Course as of 12/13/20 1100  Sun Dec 13, 2020  1100 Reviewed case with neuro hospitalist Dr. Quinn Axe.  She felt that the patient does not need a stroke activation at this time and would proceed with MRI.  Asked to formally consult if anything on the MRI. [MB]    Clinical Course User Index [MB] Hayden Rasmussen, MD   MDM Rules/Calculators/A&P                         This patient complains of left facial twitching and facial numbness; this involves an extensive  number of treatment Options and is a complaint that carries with it a high risk of complications and Morbidity. The differential includes stroke, TIA, metabolic derangement, recrudescence of old stroke  I ordered, reviewed and interpreted labs, which included CBC with mildly elevated white count, normal hemoglobin, chemistries fairly normal mild elevation of glucose, pregnancy test negative, coags normal I ordered imaging studies which included right brain and I independently    visualized and interpreted imaging which showed no acute findings Previous records obtained and reviewed in epic including prior stroke work-up 2 years ago  After the interventions stated above, I reevaluated the patient and found patient to have no progressive symptoms.  I reviewed her results with her and she is comfortable plan for discharge and outpatient follow-up with her neurologist.  Return instructions discussed   Final Clinical Impression(s) / ED Diagnoses Final diagnoses:  Facial numbness    Rx / DC Orders ED Discharge Orders    None       Hayden Rasmussen, MD 12/13/20 1752

## 2020-12-13 NOTE — ED Triage Notes (Addendum)
Pt reports history of stroke.  Felt normal when she woke up this morning.  C/o L sided facial twitching and L sided facial numbness, L sided lip tingling that started at 9:15am while cooking.  States she normally has hypersensitivity to L side since stroke but feels like she had lidocaine on L side of face.  No facial droop.  No arm drift.  Speech clear.  Dr. Charm Barges notified of pt and came to triage to assess for ? Code Stroke.

## 2020-12-13 NOTE — ED Notes (Signed)
MRI called. Pt ready for MRI, denies claustrophobia, MRI will send for pt as soon as they are ready. Pt and family updated

## 2020-12-13 NOTE — Discharge Instructions (Signed)
You were seen in the emergency department for some twitching on the left side of your face and some increased numbness.  You had blood work and an MRI that did not show any obvious signs of stroke.  Please contact your neurologist for close follow-up.  Return to the emergency department for any worsening or concerning symptoms

## 2020-12-13 NOTE — ED Notes (Addendum)
CBG 70. Dr. Charm Barges notified. This NT provided pt with crackers and peanut butter per Dr Charm Barges

## 2020-12-13 NOTE — ED Notes (Addendum)
Pt transported to CT- Ct cancelled, pt back in room

## 2020-12-16 IMAGING — CT CT ANGIOGRAPHY HEAD
1 of 12 series · 5 of 33 positions shown · IV contrast (OMNI 350)
Comparison: MR brain 12/19/2018. Intracranial and extracranial MRA
02/10/2018.

CLINICAL DATA: Stroke follow-up. LEFT-sided tingling and numbness.
Acute RIGHT thalamic infarct. Stroke risk factors include diabetes
mellitus and hyperlipidemia.

EXAM:
CT ANGIOGRAPHY HEAD AND NECK
TECHNIQUE: Multidetector CT imaging of the head and neck was performed using
the standard protocol during bolus administration of intravenous
contrast. Multiplanar CT image reconstructions and MIPs were
obtained to evaluate the vascular anatomy. Carotid stenosis
measurements (when applicable) are obtained utilizing NASCET
criteria, using the distal internal carotid diameter as the
denominator.
CONTRAST:  50mL OMNIPAQUE IOHEXOL 350 MG/ML SOLN

[Series 11: cta neck axial · axial · 0.41mm/px · z∈[-317,-88]mm · 5 of 345 slices shown]
[im 58/345  soft-tissue]
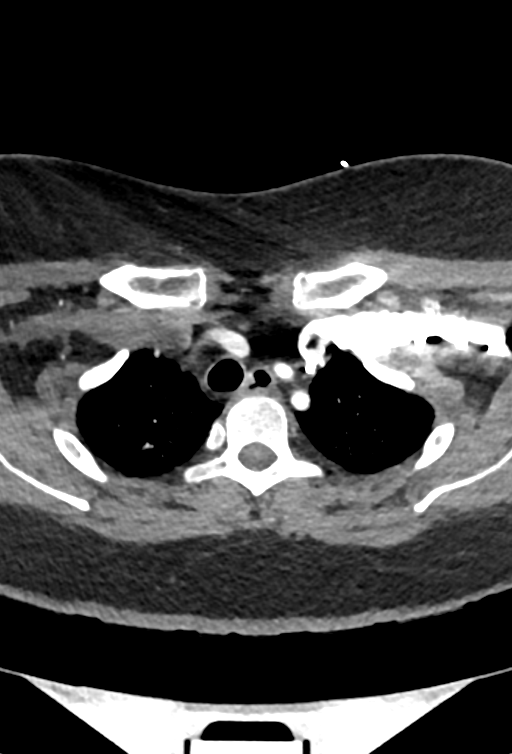
[im 115/345  bone]
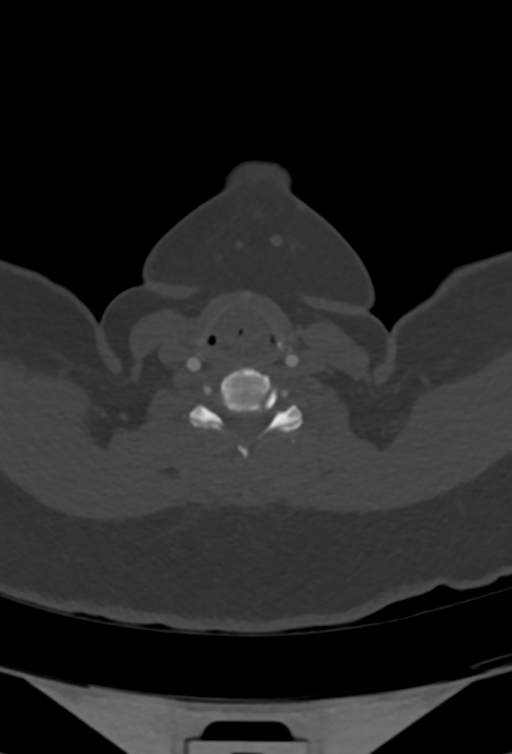
[im 173/345  soft-tissue]
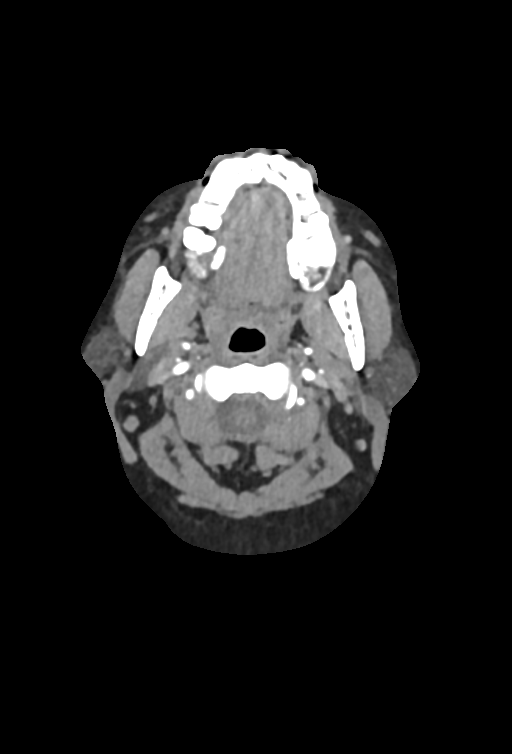
[im 230/345  bone]
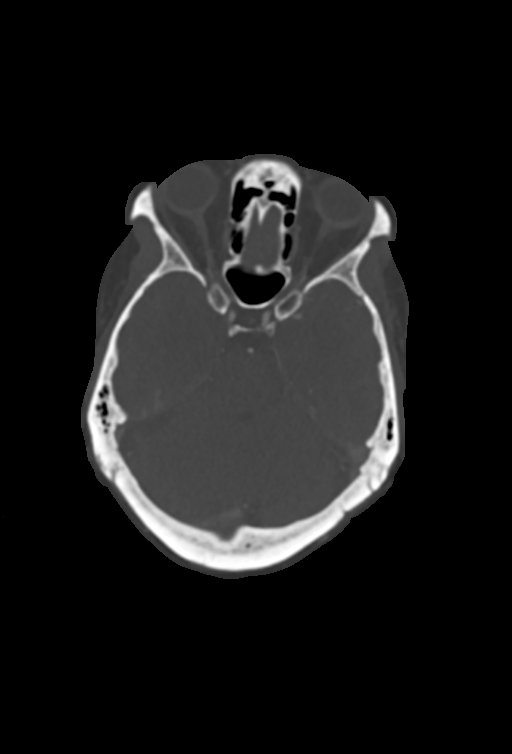
[im 287/345  soft-tissue]
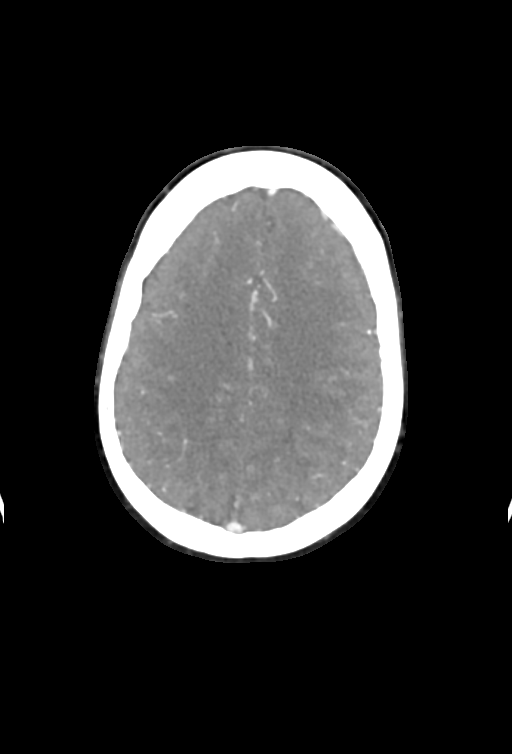

[5 of 33 positions shown; findings below may reference images not displayed]

FINDINGS: CTA NECK FINDINGS

Aortic arch: Standard branching. Imaged portion shows no evidence of
aneurysm or dissection. No significant stenosis of the major arch
vessel origins.

Right carotid system: No evidence of dissection, stenosis (50% or
greater) or occlusion.

Left carotid system: No evidence of dissection, stenosis (50% or
greater) or occlusion.

Vertebral arteries: BILATERAL patent. RIGHT slightly larger. No
evidence of dissection, stenosis (50% or greater) or occlusion.

Skeleton: Spondylosis.

Other neck: No masses.

Upper chest: Mild vascular congestion. No pneumothorax or nodule.

Review of the MIP images confirms the above findings

CTA HEAD FINDINGS

Anterior circulation: Calcification of the cavernous internal
carotid arteries consistent with cerebrovascular atherosclerotic
disease. No significant stenosis, proximal occlusion, aneurysm, or
vascular malformation.

Posterior circulation: Vertebral arteries are patent. Basilar artery
is patent. There is slight narrowing of the proximal P2 segment
RIGHT posterior cerebral artery, estimated 50%, not clearly flow
reducing. No cerebellar branch occlusion.

Venous sinuses: As permitted by contrast timing, patent.

Anatomic variants: None of significance.

Delayed phase: No abnormal intracranial enhancement.

Review of the MIP images confirms the above findings
IMPRESSION: No extracranial or intracranial stenosis of significance.

Slight narrowing of proximal P2 segment RIGHT PCA, appears similar
to priors.

## 2020-12-30 ENCOUNTER — Encounter: Payer: Self-pay | Admitting: Adult Health

## 2020-12-30 ENCOUNTER — Inpatient Hospital Stay: Payer: Self-pay | Admitting: Adult Health

## 2020-12-30 ENCOUNTER — Telehealth: Payer: Self-pay | Admitting: Adult Health

## 2020-12-30 ENCOUNTER — Ambulatory Visit (INDEPENDENT_AMBULATORY_CARE_PROVIDER_SITE_OTHER): Payer: BC Managed Care – PPO | Admitting: Adult Health

## 2020-12-30 VITALS — BP 125/77 | HR 101 | Ht 62.0 in | Wt 170.0 lb

## 2020-12-30 DIAGNOSIS — G459 Transient cerebral ischemic attack, unspecified: Secondary | ICD-10-CM

## 2020-12-30 DIAGNOSIS — I6621 Occlusion and stenosis of right posterior cerebral artery: Secondary | ICD-10-CM

## 2020-12-30 DIAGNOSIS — I639 Cerebral infarction, unspecified: Secondary | ICD-10-CM

## 2020-12-30 DIAGNOSIS — I6381 Other cerebral infarction due to occlusion or stenosis of small artery: Secondary | ICD-10-CM

## 2020-12-30 DIAGNOSIS — G5621 Lesion of ulnar nerve, right upper limb: Secondary | ICD-10-CM

## 2020-12-30 MED ORDER — CLOPIDOGREL BISULFATE 75 MG PO TABS: 75.0000 mg | ORAL_TABLET | Freq: Every day | ORAL | 3 refills | Status: DC

## 2020-12-30 NOTE — Progress Notes (Signed)
[facility] [address] [place]. [place] [address] [phone]       STROKE FOLLOW UP NOTE  [name] Date of Birth:  [birth year] Medical Record Number:  [id number]   Reason for Referral:  stroke follow up    CHIEF COMPLAINT:  Chief Complaint  Patient presents with  . Follow-up    Rm 14 alone Pt is well, no twitching or numbness, no complaints     HPI:  Today, 12/30/2020, Haley Gomez returns after recent ED evaluation for twitching on the left side of her face and increased numbness around the left side of her mouth that began on 12/13/2020.  She was previously seen in office 02/05/2019 for hospital follow-up regarding right thalamic stroke with recommended 19-monthfollow-up but unfortunately has not followed up since that time.  Prior stroke residual deficits of left-sided paresthesias.  MRI negative for acute stroke.  Lab work WNL for patient.  Recent lab work by PCP 09/2020 showed LDL 43 and A1c 12.3 (increased from 9.2).  Reports complete resolution of facial symptoms and no recurrence since 12/13/2020 - reports URI for 2 weeks prior to event - COVID (-) Currently on amitriptyline 50 mg nightly and gabapentin 300 mg nightly per PCP for post stroke paresthesias with benefit - reports continued left hand and bottom of foot symptoms but stable Reports compliance on aspirin 81 mg daily and atorvastatin 80 mg daily -denies related side effects Blood pressure today 125/77 Glucose levels have been improving since medication adjustments per PCP after elevated A1c  She also reports occasional right arm and right outer thigh numbness/tingling that will awaken her in the middle the night when sleeping on that side and will quickly resolve after she goes on her back or left side. Right arm symptoms from elbow down into forearm and outer hand with pinky and ring finger involvement but again after movement, numbness will quickly resolve. Right thigh symptoms only  present when laying on hip - denies lower back or hip pain - symptoms will quickly resolve and do not occur at any other time.  No further concerns at this time      History provided for reference purposes only Initial visit 02/05/2019: Ms. Haley KABLERis a 55y.o. female with history of DB and HTN  who presented with L face and arm numbness.  Stroke work-up revealed right thalamic infarct as evidenced on MRI secondary to small vessel disease source.  CTA showed slight proximal right P2 narrowing.  2D echo unremarkable.  LDL 199 -increase atorvastatin dose 20 mg to 80 mg daily and A1c 12.5 -recommended close PCP follow-up at discharge.  HTN stable.  Other stroke risk factors include EtOH use and family history of stroke (father) but no personal history of stroke.  Recommended DAPT for 3 weeks and aspirin alone.  Residual deficits decreased left-sided sensation and was discharged home in stable condition without therapy needs.  She continues to have residual left-sided numbness and paresthesias.  As she is left-hand dominant, this is caused her to have difficulty performing activities such as writing or using a keyboard.  Due to this, she has not returned to work at this time and is currently receiving short-term disability through her PCP.  PCP initiated gabapentin 100 mg 3 times daily without benefit.  She describes pain as a burning sensation and tightness which worsens with activity.  She continues to participate in PT/OT which she believes has been helping.  Mild sensation of daytime fatigue/mind  fogginess throughout the day.  Completed 3 weeks DAPT and continues on aspirin alone without side effects of bleeding or bruising.  Continues on atorvastatin 80 mg without side effects of myalgias.  Continues to follow with PCP for HTN and DM management.  No further concerns at this time.  Denies new or worsening stroke/TIA symptoms.      ROS:   14 system review of systems performed and negative  with exception of numbness and tingling  PMH:  Past Medical History:  Diagnosis Date  . Diabetes mellitus without complication ([place])   . Hyperlipidemia   . Right thalamic infarction ([place])   . Stroke [facility])     PSH:  Past Surgical History:  Procedure Laterality Date  . CESAREAN SECTION    . GALLBLADDER SURGERY  1997    Social History:  Social History   Socioeconomic History  . Marital status: Divorced    Spouse name: Not on file  . Number of children: Not on file  . Years of education: Not on file  . Highest education level: Not on file  Occupational History  . Not on file  Tobacco Use  . Smoking status: Never Smoker  . Smokeless tobacco: Never Used  Vaping Use  . Vaping Use: Never used  Substance and Sexual Activity  . Alcohol use: Yes    Alcohol/week: 0.0 standard drinks    Comment: social  . Drug use: Never  . Sexual activity: Not on file  Other Topics Concern  . Not on file  Social History Narrative   Single.   3 children.   Works as a Marine scientist at [employer]   Enjoys traveling, spending time with friends, exercising, reading.   Social Determinants of Health   Financial Resource Strain: Not on file  Food Insecurity: Not on file  Transportation Needs: Not on file  Physical Activity: Not on file  Stress: Not on file  Social Connections: Not on file  Intimate Partner Violence: Not on file    Family History:  Family History  Problem Relation Age of Onset  . Diabetes Mother   . Alcohol abuse Father   . Hypertension Father   . Kidney disease Father   . Stroke Father     Medications:   Current Outpatient Medications on File Prior to Visit  Medication Sig Dispense Refill  . amitriptyline (ELAVIL) 50 MG tablet Take 1 tablet (50 mg total) by mouth at bedtime. For nerve pain 90 tablet 3  . ASPIRIN LOW DOSE 81 MG EC tablet TAKE 1 TABLET (81 MG TOTAL) BY MOUTH DAILY. SWALLOW WHOLE. 90 tablet 3  . atorvastatin (LIPITOR) 80 MG tablet TAKE 1 TABLET BY MOUTH EVERY DAY  FOR CHOLESTEROL 90 tablet 2  . BD PEN NEEDLE NANO 2ND GEN 32G X 4 MM MISC USE WITH INSULIN PEN AS DIRECTED 30 each 5  . blood glucose meter kit and supplies KIT Dispense based on patient and insurance preference. Use up to four times daily as directed. (FOR ICD-9 250.00, 250.01). 1 each 0  . Continuous Blood Gluc Receiver (FREESTYLE LIBRE 14 DAY READER) DEVI 1 Device by Does not apply route 3 (three) times daily. 1 each 0  . Continuous Blood Gluc Sensor (FREESTYLE LIBRE 14 DAY SENSOR) MISC USE 3 (THREE) TIMES DAILY AS DIRECTED. CHANGE EVERY 14 DAYS 2 each 28  . gabapentin (NEURONTIN) 300 MG capsule Take 1 capsule (300 mg total) by mouth at bedtime. For nerve pain. 90 capsule 3  . glipiZIDE (GLUCOTROL XL) 10  MG 24 hr tablet TAKE 1 TABLET BY MOUTH EVERY DAY WITH BREAKFAST FOR DIABETES. 90 tablet 3  . insulin glargine (LANTUS SOLOSTAR) 100 UNIT/ML Solostar Pen Inject 25 Units into the skin at bedtime. For diabetes. 15 mL 1  . metFORMIN (GLUCOPHAGE) 1000 MG tablet TAKE 1 TABLET (1,000 MG TOTAL) BY MOUTH 2 (TWO) TIMES DAILY. FOR DIABETES 180 tablet 3  . ONETOUCH VERIO test strip USE 4 TIMES A DAY AS DIRECTED 100 strip 5   No current facility-administered medications on file prior to visit.    Allergies:   Allergies  Allergen Reactions  . Bactrim [Sulfamethoxazole-Trimethoprim] Nausea Only     Physical Exam Today's Vitals   12/30/20 0807  BP: 125/77  Pulse: (!) 101  Weight: 170 lb (77.1 kg)  Height: _0  (1.575 m)   Body mass index is 31.09 kg/m.  General: well developed, well nourished, very pleasant middle-aged female, seated, in no evident distress Head: head normocephalic and atraumatic.   Neck: supple with no carotid or supraclavicular bruits Cardiovascular: regular rate and rhythm, no murmurs Musculoskeletal: no deformity Skin:  no rash/petichiae Vascular:  Normal pulses all extremities   Neurologic Exam Mental Status: Awake and fully alert.   Fluent speech and language.   Oriented to place and time. Recent and remote memory intact. Attention span, concentration and fund of knowledge appropriate. Mood and affect appropriate.  Cranial Nerves: Fundoscopic exam reveals sharp disc margins. Pupils equal, briskly reactive to light. Extraocular movements full without nystagmus. Visual fields full to confrontation. Hearing intact. Facial sensation intact. Face, tongue, palate moves normally and symmetrically.  Motor: Normal bulk and tone. Normal strength in all tested extremity muscles Sensory.: intact to touch , pinprick , position and vibratory sensation.  Subjective numbness left hand and left foot bottom and outer edge Coordination: Rapid alternating movements normal in all extremities. Finger-to-nose and heel-to-shin performed accurately bilaterally. Gait and Station: Arises from chair without difficulty. Stance is normal. Gait demonstrates normal stride length and balance without use of assistive device. Tandem walk and heel toe without difficulty.  Reflexes: 1+ and symmetric. Toes downgoing.     NIHSS  0 Modified Rankin  0     ASSESSMENT: Haley Gomez is a 55 y.o. year old female with hx of right thalamic infarct on 12/19/2018 secondary to small vessel disease with residual left-sided paresthesias.  Recent episode of transient left-sided facial twitching and increased numbness around the left side of her mouth but MRI negative.  Vascular risk factors include HTN, HLD and DM.  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    PLAN:  1. Facial numbness: a. 12/13/2020 evaluated in ED - MRI negative b. Completely resolved and no recurrence c. ?TIA: per patient request, will change aspirin to plavix in setting of possible TIA. Will repeat CTA head/neck as it has been 2 years since completed. dicussed importance of managing stroke risk factors especially with uncontrolled DM but per patient, BGs have been  improving d. ?  Recrudescence of prior stroke symptoms: in setting of upper respiratory infection 2. Right thalamic infarct:  a. Residual deficit: Left hand and foot paresthesias - on amitriptyline and gabapentin per PCP b. Start clopidogrel 75 mg daily  and atorvastatin 80 mg for secondary stroke prevention.  Advised to discontinue aspirin c. Discussed secondary stroke prevention measures and importance of routine follow-up with PCP for aggressive stroke risk factor management d. HTN: BP <130/90.  Well-controlled monitor by PCP e. DM:  A1c <7.  Uncontrolled with recent A1c 12.3. PCP recently made adjustments to DM regimen f. HLD: LDL goal<70.  Recent LDL 43 on atorvastatin 80 mg daily per PCP 3. Right arm and thigh numbness: symptoms present prior to recent ED evaluation - MRI negative. 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  Unknown etiology regarding thigh numbness - possibly type of nerve compression when laying on right side -advised to avoid this position as well as avoidance of tight fitting clothing and use of belts. Also discussed importance of adequate diabetic management and weight loss.  If symptoms worsen or progress, can discuss further evaluation at that time    Follow up in 6 months or call earlier if needed   CC:  [facility] provider: Dr. Hershal Coria, Leticia Penna, NP    I spent 45 minutes of face-to-face and non-face-to-face time with patient.  This included previsit chart review including recent hospitalization pertinent progress notes, lab work and imaging, lab review, study review, order entry, electronic health record documentation, patient education and discussion regarding recent facial numbness and possible etiologies, history of prior stroke and residual deficits, importance of managing stroke risk  factors, right arm and thigh numbness and possible etiologies and answered all other questions to patient satisfaction  Frann Rider, AGNP-BC  [facility] [address] [place], [place] [address]  Phone [phone] Fax [phone] Note: This document was prepared with digital dictation and possible smart phrase technology. Any transcriptional errors that result from this process are unintentional.

## 2020-12-30 NOTE — Patient Instructions (Signed)
Stop aspirin and start clopidogrel 75 mg daily  and continue atorvastatin 80 mg daily for secondary stroke prevention  You will be called to schedule a CTA head/neck for monitoring of narrowing in your head and neck vessels   Continue to follow up with PCP regarding cholesterol, blood pressure and diabetes management  Maintain strict control of hypertension with blood pressure goal below 130/90, diabetes with hemoglobin A1c goal below 7% and cholesterol with LDL cholesterol (bad cholesterol) goal below 70 mg/dL.   We will plan on conservative measures for possible ulnar neuropathy but if symptoms progress or become more consistent, we will discuss further evaluation at that time      Followup in the future with me in 6 months or call earlier if needed       Thank you for [facility]. I hope we have been able to provide you high quality care today.  You may receive a patient satisfaction survey over the next few weeks. We would appreciate your feedback and comments so that we may continue to improve ourselves and the health of our patients.    Transient Ischemic Attack A transient ischemic attack (TIA) causes the same symptoms as a stroke, but the symptoms go away quickly. A TIA happens when blood flow to the brain is blocked. Having a TIA means you may be at risk for a stroke. A TIA is a medical emergency. What are the causes? A TIA is caused by a blocked artery in the head or neck. This means the brain does not get the blood supply it needs. A blockage can be caused by:  Fatty buildup in an artery in the head or neck.  A blood clot.  A tear in an artery.  Irritation and swelling (inflammation) of an artery. Sometimes the cause is not known. What increases the risk? Certain things may make you more likely to have a TIA. Some of these are things that you can change, such as:  Being very overweight.  Using products that have nicotine or  tobacco.  Taking birth control pills.  Not being active.  Drinking too much alcohol.  Using drugs. Health conditions that may increase your risk include:  High blood pressure.  High cholesterol.  Diabetes.  Heart disease.  A heartbeat that is not regular (atrial fibrillation).  Sickle cell disease.  Sleep problems (sleep apnea).  Long-term diseases that cause irritation and swelling.  Problems with blood clotting. Other risk factors include:  Being over the age of 54.  Being female.  Having a family history of stroke.  Having had blood clots, stroke, TIA, or heart attack in the past.  Having a history of high blood pressure when pregnant (preeclampsia).  Very bad headaches (migraines). What are the signs or symptoms? The symptoms of a TIA are like those of a stroke. They can include:  Weakness or loss of feeling in your face, arm, or leg. This often happens on one side of your body.  Trouble walking.  Trouble moving your arms or legs.  Trouble talking or understanding what people are saying.  Problems with how you see.  Feeling dizzy.  Feeling confused.  Loss of balance or coordination.  Feeling like you may vomit (nausea) or you vomit.  Having a very bad headache. If you can, note what time you started to have symptoms. Tell your doctor.   How is this treated? The goal of treatment is to lower the risk for a  stroke. This may include:  Changes to diet and lifestyle, such as getting regular exercise and stopping smoking.  Taking medicines to: ? Thin the blood. ? Lower blood pressure. ? Lower cholesterol.  Treating other health conditions, such as diabetes. If testing shows that an artery in your brain is narrow, your doctor may recommend a procedure to:  Take the blockage out of your artery (carotid endarterectomy).  Open or widen an artery in your neck (carotid angioplasty and stenting). Follow these instructions at  home: Medicines  Take over-the-counter and prescription medicines only as told by your doctor.  If you were told to take aspirin or another medicine to thin your blood, take it exactly as told by your doctor. ? Taking too much of the medicine can cause bleeding. ? Taking too little of the medicine may not work to treat the problem. Eating and drinking  Eat 5 or more servings of fruits and vegetables each day.  Follow instructions from your doctor about your diet. You may need to follow a certain diet to help lower your risk of a stroke. You may need to: ? Eat a diet that is low in fat and salt. ? Eat foods with a lot of fiber. ? Limit carbohydrates and sugar.  If you drink alcohol: ? Limit how much you have to:  0-1 drink a day for women who are not pregnant.  0-2 drinks a day for men. ? Know how much alcohol is in a drink. In the [place] one drink equals one 12 oz bottle of beer ( ), one 5 oz glass of wine ( ), or one 1 oz glass of hard liquor (80mL).   General instructions  Keep a healthy weight.  Try to get at least 30 minutes of exercise on most days.  Get treatment if you have sleep problems.  Do not smoke or use any products that contain nicotine or tobacco. If you need help quitting, ask your doctor.  Do not use drugs.  Keep all follow-up visits. Where to find more information  American Stroke Association: www.stroke.org Get help right away if:  You have chest pain.  You have a heartbeat that is not regular.  You have any signs of a stroke. "BE FAST" is an easy way to remember the main warning signs: ? B - Balance. Dizziness, sudden trouble walking, or loss of balance. ? E - Eyes. Trouble seeing or a change in how you see. ? F - Face. Sudden weakness or loss of feeling of the face. The face or eyelid may droop on one side. ? A - Arms. Weakness or loss of feeling in an arm. This happens all of a sudden and most often on one side of the body. ? S -  Speech. Sudden trouble speaking, slurred speech, or trouble understanding what people say. ? T - Time. Time to call emergency services. Write down what time symptoms started.  You have other signs of a stroke, such as: ? A sudden, very bad headache with no known cause. ? Feeling like you may vomit. ? Vomiting. ? A seizure. These symptoms may be an emergency. Get help right away. Call your local emergency services (911 in the [place]  Do not wait to see if the symptoms will go away.  Do not drive yourself to the hospital. Summary  A transient ischemic attack (TIA) happens when an artery in the head or neck is blocked. This causes the same symptoms as a stroke. The symptoms go away quickly.  A TIA is a medical emergency. Get help right away, even if your symptoms go away.  Having a TIA means that you may be at risk for a stroke. Taking medicines and making diet and lifestyle changes can help to prevent a stroke. This information is not intended to replace advice given to you by your health care provider. Make sure you discuss any questions you have with your health care provider. Document Revised: 04/07/2020 Document Reviewed: 04/07/2020 Elsevier Patient Education  2021 Elsevier Inc.    Cubital Tunnel Syndrome  Cubital tunnel syndrome is a condition that causes pain and weakness of the forearm and hand. It happens when one of the nerves that runs along the inside of the elbow joint (ulnar nerve) becomes irritated. This condition is usually caused by repeated arm motions that are done during sports or work-related activities. What are the causes? This condition may be caused by:  Increased pressure on the ulnar nerve at the elbow, arm, or forearm. This can result from: ? Irritation caused by repeated elbow bending. ? Poorly healed elbow fractures. ? Tumors in the elbow. These are usually noncancerous (benign). ? Scar tissue that develops in the elbow after an injury. ? Bony growths  (spurs) near the ulnar nerve.  Stretching of the nerve due to loose elbow ligaments.  Trauma to the nerve at the elbow. What increases the risk? The following factors may make you more likely to develop this condition:  Doing manual labor that requires frequent bending of the elbow.  Playing sports that include repeated or strenuous throwing motions, such as baseball.  Playing contact sports, such as football or lacrosse.  Not warming up properly before activities.  Having diabetes.  Having an underactive thyroid (hypothyroidism). What are the signs or symptoms? Symptoms of this condition include:  Clumsiness or weakness of the hand.  Tenderness of the inner elbow.  Aching or soreness of the inner elbow, forearm, or fingers, especially the little finger or the ring finger.  Increased pain when forcing the elbow to bend.  Reduced control when throwing objects.  Tingling, numbness, or a burning feeling inside the forearm or in part of the hand or fingers, especially the little finger or the ring finger.  Sharp pains that shoot from the elbow down to the wrist and hand.  The inability to grip or pinch hard. How is this diagnosed? This condition is diagnosed based on:  Your symptoms and medical history. Your health care provider will also ask for details about any injury.  A physical exam. You may also have tests, including:  Electromyogram (EMG). This test measures electrical signals sent by your nerves into the muscles.  Nerve conduction study. This test measures how well electrical signals pass through your nerves.  Imaging tests, such as X-rays, ultrasound, and MRI. These tests check for possible causes of your condition. How is this treated? This condition may be treated by:  Stopping the activities that are causing your symptoms to get worse.  Icing and taking medicines to reduce pain and swelling.  Wearing a splint to prevent your elbow from bending, or  wearing an elbow pad where the ulnar nerve is closest to the skin.  Working with a physical therapist in less severe cases. This may help to: ? Decrease your symptoms. ? Improve the strength and range of motion of your elbow, forearm, and hand. If these treatments do not help, surgery may be needed. Follow these instructions at home: If you have a splint:  Wear the splint as told by your health care provider. Remove it only as told by your health care provider.  Loosen the splint if your fingers tingle, become numb, or turn cold and blue.  Keep the splint clean.  If the splint is not waterproof: ? Do not let it get wet. ? Cover it with a watertight covering when you take a bath or shower. Managing pain, stiffness, and swelling  If directed, put ice on the injured area: ? Put ice in a plastic bag. ? Place a towel between your skin and the bag. ? Leave the ice on for 20 minutes, 2-3 times a day.  Move your fingers often to avoid stiffness and to lessen swelling.  Raise (elevate) the injured area above the level of your heart while you are sitting or lying down.   General instructions  Take over-the-counter and prescription medicines only as told by your health care provider.  Do any exercise or physical therapy as told by your health care provider.  Do not drive or use heavy machinery while taking prescription pain medicine.  If you were given an elbow pad, wear it as told by your health care provider.  Keep all follow-up visits as told by your health care provider. This is important. Contact a health care provider if:  Your symptoms get worse.  Your symptoms do not get better with treatment.  You have new pain.  Your hand on the injured side feels numb or cold. Summary  Cubital tunnel syndrome is a condition that causes pain and weakness of the forearm and hand.  You are more likely to develop this condition if you do work or play sports that involve repeated arm  movements.  This condition is often treated by stopping repetitive activities, applying ice, and using anti-inflammatory medicines.  In rare cases, surgery may be needed. This information is not intended to replace advice given to you by your health care provider. Make sure you discuss any questions you have with your health care provider. Document Revised: 01/29/2018 Document Reviewed: 01/29/2018 Elsevier Patient Education  2021 [employer].

## 2020-12-30 NOTE — Progress Notes (Signed)
I agree with the above plan 

## 2020-12-30 NOTE — Telephone Encounter (Signed)
[employer] auth: [employer] spoke to Carney Bern Ref # [id number] order sent to GI. They will reach out to the patient to schedule.

## 2021-01-10 ENCOUNTER — Other Ambulatory Visit: Payer: Self-pay | Admitting: Primary Care

## 2021-01-10 DIAGNOSIS — M792 Neuralgia and neuritis, unspecified: Secondary | ICD-10-CM

## 2021-03-05 ENCOUNTER — Other Ambulatory Visit: Payer: Self-pay | Admitting: Primary Care

## 2021-03-05 DIAGNOSIS — E1159 Type 2 diabetes mellitus with other circulatory complications: Secondary | ICD-10-CM

## 2021-03-05 NOTE — Telephone Encounter (Signed)
Patient is overdue for diabetes follow up and must be scheduled for continued refills. Will send my chart message for patient to schedule appointment.

## 2021-04-03 ENCOUNTER — Other Ambulatory Visit: Payer: Self-pay | Admitting: Primary Care

## 2021-04-03 DIAGNOSIS — E1159 Type 2 diabetes mellitus with other circulatory complications: Secondary | ICD-10-CM

## 2021-04-03 NOTE — Telephone Encounter (Signed)
Office visit required for further refills.  See my chart message.

## 2021-05-07 ENCOUNTER — Other Ambulatory Visit: Payer: Self-pay | Admitting: Primary Care

## 2021-05-07 DIAGNOSIS — E1159 Type 2 diabetes mellitus with other circulatory complications: Secondary | ICD-10-CM

## 2021-05-07 NOTE — Telephone Encounter (Signed)
Patient last 2 refills states need to be seen in office. She has not made follow up. Called to schedule so that refill can be given to her to last until follow up has been made. Voice mail is full. I will call in 15 day supply with note to call office for follow up for more refills.

## 2021-05-11 ENCOUNTER — Other Ambulatory Visit: Payer: Self-pay | Admitting: Primary Care

## 2021-05-11 DIAGNOSIS — E1159 Type 2 diabetes mellitus with other circulatory complications: Secondary | ICD-10-CM

## 2021-05-13 NOTE — Telephone Encounter (Signed)
Patient is past due for DM follow up.  V/m is full not able to leave message for patient.

## 2021-05-15 NOTE — Telephone Encounter (Signed)
It looks like we've attempted to reach out via phone and mychart regarding notification of office visit needed. Will reject refill request until she can come in for follow up as requested numerous times

## 2021-05-20 ENCOUNTER — Other Ambulatory Visit: Payer: Self-pay | Admitting: Primary Care

## 2021-05-20 DIAGNOSIS — E1159 Type 2 diabetes mellitus with other circulatory complications: Secondary | ICD-10-CM

## 2021-05-21 NOTE — Telephone Encounter (Signed)
Patient has been contacted multiple times to schedule a follow up visit and has not. Will decline refill request until she calls to schedule.

## 2021-05-30 DIAGNOSIS — Z20822 Contact with and (suspected) exposure to covid-19: Secondary | ICD-10-CM | POA: Diagnosis not present

## 2021-06-04 ENCOUNTER — Ambulatory Visit: Payer: BC Managed Care – PPO | Admitting: Primary Care

## 2021-06-04 DIAGNOSIS — E1159 Type 2 diabetes mellitus with other circulatory complications: Secondary | ICD-10-CM

## 2021-06-04 MED ORDER — LANTUS SOLOSTAR 100 UNIT/ML ~~LOC~~ SOPN: 25.0000 [IU] | PEN_INJECTOR | Freq: Every day | SUBCUTANEOUS | 0 refills | Status: DC

## 2021-06-04 NOTE — Telephone Encounter (Signed)
Please send back to me and I will call and make appointment. Think you need to see her in office right?

## 2021-06-04 NOTE — Telephone Encounter (Signed)
Did she not receive a My Chart message regarding our office closing?   She needs an appointment ASAP.  I would be fine with virtual and outside labs, but I'm not confident that she will complete her labs. She's been very tough to get in touch with historically.  Where would she get labs?  Refill(s) sent to pharmacy.

## 2021-06-08 NOTE — Telephone Encounter (Signed)
Called patient have made virtual for f/u she did not want to make lab appointment at this time offered her [place] or any [place]. She will think about it and let you know at virtual. Have made note on appointment desk to remind her.

## 2021-06-09 NOTE — Telephone Encounter (Signed)
She absolutely needs labs, will discuss during visit. Please monitor to ensure she doesn't cancel. This has been a pattern for her previously.

## 2021-06-15 ENCOUNTER — Telehealth: Payer: BC Managed Care – PPO | Admitting: Primary Care

## 2021-06-25 ENCOUNTER — Ambulatory Visit (INDEPENDENT_AMBULATORY_CARE_PROVIDER_SITE_OTHER): Payer: BC Managed Care – PPO | Admitting: Primary Care

## 2021-06-25 ENCOUNTER — Encounter: Payer: Self-pay | Admitting: Primary Care

## 2021-06-25 VITALS — Ht 62.0 in | Wt 170.0 lb

## 2021-06-25 DIAGNOSIS — E1159 Type 2 diabetes mellitus with other circulatory complications: Secondary | ICD-10-CM | POA: Diagnosis not present

## 2021-06-25 NOTE — Assessment & Plan Note (Signed)
Glucose readings appear to be uncontrolled, no recent glucose checks.  Also has been stretching insulin to last, so not taking everyday. She will be transitioning to her employers mail order pharmacy and will send [place] paperwork.   Continue Lantus 25 units nightly, metformin 1000 mg BID, Glipizide XL 10 mg.  Consider adding Trulicity or Ozempic. A1C pending.

## 2021-06-25 NOTE — Progress Notes (Signed)
  Patient ID: Haley Gomez, female    DOB: 08/04/1966, 55 y.o.   MRN: [id number]  Virtual visit completed through [employer], a video enabled telemedicine application. Due to national recommendations of social distancing due to COVID-19, a virtual visit is felt to be most appropriate for this patient at this time. Reviewed limitations, risks, security and privacy concerns of performing a virtual visit and the availability of in person appointments. I also reviewed that there may be a patient responsible charge related to this service. The patient agreed to proceed.   Patient location: home Provider location: [facility], office Persons participating in this virtual visit: patient, provider   If any vitals were documented, they were collected by patient at home unless specified below.    Ht 5' 2" (1.575 m)   Wt 170 lb (77.1 kg)   BMI 31.09 kg/m    CC: Follow up for Diabetes  Subjective:   HPI: Haley Gomez is a 55 y.o. female with a history of CVA, type  2 diabetes, hyperlipidemia presenting on 06/25/2021 for diabetes follow up.   She was advised in January 2022 to follow up 6 weeks later, we have not seen her since.   She has had financial issues due to cost. Her employer wanted her to use their mail order pharmacy but she preferred to use a local pharmacy. Because of this choice she's been paying out of pocket for her medications. She plans on signing up for her employers pharmacy soon, medications will be covered.   Current medications include: Glipizide XL 10 mg, metformin 1000 mg BID, Lantus 25 units HS. She has been trying to stretch her Lantus insulin due to cost, so she hasn't been injecting this daily for about 2 months.   She is checking her blood glucose 1-2 times daily for a while and is getting readings of:  AM fasting 160's-180's.   She has not checked her glucose level in 3 weeks as she's been out of Sensors for her FreeStyle Libre as she cannot  afford them at this time.   Last A1C: 12.3 in January 2022.  Last Eye Exam: Due Last Foot Exam: UTD Pneumonia Vaccination: UTD Urine Microalbumin: UTD, due in February 2023 Statin: atorvastatin   Dietary changes since last visit: She endorses a fair diet.   Exercise: She is doing [employer] videos three days weekly.         Relevant past medical, surgical, family and social history reviewed and updated as indicated. Interim medical history since our last visit reviewed. Allergies and medications reviewed and updated. Outpatient Medications Prior to Visit  Medication Sig Dispense Refill   amitriptyline (ELAVIL) 50 MG tablet Take 1 tablet (50 mg total) by mouth at bedtime. For nerve pain 90 tablet 3   atorvastatin (LIPITOR) 80 MG tablet TAKE 1 TABLET BY MOUTH EVERY DAY FOR CHOLESTEROL 90 tablet 2   BD PEN NEEDLE NANO 2ND GEN 32G X 4 MM MISC USE WITH INSULIN PEN AS DIRECTED 30 each 5   blood glucose meter kit and supplies KIT Dispense based on patient and insurance preference. Use up to four times daily as directed. (FOR ICD-9 250.00, 250.01). 1 each 0   clopidogrel (PLAVIX) 75 MG tablet Take 1 tablet (75 mg total) by mouth daily. 90 tablet 3   Continuous Blood Gluc Receiver (FREESTYLE LIBRE 14 DAY READER) DEVI 1 Device by Does not apply route 3 (three) times daily. 1 each 0   Continuous Blood   Gluc Sensor (FREESTYLE LIBRE 14 DAY SENSOR) MISC USE 3 (THREE) TIMES DAILY AS DIRECTED. CHANGE EVERY 14 DAYS 2 each 28   gabapentin (NEURONTIN) 300 MG capsule Take 1 capsule (300 mg total) by mouth at bedtime. For nerve pain. 90 capsule 3   glipiZIDE (GLUCOTROL XL) 10 MG 24 hr tablet TAKE 1 TABLET BY MOUTH EVERY DAY WITH BREAKFAST for diabetes. **must been seen in office for more refills. 15 tablet 0   insulin glargine (LANTUS SOLOSTAR) 100 UNIT/ML Solostar Pen Inject 25 Units into the skin at bedtime. For diabetes. 15 mL 0   metFORMIN (GLUCOPHAGE) 1000 MG tablet TAKE 1 TABLET (1,000 MG TOTAL) BY  MOUTH 2 (TWO) TIMES DAILY. FOR DIABETES 180 tablet 3   ONETOUCH VERIO test strip USE 4 TIMES A DAY AS DIRECTED 100 strip 5   No facility-administered medications prior to visit.     Per HPI unless specifically indicated in ROS section below Review of Systems  Eyes:  Negative for visual disturbance.  Respiratory:  Negative for shortness of breath.   Cardiovascular:  Negative for chest pain.  Neurological:  Negative for dizziness.  Objective:  Ht 5' 2" (1.575 m)   Wt 170 lb (77.1 kg)   BMI 31.09 kg/m   Wt Readings from Last 3 Encounters:  06/25/21 170 lb (77.1 kg)  12/30/20 170 lb (77.1 kg)  10/26/20 166 lb 3.2 oz (75.4 kg)       Physical exam: Gen: alert, NAD, not ill appearing Pulm: speaks in complete sentences without increased work of breathing Psych: normal mood, normal thought content      Results for orders placed or performed during the hospital encounter of 12/13/20  Protime-INR  Result Value Ref Range   Prothrombin Time 12.2 11.4 - 15.2 seconds   INR 0.9 0.8 - 1.2  APTT  Result Value Ref Range   aPTT 27 24 - 36 seconds  CBC  Result Value Ref Range   WBC 10.8 (H) 4.0 - 10.5 K/uL   RBC 5.30 (H) 3.87 - 5.11 MIL/uL   Hemoglobin 12.8 12.0 - 15.0 g/dL   HCT 40.8 36.0 - 46.0 %   MCV 77.0 (L) 80.0 - 100.0 fL   MCH 24.2 (L) 26.0 - 34.0 pg   MCHC 31.4 30.0 - 36.0 g/dL   RDW 17.2 (H) 11.5 - 15.5 %   Platelets 389 150 - 400 K/uL   nRBC 0.0 0.0 - 0.2 %  Differential  Result Value Ref Range   Neutrophils Relative % 67 %   Neutro Abs 7.3 1.7 - 7.7 K/uL   Lymphocytes Relative 24 %   Lymphs Abs 2.6 0.7 - 4.0 K/uL   Monocytes Relative 5 %   Monocytes Absolute 0.6 0.1 - 1.0 K/uL   Eosinophils Relative 2 %   Eosinophils Absolute 0.2 0.0 - 0.5 K/uL   Basophils Relative 1 %   Basophils Absolute 0.1 0.0 - 0.1 K/uL   Immature Granulocytes 1 %   Abs Immature Granulocytes 0.05 0.00 - 0.07 K/uL  Comprehensive metabolic panel  Result Value Ref Range   Sodium 137 135 -  145 mmol/L   Potassium 3.9 3.5 - 5.1 mmol/L   Chloride 101 98 - 111 mmol/L   CO2 26 22 - 32 mmol/L   Glucose, Bld 118 (H) 70 - 99 mg/dL   BUN 12 6 - 20 mg/dL   Creatinine, Ser 0.53 0.44 - 1.00 mg/dL   Calcium 9.7 8.9 - 10.3 mg/dL   Total Protein 8.2 (  H) 6.5 - 8.1 g/dL   Albumin 4.2 3.5 - 5.0 g/dL   AST 22 15 - 41 U/L   ALT 22 0 - 44 U/L   Alkaline Phosphatase 120 38 - 126 U/L   Total Bilirubin 0.9 0.3 - 1.2 mg/dL   GFR, Estimated >60 >60 mL/min   Anion gap 10 5 - 15  CBG monitoring, ED  Result Value Ref Range   Glucose-Capillary 101 (H) 70 - 99 mg/dL  I-Stat beta hCG blood, ED  Result Value Ref Range   I-stat hCG, quantitative <5.0 <5 mIU/mL   Comment 3          CBG monitoring, ED  Result Value Ref Range   Glucose-Capillary 70 70 - 99 mg/dL   Assessment & Plan:   Problem List Items Addressed This Visit       Endocrine   Type 2 diabetes mellitus (HCC) - Primary    Glucose readings appear to be uncontrolled, no recent glucose checks.  Also has been stretching insulin to last, so not taking everyday. She will be transitioning to her employers mail order pharmacy and will send us paperwork.   Continue Lantus 25 units nightly, metformin 1000 mg BID, Glipizide XL 10 mg.  Consider adding Trulicity or Ozempic. A1C pending.      Relevant Orders   Hemoglobin A1c     No orders of the defined types were placed in this encounter.  Orders Placed This Encounter  Procedures   Hemoglobin A1c    I discussed the assessment and treatment plan with the patient. The patient was provided an opportunity to ask questions and all were answered. The patient agreed with the plan and demonstrated an understanding of the instructions. The patient was advised to call back or seek an in-person evaluation if the symptoms worsen or if the condition fails to improve as anticipated.  Follow up plan:  Go to [facility] to have your A1C collected.  I will be in touch again soon!  Kate Clark,  NP-C   Katherine K Clark, NP   

## 2021-06-25 NOTE — Patient Instructions (Signed)
Go to [facility] to have your A1C collected.  I will be in touch again soon!

## 2021-06-26 ENCOUNTER — Other Ambulatory Visit: Payer: Self-pay | Admitting: Primary Care

## 2021-06-26 DIAGNOSIS — E1159 Type 2 diabetes mellitus with other circulatory complications: Secondary | ICD-10-CM

## 2021-06-28 ENCOUNTER — Other Ambulatory Visit: Payer: Self-pay | Admitting: Primary Care

## 2021-06-28 DIAGNOSIS — E1159 Type 2 diabetes mellitus with other circulatory complications: Secondary | ICD-10-CM

## 2021-06-28 NOTE — Telephone Encounter (Signed)
Patient was seen virtual and orders placed for [facility] draw. Hold to check to make sure she has done.

## 2021-06-29 NOTE — Telephone Encounter (Signed)
Checked 10/4 no results received.

## 2021-07-01 ENCOUNTER — Other Ambulatory Visit: Payer: Self-pay | Admitting: Primary Care

## 2021-07-01 DIAGNOSIS — E1159 Type 2 diabetes mellitus with other circulatory complications: Secondary | ICD-10-CM

## 2021-07-02 NOTE — Telephone Encounter (Signed)
Notes checked still not received labs have sent patient a my chart to follow up.

## 2021-07-06 ENCOUNTER — Other Ambulatory Visit: Payer: Self-pay | Admitting: Primary Care

## 2021-07-06 DIAGNOSIS — E1159 Type 2 diabetes mellitus with other circulatory complications: Secondary | ICD-10-CM

## 2021-07-07 NOTE — Telephone Encounter (Signed)
I have sent another my chart today for patient. Please send this message back to me so that I continue to follow up. Just wanted to let you know were we are on this.

## 2021-07-07 NOTE — Telephone Encounter (Signed)
The lab test says "collected" so she's had this drawn, I'm waiting on results. Need to call [facility] to find out what's going on.

## 2021-07-08 ENCOUNTER — Ambulatory Visit: Payer: BC Managed Care – PPO | Admitting: Adult Health

## 2021-07-08 ENCOUNTER — Encounter: Payer: Self-pay | Admitting: Adult Health

## 2021-07-08 NOTE — Progress Notes (Deleted)
[facility] [address] [place]. [place] [address] [phone]       STROKE FOLLOW UP NOTE  Haley Gomez Date of Birth:  1965-10-03 Medical Record Number:  [id number]   Reason for Referral:  stroke follow up    CHIEF COMPLAINT:  No chief complaint on file.   HPI:  Update 07/08/2021 JM: Returns for 54-monthstroke follow-up.  Overall stable without new stroke/TIA symptoms.  Residual ***.  Remains on amitriptyline and gabapentin with continued benefit of poststroke dysesthesias.  No side effects.  Remains on aspirin and atorvastatin without side effects.  Blood pressure today ***.  Recently seen by PCP with diabetic medication noncompliance due to cost.  She has not yet completed A1c as requested by PCP.  Right arm and thigh numbness ***.         History provided for reference purposes only Update 12/30/2020 JM: Haley Gomez after recent ED evaluation for twitching on the left side of her face and increased numbness around the left side of her mouth that began on 12/13/2020.  She was previously seen in office 02/05/2019 for hospital follow-up regarding right thalamic stroke with recommended 270-monthollow-up but unfortunately has not followed up since that time.  Prior stroke residual deficits of left-sided paresthesias.  MRI negative for acute stroke.  Lab work WNL for patient.  Recent lab work by PCP 09/2020 showed LDL 43 and A1c 12.3 (increased from 9.2).  Reports complete resolution of facial symptoms and no recurrence since 12/13/2020 - reports URI for 2 weeks prior to event - COVID (-) Currently on amitriptyline 50 mg nightly and gabapentin 300 mg nightly per PCP for post stroke paresthesias with benefit - reports continued left hand and bottom of foot symptoms but stable Reports compliance on aspirin 81 mg daily and atorvastatin 80 mg daily -denies related side effects Blood pressure today 125/77 Glucose levels have been improving since medication  adjustments per PCP after elevated A1c  She also reports occasional right arm and right outer thigh numbness/tingling that will awaken her in the middle the night when sleeping on that side and will quickly resolve after she goes on her back or left side. Right arm symptoms from elbow down into forearm and outer hand with pinky and ring finger involvement but again after movement, numbness will quickly resolve. Right thigh symptoms only present when laying on hip - denies lower back or hip pain - symptoms will quickly resolve and do not occur at any other time.  No further concerns at this time   Initial visit 02/05/2019: Haley Gomez a 55.o. female with history of DB and HTN  who presented with L face and arm numbness.  Stroke work-up revealed right thalamic infarct as evidenced on MRI secondary to small vessel disease source.  CTA showed slight proximal right P2 narrowing.  2D echo unremarkable.  LDL 199 -increase atorvastatin dose 20 mg to 80 mg daily and A1c 12.5 -recommended close PCP follow-up at discharge.  HTN stable.  Other stroke risk factors include EtOH use and family history of stroke (father) but no personal history of stroke.  Recommended DAPT for 3 weeks and aspirin alone.  Residual deficits decreased left-sided sensation and was discharged home in stable condition without therapy needs.  She continues to have residual left-sided numbness and paresthesias.  As she is left-hand dominant, this is caused her to have difficulty performing activities such as writing or using a keyboard.  Due to  this, she has not returned to work at this time and is currently receiving short-term disability through her PCP.  PCP initiated gabapentin 100 mg 3 times daily without benefit.  She describes pain as a burning sensation and tightness which worsens with activity.  She continues to participate in PT/OT which she believes has been helping.  Mild sensation of daytime fatigue/mind fogginess  throughout the day.  Completed 3 weeks DAPT and continues on aspirin alone without side effects of bleeding or bruising.  Continues on atorvastatin 80 mg without side effects of myalgias.  Continues to follow with PCP for HTN and DM management.  No further concerns at this time.  Denies new or worsening stroke/TIA symptoms.      ROS:   14 system review of systems performed and negative with exception of numbness and tingling  PMH:  Past Medical History:  Diagnosis Date   Diabetes mellitus without complication ([place])    Hyperlipidemia    Right thalamic infarction ([place])    Stroke ([place])     PSH:  Past Surgical History:  Procedure Laterality Date   CESAREAN SECTION     GALLBLADDER SURGERY  1997    Social History:  Social History   Socioeconomic History   Marital status: Divorced    Spouse name: Not on file   Number of children: Not on file   Years of education: Not on file   Highest education level: Not on file  Occupational History   Not on file  Tobacco Use   Smoking status: Never   Smokeless tobacco: Never  Vaping Use   Vaping Use: Never used  Substance and Sexual Activity   Alcohol use: Yes    Alcohol/week: 0.0 standard drinks    Comment: social   Drug use: Never   Sexual activity: Not on file  Other Topics Concern   Not on file  Social History Narrative   Single.   3 children.   Works as a Marine scientist at [employer]   Enjoys traveling, spending time with friends, exercising, reading.   Social Determinants of Health   Financial Resource Strain: Not on file  Food Insecurity: Not on file  Transportation Needs: Not on file  Physical Activity: Not on file  Stress: Not on file  Social Connections: Not on file  Intimate Partner Violence: Not on file    Family History:  Family History  Problem Relation Age of Onset   Diabetes Mother    Alcohol abuse Father    Hypertension Father    Kidney disease Father    Stroke Father     Medications:   Current Outpatient  Medications on File Prior to Visit  Medication Sig Dispense Refill   amitriptyline (ELAVIL) 50 MG tablet Take 1 tablet (50 mg total) by mouth at bedtime. For nerve pain 90 tablet 3   atorvastatin (LIPITOR) 80 MG tablet TAKE 1 TABLET BY MOUTH EVERY DAY FOR CHOLESTEROL 90 tablet 2   BD PEN NEEDLE NANO 2ND GEN 32G X 4 MM MISC USE WITH INSULIN PEN AS DIRECTED 100 each 1   blood glucose meter kit and supplies KIT Dispense based on patient and insurance preference. Use up to four times daily as directed. (FOR ICD-9 250.00, 250.01). 1 each 0   clopidogrel (PLAVIX) 75 MG tablet Take 1 tablet (75 mg total) by mouth daily. 90 tablet 3   Continuous Blood Gluc Receiver (FREESTYLE LIBRE 14 DAY READER) DEVI 1 Device by Does not apply route 3 (three) times daily. 1  each 0   Continuous Blood Gluc Sensor (FREESTYLE LIBRE 14 DAY SENSOR) MISC USE 3 (THREE) TIMES DAILY AS DIRECTED. CHANGE EVERY 14 DAYS 2 each 28   gabapentin (NEURONTIN) 300 MG capsule Take 1 capsule (300 mg total) by mouth at bedtime. For nerve pain. 90 capsule 3   glipiZIDE (GLUCOTROL XL) 10 MG 24 hr tablet TAKE 1 TAB BY MOUTH DAILY WITH BREAKFAST FOR DIABETES. 90 tablet 1   insulin glargine (LANTUS SOLOSTAR) 100 UNIT/ML Solostar Pen Inject 25 Units into the skin at bedtime. For diabetes. 15 mL 0   metFORMIN (GLUCOPHAGE) 1000 MG tablet TAKE 1 TABLET (1,000 MG TOTAL) BY MOUTH 2 (TWO) TIMES DAILY. FOR DIABETES 180 tablet 3   ONETOUCH VERIO test strip USE 4 TIMES A DAY AS DIRECTED 100 strip 5   No current facility-administered medications on file prior to visit.    Allergies:   Allergies  Allergen Reactions   Bactrim [Sulfamethoxazole-Trimethoprim] Nausea Only     Physical Exam There were no vitals filed for this visit.  There is no height or weight on file to calculate BMI.  General: well developed, well nourished, very pleasant middle-aged female, seated, in no evident distress Head: head normocephalic and atraumatic.   Neck: supple with  no carotid or supraclavicular bruits Cardiovascular: regular rate and rhythm, no murmurs Musculoskeletal: no deformity Skin:  no rash/petichiae Vascular:  Normal pulses all extremities   Neurologic Exam Mental Status: Awake and fully alert.   Fluent speech and language.  Oriented to place and time. Recent and remote memory intact. Attention span, concentration and fund of knowledge appropriate. Mood and affect appropriate.  Cranial Nerves: Pupils equal, briskly reactive to light. Extraocular movements full without nystagmus. Visual fields full to confrontation. Hearing intact. Facial sensation intact. Face, tongue, palate moves normally and symmetrically.  Motor: Normal bulk and tone. Normal strength in all tested extremity muscles Sensory.: intact to touch , pinprick , position and vibratory sensation.  Subjective numbness left hand and left foot bottom and outer edge Coordination: Rapid alternating movements normal in all extremities. Finger-to-nose and heel-to-shin performed accurately bilaterally. Gait and Station: Arises from chair without difficulty. Stance is normal. Gait demonstrates normal stride length and balance without use of assistive device. Tandem walk and heel toe without difficulty.  Reflexes: 1+ and symmetric. Toes downgoing.         ASSESSMENT: Haley Gomez is a 55 y.o. year old female with hx of right thalamic infarct on 12/19/2018 secondary to small vessel disease with residual left-sided paresthesias.  Recent episode of transient left-sided facial twitching and increased numbness around the left side of her mouth but MRI negative.  Vascular risk factors include HTN, HLD and DM.  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    PLAN:  Facial numbness: 12/13/2020 evaluated in ED - MRI negative Completely resolved and no recurrence ?TIA: per patient request, will change aspirin to plavix in setting of possible TIA.   Recommended repeat CTA head/neck not yet completed ***. dicussed importance of managing stroke risk factors especially with uncontrolled DM but per patient, BGs have been improving ?  Recrudescence of prior stroke symptoms: in setting of upper respiratory infection Right thalamic infarct:  Residual deficit: Left hand and foot paresthesias - on amitriptyline and gabapentin per PCP Start clopidogrel 75 mg daily  and atorvastatin 80 mg for secondary stroke prevention.  Advised to discontinue aspirin Discussed secondary stroke prevention measures and importance of routine follow-up with  PCP for aggressive stroke risk factor management HTN: BP <130/90.  Well-controlled monitor by PCP DM: A1c <7.  Uncontrolled with prior A1c 12.3.  Repeat A1c today HLD: LDL goal<70.  Prior LDL 43 on atorvastatin 80 mg daily per PCP.  Repeat lipid panel today Right arm and thigh numbness: symptoms present prior to recent ED evaluation - MRI negative. 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  Unknown etiology regarding thigh numbness - possibly type of nerve compression when laying on right side -advised to avoid this position as well as avoidance of tight fitting clothing and use of belts. Also discussed importance of adequate diabetic management and weight loss.  If symptoms worsen or progress, can discuss further evaluation at that time    Follow up in 6 months or call earlier if needed   CC:  [facility] provider: Dr. Hershal Coria, Leticia Penna, NP    I spent 45 minutes of face-to-face and non-face-to-face time with patient.  This included previsit chart review including recent hospitalization pertinent progress notes, lab work and imaging, lab review, study review, order entry, electronic health record documentation, patient education and discussion  regarding recent facial numbness and possible etiologies, history of prior stroke and residual deficits, importance of managing stroke risk factors, right arm and thigh numbness and possible etiologies and answered all other questions to patient satisfaction  Frann Rider, AGNP-BC  [facility] [address] [place], [place] [address]  Phone [phone] Fax [phone] Note: This document was prepared with digital dictation and possible smart phrase technology. Any transcriptional errors that result from this process are unintentional.

## 2021-07-09 ENCOUNTER — Other Ambulatory Visit: Payer: Self-pay | Admitting: Primary Care

## 2021-07-09 DIAGNOSIS — E1159 Type 2 diabetes mellitus with other circulatory complications: Secondary | ICD-10-CM

## 2021-07-10 NOTE — Telephone Encounter (Signed)
They should be able to see orders right

## 2021-07-16 NOTE — Telephone Encounter (Signed)
Can we please make sure she gets this done?

## 2021-07-16 NOTE — Telephone Encounter (Signed)
[facility] has no results. Patient never went in for her test. It says collected because it was ordered and released into the [facility] system  so the order would be there when the patient came in.

## 2021-07-26 NOTE — Telephone Encounter (Signed)
Called l/m to call office labs still not done

## 2021-07-27 DIAGNOSIS — E1159 Type 2 diabetes mellitus with other circulatory complications: Secondary | ICD-10-CM | POA: Diagnosis not present

## 2021-07-28 LAB — HEMOGLOBIN A1C
Est. average glucose Bld gHb Est-mCnc: 272 mg/dL
Hgb A1c MFr Bld: 11.1 % — ABNORMAL HIGH (ref 4.8–5.6)

## 2021-07-29 DIAGNOSIS — M792 Neuralgia and neuritis, unspecified: Secondary | ICD-10-CM

## 2021-07-29 DIAGNOSIS — E785 Hyperlipidemia, unspecified: Secondary | ICD-10-CM

## 2021-07-29 DIAGNOSIS — E1159 Type 2 diabetes mellitus with other circulatory complications: Secondary | ICD-10-CM

## 2021-08-03 MED ORDER — GABAPENTIN 300 MG PO CAPS: 300.0000 mg | ORAL_CAPSULE | Freq: Every day | ORAL | 0 refills | Status: DC

## 2021-08-03 MED ORDER — ATORVASTATIN CALCIUM 80 MG PO TABS: 80.0000 mg | ORAL_TABLET | Freq: Every day | ORAL | 0 refills | Status: DC

## 2021-08-03 MED ORDER — GLIPIZIDE ER 10 MG PO TB24: ORAL_TABLET | ORAL | 0 refills | Status: DC

## 2021-08-03 MED ORDER — AMITRIPTYLINE HCL 50 MG PO TABS: 50.0000 mg | ORAL_TABLET | Freq: Every day | ORAL | 0 refills | Status: DC

## 2021-08-03 MED ORDER — METFORMIN HCL 1000 MG PO TABS: 1000.0000 mg | ORAL_TABLET | Freq: Two times a day (BID) | ORAL | 0 refills | Status: DC

## 2021-08-03 MED ORDER — LANTUS SOLOSTAR 100 UNIT/ML ~~LOC~~ SOPN: 25.0000 [IU] | PEN_INJECTOR | Freq: Every day | SUBCUTANEOUS | 0 refills | Status: DC

## 2021-08-03 MED ORDER — TRULICITY 0.75 MG/0.5ML ~~LOC~~ SOAJ: 0.7500 mg | SUBCUTANEOUS | 0 refills | Status: DC

## 2021-08-03 MED ORDER — BD PEN NEEDLE NANO 2ND GEN 32G X 4 MM MISC: 0 refills | Status: DC

## 2021-09-14 DIAGNOSIS — E1159 Type 2 diabetes mellitus with other circulatory complications: Secondary | ICD-10-CM

## 2021-09-16 MED ORDER — INSULIN GLARGINE-YFGN 100 UNIT/ML ~~LOC~~ SOPN: 25.0000 [IU] | PEN_INJECTOR | Freq: Every day | SUBCUTANEOUS | 1 refills | Status: DC

## 2021-09-17 ENCOUNTER — Other Ambulatory Visit: Payer: Self-pay | Admitting: Primary Care

## 2021-09-17 DIAGNOSIS — E1159 Type 2 diabetes mellitus with other circulatory complications: Secondary | ICD-10-CM

## 2021-09-17 NOTE — Telephone Encounter (Signed)
Will you double check with [facility] to see if the Semglee insulin made it through? I sent this yesterday and received a refill request for Lantus. The patient mentioned she was taking Semglee.

## 2021-09-21 NOTE — Telephone Encounter (Signed)
Called pharmacy. They have down as Lantus but it was mistake by them they do have the last refill of Semglee that you sent in and will get ready for patient now.

## 2021-09-25 ENCOUNTER — Other Ambulatory Visit: Payer: Self-pay | Admitting: Primary Care

## 2021-09-25 DIAGNOSIS — E1159 Type 2 diabetes mellitus with other circulatory complications: Secondary | ICD-10-CM

## 2021-10-08 DIAGNOSIS — E785 Hyperlipidemia, unspecified: Secondary | ICD-10-CM

## 2021-10-08 DIAGNOSIS — E1159 Type 2 diabetes mellitus with other circulatory complications: Secondary | ICD-10-CM

## 2021-10-08 DIAGNOSIS — M792 Neuralgia and neuritis, unspecified: Secondary | ICD-10-CM

## 2021-10-10 MED ORDER — TRULICITY 0.75 MG/0.5ML ~~LOC~~ SOAJ: 0.7500 mg | SUBCUTANEOUS | 0 refills | Status: DC

## 2021-10-10 MED ORDER — BD PEN NEEDLE NANO 2ND GEN 32G X 4 MM MISC: 0 refills | Status: DC

## 2021-10-10 MED ORDER — ATORVASTATIN CALCIUM 80 MG PO TABS: 80.0000 mg | ORAL_TABLET | Freq: Every day | ORAL | 0 refills | Status: DC

## 2021-10-10 MED ORDER — AMITRIPTYLINE HCL 50 MG PO TABS: 50.0000 mg | ORAL_TABLET | Freq: Every day | ORAL | 0 refills | Status: DC

## 2021-10-10 MED ORDER — GLIPIZIDE ER 10 MG PO TB24: ORAL_TABLET | ORAL | 0 refills | Status: DC

## 2021-10-10 MED ORDER — METFORMIN HCL 1000 MG PO TABS: 1000.0000 mg | ORAL_TABLET | Freq: Two times a day (BID) | ORAL | 0 refills | Status: DC

## 2021-10-10 MED ORDER — GABAPENTIN 300 MG PO CAPS: 300.0000 mg | ORAL_CAPSULE | Freq: Every day | ORAL | 0 refills | Status: DC

## 2021-10-10 MED ORDER — INSULIN GLARGINE-YFGN 100 UNIT/ML ~~LOC~~ SOPN: 25.0000 [IU] | PEN_INJECTOR | Freq: Every day | SUBCUTANEOUS | 0 refills | Status: DC

## 2021-10-23 ENCOUNTER — Other Ambulatory Visit: Payer: Self-pay | Admitting: Primary Care

## 2021-10-23 DIAGNOSIS — E1159 Type 2 diabetes mellitus with other circulatory complications: Secondary | ICD-10-CM

## 2021-11-05 ENCOUNTER — Encounter: Payer: BC Managed Care – PPO | Admitting: Primary Care

## 2021-11-07 ENCOUNTER — Other Ambulatory Visit: Payer: Self-pay | Admitting: Primary Care

## 2021-11-07 DIAGNOSIS — E1159 Type 2 diabetes mellitus with other circulatory complications: Secondary | ICD-10-CM

## 2022-01-05 ENCOUNTER — Other Ambulatory Visit: Payer: Self-pay | Admitting: Primary Care

## 2022-01-05 DIAGNOSIS — M792 Neuralgia and neuritis, unspecified: Secondary | ICD-10-CM

## 2022-01-05 DIAGNOSIS — E785 Hyperlipidemia, unspecified: Secondary | ICD-10-CM

## 2022-01-05 DIAGNOSIS — E1159 Type 2 diabetes mellitus with other circulatory complications: Secondary | ICD-10-CM

## 2022-01-05 NOTE — Telephone Encounter (Signed)
Called patient set her up for 4/21 at 12:20 she could not come in before that. She also wanted you to know she will be out of medications this week and will need a 90 day sent in to mail order.   ?

## 2022-01-05 NOTE — Telephone Encounter (Signed)
Patient is overdue for diabetes follow up. ?Looks like she cancelled CPE for February 2023. ? ?Needs to come in the office ASAP for continued refills.  ?Let me know when she is scheduled. ? ? ?

## 2022-01-06 MED ORDER — INSULIN GLARGINE-YFGN 100 UNIT/ML ~~LOC~~ SOPN: 25.0000 [IU] | PEN_INJECTOR | Freq: Every day | SUBCUTANEOUS | 0 refills | Status: DC

## 2022-01-06 MED ORDER — GLIPIZIDE ER 10 MG PO TB24: ORAL_TABLET | ORAL | 0 refills | Status: DC

## 2022-01-06 MED ORDER — GABAPENTIN 300 MG PO CAPS: 300.0000 mg | ORAL_CAPSULE | Freq: Every day | ORAL | 0 refills | Status: DC

## 2022-01-06 MED ORDER — METFORMIN HCL 1000 MG PO TABS: 1000.0000 mg | ORAL_TABLET | Freq: Two times a day (BID) | ORAL | 0 refills | Status: DC

## 2022-01-06 MED ORDER — AMITRIPTYLINE HCL 50 MG PO TABS: 50.0000 mg | ORAL_TABLET | Freq: Every day | ORAL | 0 refills | Status: DC

## 2022-01-06 MED ORDER — ATORVASTATIN CALCIUM 80 MG PO TABS: 80.0000 mg | ORAL_TABLET | Freq: Every day | ORAL | 0 refills | Status: DC

## 2022-01-06 MED ORDER — BD PEN NEEDLE NANO 2ND GEN 32G X 4 MM MISC: 0 refills | Status: DC

## 2022-01-06 MED ORDER — TRULICITY 0.75 MG/0.5ML ~~LOC~~ SOAJ: 0.7500 mg | SUBCUTANEOUS | 0 refills | Status: DC

## 2022-01-14 ENCOUNTER — Other Ambulatory Visit: Payer: Self-pay | Admitting: Adult Health

## 2022-01-14 ENCOUNTER — Ambulatory Visit: Payer: BC Managed Care – PPO | Admitting: Primary Care

## 2022-01-14 ENCOUNTER — Encounter: Payer: Self-pay | Admitting: Primary Care

## 2022-01-14 VITALS — BP 122/80 | HR 94 | Ht 63.0 in | Wt 163.0 lb

## 2022-01-14 DIAGNOSIS — E1159 Type 2 diabetes mellitus with other circulatory complications: Secondary | ICD-10-CM

## 2022-01-14 DIAGNOSIS — M792 Neuralgia and neuritis, unspecified: Secondary | ICD-10-CM

## 2022-01-14 DIAGNOSIS — Z8673 Personal history of transient ischemic attack (TIA), and cerebral infarction without residual deficits: Secondary | ICD-10-CM | POA: Diagnosis not present

## 2022-01-14 DIAGNOSIS — E785 Hyperlipidemia, unspecified: Secondary | ICD-10-CM

## 2022-01-14 LAB — COMPREHENSIVE METABOLIC PANEL
ALT: 16 U/L (ref 0–35)
AST: 16 U/L (ref 0–37)
Albumin: 4.6 g/dL (ref 3.5–5.2)
Alkaline Phosphatase: 135 U/L — ABNORMAL HIGH (ref 39–117)
BUN: 13 mg/dL (ref 6–23)
CO2: 27 mEq/L (ref 19–32)
Calcium: 10.2 mg/dL (ref 8.4–10.5)
Chloride: 101 mEq/L (ref 96–112)
Creatinine, Ser: 0.62 mg/dL (ref 0.40–1.20)
GFR: 100.78 mL/min (ref >60.00)
Glucose, Bld: 127 mg/dL — ABNORMAL HIGH (ref 70–99)
Potassium: 4.4 mEq/L (ref 3.5–5.1)
Sodium: 141 mEq/L (ref 135–145)
Total Bilirubin: 0.5 mg/dL (ref 0.2–1.2)
Total Protein: 7.8 g/dL (ref 6.0–8.3)

## 2022-01-14 LAB — POCT GLYCOSYLATED HEMOGLOBIN (HGB A1C): Hemoglobin A1C: 7.8 % — AB (ref 4.0–5.6)

## 2022-01-14 LAB — LIPID PANEL
Cholesterol: 150 mg/dL (ref 0–200)
HDL: 32.7 mg/dL — ABNORMAL LOW (ref >39.00)
LDL Cholesterol: 91 mg/dL (ref 0–99)
NonHDL: 117.55
Total CHOL/HDL Ratio: 5
Triglycerides: 133 mg/dL (ref 0.0–149.0)
VLDL: 26.6 mg/dL (ref 0.0–40.0)

## 2022-01-14 LAB — CBC
HCT: 38.2 % (ref 36.0–46.0)
Hemoglobin: 11.8 g/dL — ABNORMAL LOW (ref 12.0–15.0)
MCHC: 30.8 g/dL (ref 30.0–36.0)
MCV: 73.3 fl — ABNORMAL LOW (ref 78.0–100.0)
Platelets: 424 10*3/uL — ABNORMAL HIGH (ref 150.0–400.0)
RBC: 5.21 Mil/uL — ABNORMAL HIGH (ref 3.87–5.11)
RDW: 17.5 % — ABNORMAL HIGH (ref 11.5–15.5)
WBC: 12.7 10*3/uL — ABNORMAL HIGH (ref 4.0–10.5)

## 2022-01-14 MED ORDER — TRULICITY 1.5 MG/0.5ML ~~LOC~~ SOAJ: 1.5000 mg | SUBCUTANEOUS | 1 refills | Status: DC

## 2022-01-14 NOTE — Progress Notes (Signed)
? ?Subjective:  ? ? Patient ID: Haley Gomez, female    DOB: [birth year], 56 y.o.   MRN: [id number] ? ?HPI ? ?Haley Gomez is a very pleasant 56 y.o. female with a history of type 2 diabetes, hyperlipidemia, stroke, hot flashes who presents today for follow-up of chronic conditions ? ?1) Type 2 Diabetes: ? ?Current medications include: Metformin 1000 mg twice daily, glipizide XL 10 mg daily, Trulicity 7.12 mg weekly, Semglee insulin 25 units daily. ? ?She has noticed diarrhea after injecting Trulicity, resolves after a few days.  ? ?Also managed on gabapentin 300 mg at bedtime for nerve pain and amitriptyline 50 mg at bedtime for nerve pain. ? ?She is checking her blood glucose continuously. ? ?She is seeing frequent low glucose readings in the 40's at night and in the morning. She will notice a jittery symptoms.  ? ?During the day glucose readings in the 120's-130's.  ? ?Last A1C: 11.1 in November 2022, 7.8 today ?Last Eye Exam: Due ?Last Foot Exam: Due ?Pneumonia Vaccination: 2016 ?Urine Microalbumin: Due ?Statin: Atorvastatin ? ?Dietary changes since last visit: She is working on limiting pasta. She's eating more fruit and vegetables. She is working to increase protein.  ? ? ?Exercise: No regular exercise  ? ?2) Hyperlipidemia/History of CVA: Currently managed on clopidogrel 75 mg daily, atorvastatin 80 mg daily. ? ?She denies chest pain, dizziness, weakness, slurred speech.  ? ?BP Readings from Last 3 Encounters:  ?01/14/22 122/80  ?12/30/20 125/77  ?12/13/20 (!) 149/86  ? ? ? ?Review of Systems  ?Eyes:  Negative for visual disturbance.  ?Respiratory:  Negative for shortness of breath.   ?Cardiovascular:  Negative for chest pain.  ?Neurological:  Negative for dizziness, speech difficulty, weakness and headaches.  ? ?   ? ? ?Past Medical History:  ?Diagnosis Date  ? Diabetes mellitus without complication ([place])   ? Hyperlipidemia   ? Right thalamic infarction [facility])   ? Stroke [facility])   ? ? ?Social History   ? ?Socioeconomic History  ? Marital status: Divorced  ?  Spouse name: Not on file  ? Number of children: Not on file  ? Years of education: Not on file  ? Highest education level: Not on file  ?Occupational History  ? Not on file  ?Tobacco Use  ? Smoking status: Never  ? Smokeless tobacco: Never  ?Vaping Use  ? Vaping Use: Never used  ?Substance and Sexual Activity  ? Alcohol use: Yes  ?  Alcohol/week: 0.0 standard drinks  ?  Comment: social  ? Drug use: Never  ? Sexual activity: Not on file  ?Other Topics Concern  ? Not on file  ?Social History Narrative  ? Single.  ? 3 children.  ? Works as a Marine scientist at [employer]  ? Enjoys traveling, spending time with friends, exercising, reading.  ? ?Social Determinants of Health  ? ?Financial Resource Strain: Not on file  ?Food Insecurity: Not on file  ?Transportation Needs: Not on file  ?Physical Activity: Not on file  ?Stress: Not on file  ?Social Connections: Not on file  ?Intimate Partner Violence: Not on file  ? ? ?Past Surgical History:  ?Procedure Laterality Date  ? CESAREAN SECTION    ? GALLBLADDER SURGERY  1997  ? ? ?Family History  ?Problem Relation Age of Onset  ? Diabetes Mother   ? Alcohol abuse Father   ? Hypertension Father   ? Kidney disease Father   ? Stroke Father   ? ? ?Allergies  ?  Allergen Reactions  ? Bactrim [Sulfamethoxazole-Trimethoprim] Nausea Only  ? ? ?Current Outpatient Medications on File Prior to Visit  ?Medication Sig Dispense Refill  ? amitriptyline (ELAVIL) 50 MG tablet Take 1 tablet (50 mg total) by mouth at bedtime. For nerve pain. Office visit required for further refills. 90 tablet 0  ? atorvastatin (LIPITOR) 80 MG tablet Take 1 tablet (80 mg total) by mouth daily. For cholesterol. Office visit required for further refills. 90 tablet 0  ? blood glucose meter kit and supplies KIT Dispense based on patient and insurance preference. Use up to four times daily as directed. (FOR ICD-9 250.00, 250.01). 1 each 0  ? clopidogrel (PLAVIX) 75 MG tablet Take  1 tablet (75 mg total) by mouth daily. 90 tablet 3  ? Continuous Blood Gluc Receiver (FREESTYLE LIBRE 14 DAY READER) DEVI 1 Device by Does not apply route 3 (three) times daily. 1 each 0  ? Continuous Blood Gluc Sensor (FREESTYLE LIBRE 14 DAY SENSOR) MISC USE 3 TIMES A DAY AS DIRECTED. CHANGE SENSOR EVERY 14 DAYS 6 each 1  ? Dulaglutide (TRULICITY) 9.56 OZ/3.0QM SOPN Inject 0.75 mg into the skin once a week. For diabetes. Office visit required for further refills. 6 mL 0  ? gabapentin (NEURONTIN) 300 MG capsule Take 1 capsule (300 mg total) by mouth at bedtime. For nerve pain. Office visit required for further refills. 90 capsule 0  ? glipiZIDE (GLUCOTROL XL) 10 MG 24 hr tablet TAKE 1 TAB BY MOUTH DAILY WITH BREAKFAST FOR DIABETES. Office visit required for further refills. 90 tablet 0  ? insulin glargine-yfgn (SEMGLEE) 100 UNIT/ML Pen Inject 25 Units into the skin daily. For diabetes. Office visit required for further refills. 30 mL 0  ? Insulin Pen Needle (BD PEN NEEDLE NANO 2ND GEN) 32G X 4 MM MISC USE WITH INSULIN PEN AS DIRECTED 100 each 0  ? metFORMIN (GLUCOPHAGE) 1000 MG tablet Take 1 tablet (1,000 mg total) by mouth 2 (two) times daily. For diabetes. Office visit required for further refills. 180 tablet 0  ? ONETOUCH VERIO test strip USE 4 TIMES A DAY AS DIRECTED 100 strip 5  ? ?No current facility-administered medications on file prior to visit.  ? ? ?BP 122/80   Pulse 94   Ht _0  (1.6 m)   Wt 163 lb (73.9 kg)   SpO2 98%   BMI 28.87 kg/m?  ?Objective:  ? Physical Exam ?Cardiovascular:  ?   Rate and Rhythm: Normal rate and regular rhythm.  ?Pulmonary:  ?   Effort: Pulmonary effort is normal.  ?   Breath sounds: Normal breath sounds.  ?Musculoskeletal:  ?   Cervical back: Neck supple.  ?Skin: ?   General: Skin is warm and dry.  ?Psychiatric:     ?   Mood and Affect: Mood normal.  ? ? ? ? ? ?   ?Assessment & Plan:  ? ? ? ? ?This visit occurred during the SARS-CoV-2 public health emergency.  Safety  protocols were in place, including screening questions prior to the visit, additional usage of staff PPE, and extensive cleaning of exam room while observing appropriate contact time as indicated for disinfecting solutions.  ?

## 2022-01-14 NOTE — Patient Instructions (Addendum)
We increased the dose of your Trulicity to 1.5 mg weekly. ? ?Inject your Semglee insulin in the morning, continue 25 units. ? ?Continue metformin and glipizide as prescribed. ? ?Please notify me if you continue to see low blood sugars. ? ?Stop by the lab prior to leaving today. I will notify you of your results once received.  ? ?Please schedule a follow up visit for 3 months. ? ?It was a pleasure to see you today! ? ? ?

## 2022-01-14 NOTE — Assessment & Plan Note (Signed)
Repeat lipid panel pending. ?Continue atorvastatin 80 mg daily. ? ?LDL goal of <70. ?

## 2022-01-14 NOTE — Assessment & Plan Note (Signed)
Controlled. ? ?Continue amitriptyline 50 mg daily, gabapentin 300 mg daily. ?

## 2022-01-14 NOTE — Assessment & Plan Note (Signed)
Improved with A1C of 7.8!! ? ?Diarrhea is tolerable with Trulicity. ?Increase Trulicity to 1.5 mg weekly. ? ?Shift Semglee 25 units to AM to prevent lows during the night. ? ?Continue metformin 1000 mg BID, glipizide XL 10 mg daily.  ? ?Foot exam today. ?Urine micro due and pending. ?Managed on statin. ? ?Follow up in 3 months. ?

## 2022-01-14 NOTE — Assessment & Plan Note (Signed)
No new symptoms. ? ?Continue Plavix 75 mg daily, atorvastatin 80 mg daily. ?Repeat lipid panel pending. ?

## 2022-01-15 ENCOUNTER — Other Ambulatory Visit: Payer: Self-pay

## 2022-01-15 DIAGNOSIS — D509 Iron deficiency anemia, unspecified: Secondary | ICD-10-CM

## 2022-01-15 DIAGNOSIS — D72829 Elevated white blood cell count, unspecified: Secondary | ICD-10-CM

## 2022-01-15 MED ORDER — ROSUVASTATIN CALCIUM 40 MG PO TABS: 40.0000 mg | ORAL_TABLET | Freq: Every day | ORAL | 0 refills | Status: DC

## 2022-01-17 ENCOUNTER — Telehealth: Payer: Self-pay | Admitting: Physician Assistant

## 2022-01-17 NOTE — Telephone Encounter (Signed)
Scheduled appt per 4/22 referral. Pt is aware of appt date and time. Pt is aware to arrive 15 mins prior to appt time and to bring and updated insurance card. Pt is aware of appt location.   ?

## 2022-01-17 NOTE — Addendum Note (Signed)
Addended by: Tammi Sou on: 01/17/2022 10:12 AM ? ? Modules accepted: Orders ? ?

## 2022-02-01 NOTE — Progress Notes (Deleted)
[place] [phone]   Fax:([phone] [place] NOTE  Patient Care Team: Pleas Koch, NP as PCP - General (Nurse Practitioner)   CHIEF COMPLAINTS/PURPOSE OF CONSULTATION:  Leukocytosis and microcytic anemia  HISTORY OF PRESENTING ILLNESS:  Haley Gomez 56 y.o. female with medical history significant for ***  On review of the previous records ***  On exam today ***  MEDICAL HISTORY:  Past Medical History:  Diagnosis Date   Diabetes mellitus without complication ([place])    Hyperlipidemia    Right thalamic infarction ([place])    Stroke ([place])     SURGICAL HISTORY: Past Surgical History:  Procedure Laterality Date   CESAREAN SECTION     GALLBLADDER SURGERY  1997    SOCIAL HISTORY: Social History   Socioeconomic History   Marital status: Divorced    Spouse name: Not on file   Number of children: Not on file   Years of education: Not on file   Highest education level: Not on file  Occupational History   Not on file  Tobacco Use   Smoking status: Never   Smokeless tobacco: Never  Vaping Use   Vaping Use: Never used  Substance and Sexual Activity   Alcohol use: Yes    Alcohol/week: 0.0 standard drinks    Comment: social   Drug use: Never   Sexual activity: Not on file  Other Topics Concern   Not on file  Social History Narrative   Single.   3 children.   Works as a Marine scientist at [employer]   Enjoys traveling, spending time with friends, exercising, reading.   Social Determinants of Health   Financial Resource Strain: Not on file  Food Insecurity: Not on file  Transportation Needs: Not on file  Physical Activity: Not on file  Stress: Not on file  Social Connections: Not on file  Intimate Partner Violence: Not on file    FAMILY HISTORY: Family History  Problem Relation Age of Onset   Diabetes Mother    Alcohol abuse Father    Hypertension Father    Kidney disease Father    Stroke Father     ALLERGIES:   is allergic to bactrim [sulfamethoxazole-trimethoprim].  MEDICATIONS:  Current Outpatient Medications  Medication Sig Dispense Refill   amitriptyline (ELAVIL) 50 MG tablet Take 1 tablet (50 mg total) by mouth at bedtime. For nerve pain. Office visit required for further refills. 90 tablet 0   blood glucose meter kit and supplies KIT Dispense based on patient and insurance preference. Use up to four times daily as directed. (FOR ICD-9 250.00, 250.01). 1 each 0   clopidogrel (PLAVIX) 75 MG tablet TAKE 1 TABLET BY MOUTH EVERY DAY 90 tablet 1   Continuous Blood Gluc Receiver (FREESTYLE LIBRE 14 DAY READER) DEVI 1 Device by Does not apply route 3 (three) times daily. 1 each 0   Continuous Blood Gluc Sensor (FREESTYLE LIBRE 14 DAY SENSOR) MISC USE 3 TIMES A DAY AS DIRECTED. CHANGE SENSOR EVERY 14 DAYS 6 each 1   Dulaglutide (TRULICITY) 1.5 XB/9.3JQ SOPN Inject 1.5 mg into the skin once a week. for diabetes. 6 mL 1   gabapentin (NEURONTIN) 300 MG capsule Take 1 capsule (300 mg total) by mouth at bedtime. For nerve pain. Office visit required for further refills. 90 capsule 0   glipiZIDE (GLUCOTROL XL) 10 MG 24 hr tablet TAKE 1 TAB BY MOUTH DAILY WITH BREAKFAST FOR DIABETES. Office visit required for further refills. 90 tablet 0  insulin glargine-yfgn (SEMGLEE) 100 UNIT/ML Pen Inject 25 Units into the skin daily. For diabetes. Office visit required for further refills. 30 mL 0   Insulin Pen Needle (BD PEN NEEDLE NANO 2ND GEN) 32G X 4 MM MISC USE WITH INSULIN PEN AS DIRECTED 100 each 0   metFORMIN (GLUCOPHAGE) 1000 MG tablet Take 1 tablet (1,000 mg total) by mouth 2 (two) times daily. For diabetes. Office visit required for further refills. 180 tablet 0   ONETOUCH VERIO test strip USE 4 TIMES A DAY AS DIRECTED 100 strip 5   rosuvastatin (CRESTOR) 40 MG tablet Take 1 tablet (40 mg total) by mouth daily. 90 tablet 0   No current facility-administered medications for this visit.    REVIEW OF SYSTEMS:    Constitutional: ( - ) fevers, ( - )  chills , ( - ) night sweats Eyes: ( - ) blurriness of vision, ( - ) double vision, ( - ) watery eyes Ears, nose, mouth, throat, and face: ( - ) mucositis, ( - ) sore throat Respiratory: ( - ) cough, ( - ) dyspnea, ( - ) wheezes Cardiovascular: ( - ) palpitation, ( - ) chest discomfort, ( - ) lower extremity swelling Gastrointestinal:  ( - ) nausea, ( - ) heartburn, ( - ) change in bowel habits Skin: ( - ) abnormal skin rashes Lymphatics: ( - ) new lymphadenopathy, ( - ) easy bruising Neurological: ( - ) numbness, ( - ) tingling, ( - ) new weaknesses Behavioral/Psych: ( - ) mood change, ( - ) new changes  All other systems were reviewed with the patient and are negative.  PHYSICAL EXAMINATION: ECOG PERFORMANCE STATUS: {CHL ONC ECOG [id number]}  There were no vitals filed for this visit. There were no vitals filed for this visit.  GENERAL: well appearing *** in NAD  SKIN: skin color, texture, turgor are normal, no rashes or significant lesions EYES: conjunctiva are pink and non-injected, sclera clear OROPHARYNX: no exudate, no erythema; lips, buccal mucosa, and tongue normal  NECK: supple, non-tender LYMPH:  no palpable lymphadenopathy in the cervical, axillary or supraclavicular lymph nodes.  LUNGS: clear to auscultation and percussion with normal breathing effort HEART: regular rate & rhythm and no murmurs and no lower extremity edema ABDOMEN: soft, non-tender, non-distended, normal bowel sounds Musculoskeletal: no cyanosis of digits and no clubbing  PSYCH: alert & oriented x 3, fluent speech NEURO: no focal motor/sensory deficits  LABORATORY DATA:  I have reviewed the data as listed    Latest Ref Rng & Units 01/14/2022    1:01 PM 12/13/2020   10:51 AM 10/26/2020    9:58 AM  CBC  WBC 4.0 - 10.5 K/uL 12.7   10.8   11.5    Hemoglobin 12.0 - 15.0 g/dL 11.8   12.8   11.6    Hematocrit 36.0 - 46.0 % 38.2   40.8   36.8    Platelets 150.0 -  400.0 K/uL 424.0   389   379.0         Latest Ref Rng & Units 01/14/2022    1:01 PM 12/13/2020   10:51 AM 10/26/2020    9:58 AM  CMP  Glucose 70 - 99 mg/dL 127   118   185    BUN 6 - 23 mg/dL _0 Creatinine 0.40 - 1.20 mg/dL 0.62   0.53   0.44    Sodium 135 - 145 mEq/L 141  137   138    Potassium 3.5 - 5.1 mEq/L 4.4   3.9   4.0    Chloride 96 - 112 mEq/L 101   101   100    CO2 19 - 32 mEq/L _0 Calcium 8.4 - 10.5 mg/dL 10.2   9.7   10.0    Total Protein 6.0 - 8.3 g/dL 7.8   8.2   7.4    Total Bilirubin 0.2 - 1.2 mg/dL 0.5   0.9   0.2    Alkaline Phos 39 - 117 U/L 135   120   137    AST 0 - 37 U/L _1 ALT 0 - 35 U/L _2 PATHOLOGY: ***  BLOOD FILM: *** Review of the peripheral blood smear showed normal appearing white cells with neutrophils that were appropriately lobated and granulated. There was no predominance of bi-lobed or hyper-segmented neutrophils appreciated. No Dohle bodies were noted. There was no left shifting, immature forms or blasts noted. Lymphocytes remain normal in size without any predominance of large granular lymphocytes. Red cells show no anisopoikilocytosis, macrocytes , microcytes or polychromasia. There were no schistocytes, target cells, echinocytes, acanthocytes, dacrocytes, or stomatocytes.There was no rouleaux formation, nucleated red cells, or intra-cellular inclusions noted. The platelets are normal in size, shape, and color without any clumping evident.  RADIOGRAPHIC STUDIES: I have personally reviewed the radiological images as listed and agreed with the findings in the report. No results found.  ASSESSMENT & PLAN ***  No orders of the defined types were placed in this encounter.   All questions were answered. The patient knows to call the clinic with any problems, questions or concerns.  I have spent a total of {CHL ONC TIME VISIT - LEXNT:[id number]} minutes of face-to-face and non-face-to-face  time, preparing to see the patient, obtaining and/or reviewing separately obtained history, performing a medically appropriate examination, counseling and educating the patient, ordering medications/tests/procedures, referring and communicating with other health care professionals, documenting clinical information in the electronic health record, independently interpreting results and communicating results to the patient, and care coordination.   Dede Query, PA-C Department of Hematology/Oncology [place] [facility] Phone: [phone]

## 2022-02-02 ENCOUNTER — Inpatient Hospital Stay: Payer: BC Managed Care – PPO | Attending: Physician Assistant | Admitting: Physician Assistant

## 2022-02-02 ENCOUNTER — Inpatient Hospital Stay: Payer: BC Managed Care – PPO

## 2022-02-02 DIAGNOSIS — E1159 Type 2 diabetes mellitus with other circulatory complications: Secondary | ICD-10-CM | POA: Insufficient documentation

## 2022-02-02 DIAGNOSIS — E538 Deficiency of other specified B group vitamins: Secondary | ICD-10-CM | POA: Insufficient documentation

## 2022-02-02 DIAGNOSIS — D509 Iron deficiency anemia, unspecified: Secondary | ICD-10-CM | POA: Insufficient documentation

## 2022-02-02 DIAGNOSIS — D72829 Elevated white blood cell count, unspecified: Secondary | ICD-10-CM | POA: Insufficient documentation

## 2022-02-02 DIAGNOSIS — E559 Vitamin D deficiency, unspecified: Secondary | ICD-10-CM | POA: Insufficient documentation

## 2022-02-07 ENCOUNTER — Telehealth: Payer: Self-pay | Admitting: Hematology and Oncology

## 2022-02-07 NOTE — Telephone Encounter (Signed)
R/s pt's new hem appt. Pt is aware of new appt date and time.  °

## 2022-02-18 ENCOUNTER — Inpatient Hospital Stay: Payer: BC Managed Care – PPO

## 2022-02-18 ENCOUNTER — Inpatient Hospital Stay (HOSPITAL_BASED_OUTPATIENT_CLINIC_OR_DEPARTMENT_OTHER): Payer: BC Managed Care – PPO | Admitting: Hematology and Oncology

## 2022-02-18 ENCOUNTER — Encounter: Payer: Self-pay | Admitting: Hematology and Oncology

## 2022-02-18 ENCOUNTER — Other Ambulatory Visit: Payer: Self-pay

## 2022-02-18 VITALS — BP 130/71 | HR 105 | Temp 98.0°F | Resp 18 | Ht 63.0 in | Wt 166.4 lb

## 2022-02-18 DIAGNOSIS — E559 Vitamin D deficiency, unspecified: Secondary | ICD-10-CM

## 2022-02-18 DIAGNOSIS — D72829 Elevated white blood cell count, unspecified: Secondary | ICD-10-CM

## 2022-02-18 DIAGNOSIS — D509 Iron deficiency anemia, unspecified: Secondary | ICD-10-CM

## 2022-02-18 DIAGNOSIS — E538 Deficiency of other specified B group vitamins: Secondary | ICD-10-CM | POA: Diagnosis not present

## 2022-02-18 DIAGNOSIS — E1159 Type 2 diabetes mellitus with other circulatory complications: Secondary | ICD-10-CM

## 2022-02-18 LAB — CBC WITH DIFFERENTIAL (CANCER CENTER ONLY)
Abs Immature Granulocytes: 0.03 10*3/uL (ref 0.00–0.07)
Basophils Absolute: 0.1 10*3/uL (ref 0.0–0.1)
Basophils Relative: 1 %
Eosinophils Absolute: 0.2 10*3/uL (ref 0.0–0.5)
Eosinophils Relative: 2 %
HCT: 34.4 % — ABNORMAL LOW (ref 36.0–46.0)
Hemoglobin: 10.7 g/dL — ABNORMAL LOW (ref 12.0–15.0)
Immature Granulocytes: 0 %
Lymphocytes Relative: 23 %
Lymphs Abs: 2.5 10*3/uL (ref 0.7–4.0)
MCH: 22.8 pg — ABNORMAL LOW (ref 26.0–34.0)
MCHC: 31.1 g/dL (ref 30.0–36.0)
MCV: 73.3 fL — ABNORMAL LOW (ref 80.0–100.0)
Monocytes Absolute: 0.6 10*3/uL (ref 0.1–1.0)
Monocytes Relative: 5 %
Neutro Abs: 7.2 10*3/uL (ref 1.7–7.7)
Neutrophils Relative %: 69 %
Platelet Count: 436 10*3/uL — ABNORMAL HIGH (ref 150–400)
RBC: 4.69 MIL/uL (ref 3.87–5.11)
RDW: 16.9 % — ABNORMAL HIGH (ref 11.5–15.5)
WBC Count: 10.5 10*3/uL (ref 4.0–10.5)
nRBC: 0 % (ref 0.0–0.2)

## 2022-02-18 LAB — IRON AND IRON BINDING CAPACITY (CC-WL,HP ONLY)
Iron: 29 ug/dL (ref 28–170)
Saturation Ratios: 6 % — ABNORMAL LOW (ref 10.4–31.8)
TIBC: 456 ug/dL — ABNORMAL HIGH (ref 250–450)
UIBC: 427 ug/dL (ref 148–442)

## 2022-02-18 LAB — VITAMIN D 25 HYDROXY (VIT D DEFICIENCY, FRACTURES): Vit D, 25-Hydroxy: 17.64 ng/mL — ABNORMAL LOW (ref 30–100)

## 2022-02-18 LAB — VITAMIN B12: Vitamin B-12: 113 pg/mL — ABNORMAL LOW (ref 180–914)

## 2022-02-18 LAB — SEDIMENTATION RATE: Sed Rate: 26 mm/hr — ABNORMAL HIGH (ref 0–22)

## 2022-02-18 LAB — FERRITIN: Ferritin: 15 ng/mL (ref 11–307)

## 2022-02-18 NOTE — Assessment & Plan Note (Signed)
She is doing better with recent weight loss and aggressive dietary modification We discussed the importance of lifestyle changes while on medication for diabetes She appears motivated to lose weight

## 2022-02-18 NOTE — Assessment & Plan Note (Signed)
She has signs of microcytic anemia I will order iron studies for further evaluation

## 2022-02-18 NOTE — Progress Notes (Signed)
[facility] NOTE  Patient Care Team: Pleas Koch, NP as PCP - General (Nurse Practitioner)  CHIEF COMPLAINTS/PURPOSE OF CONSULTATION:  Chronic intermittent leukocytosis  HISTORY OF PRESENTING ILLNESS:  Haley Gomez 56 y.o. female is here because of elevated WBC.  She was found to have abnormal CBC from routine blood work monitoring by her primary care doctor I have the opportunity to review her electronic records dated back to 2016 Between 06/04/2015 to present time, her white blood cell count fluctuated between 8.3-14.3 She is also noted to have borderline microcytic anemia  She denies recent infection. The last prescription antibiotics was more than 3 months ago There is not reported symptoms of sinus congestion, cough, urinary frequency/urgency or dysuria, diarrhea, joint swelling/pain or abnormal skin rash.  She had no prior history or diagnosis of cancer. Her age appropriate screening programs are up-to-date. The patient has no prior diagnosis of autoimmune disease and was not prescribed corticosteroids related products. She is not a smoker The patient had been diagnosed with diabetes since 2006 and become insulin-dependent since 2020 She had history of stroke, with residual deficit with hypersensitivity on the left side of her body The patient is also noted to have history of vitamin B12 deficiency She denies peripheral neuropathy from diabetes  MEDICAL HISTORY:  Past Medical History:  Diagnosis Date   Diabetes mellitus without complication ([place])    Hyperlipidemia    Right thalamic infarction ([place])    Stroke ([place])     SURGICAL HISTORY: Past Surgical History:  Procedure Laterality Date   CESAREAN SECTION     GALLBLADDER SURGERY  1997    SOCIAL HISTORY: Social History   Socioeconomic History   Marital status: Divorced    Spouse name: Not on file   Number of children: Not on file   Years of education: Not on file   Highest  education level: Not on file  Occupational History   Not on file  Tobacco Use   Smoking status: Never   Smokeless tobacco: Never  Vaping Use   Vaping Use: Never used  Substance and Sexual Activity   Alcohol use: Yes    Alcohol/week: 0.0 standard drinks    Comment: social   Drug use: Never   Sexual activity: Not on file  Other Topics Concern   Not on file  Social History Narrative   Single.   3 children.   Works as a Marine scientist at [employer]   Enjoys traveling, spending time with friends, exercising, reading.   Social Determinants of Health   Financial Resource Strain: Not on file  Food Insecurity: Not on file  Transportation Needs: Not on file  Physical Activity: Not on file  Stress: Not on file  Social Connections: Not on file  Intimate Partner Violence: Not on file    FAMILY HISTORY: Family History  Problem Relation Age of Onset   Diabetes Mother    Alcohol abuse Father    Hypertension Father    Kidney disease Father    Stroke Father     ALLERGIES:  is allergic to bactrim [sulfamethoxazole-trimethoprim].  MEDICATIONS:  Current Outpatient Medications  Medication Sig Dispense Refill   amitriptyline (ELAVIL) 50 MG tablet Take 1 tablet (50 mg total) by mouth at bedtime. For nerve pain. Office visit required for further refills. 90 tablet 0   blood glucose meter kit and supplies KIT Dispense based on patient and insurance preference. Use up to four times daily as directed. (FOR ICD-9 250.00, 250.01). 1  each 0   clopidogrel (PLAVIX) 75 MG tablet TAKE 1 TABLET BY MOUTH EVERY DAY 90 tablet 1   Continuous Blood Gluc Receiver (FREESTYLE LIBRE 14 DAY READER) DEVI 1 Device by Does not apply route 3 (three) times daily. 1 each 0   Continuous Blood Gluc Sensor (FREESTYLE LIBRE 14 DAY SENSOR) MISC USE 3 TIMES A DAY AS DIRECTED. CHANGE SENSOR EVERY 14 DAYS 6 each 1   Dulaglutide (TRULICITY) 1.5 TT/0.1XB SOPN Inject 1.5 mg into the skin once a week. for diabetes. 6 mL 1   gabapentin  (NEURONTIN) 300 MG capsule Take 1 capsule (300 mg total) by mouth at bedtime. For nerve pain. Office visit required for further refills. 90 capsule 0   glipiZIDE (GLUCOTROL XL) 10 MG 24 hr tablet TAKE 1 TAB BY MOUTH DAILY WITH BREAKFAST FOR DIABETES. Office visit required for further refills. 90 tablet 0   insulin glargine-yfgn (SEMGLEE) 100 UNIT/ML Pen Inject 25 Units into the skin daily. For diabetes. Office visit required for further refills. 30 mL 0   Insulin Pen Needle (BD PEN NEEDLE NANO 2ND GEN) 32G X 4 MM MISC USE WITH INSULIN PEN AS DIRECTED 100 each 0   metFORMIN (GLUCOPHAGE) 1000 MG tablet Take 1 tablet (1,000 mg total) by mouth 2 (two) times daily. For diabetes. Office visit required for further refills. 180 tablet 0   ONETOUCH VERIO test strip USE 4 TIMES A DAY AS DIRECTED 100 strip 5   rosuvastatin (CRESTOR) 40 MG tablet Take 1 tablet (40 mg total) by mouth daily. 90 tablet 0   No current facility-administered medications for this visit.    REVIEW OF SYSTEMS:   Constitutional: Denies fevers, chills or abnormal night sweats Eyes: Denies blurriness of vision, double vision or watery eyes Ears, nose, mouth, throat, and face: Denies mucositis or sore throat Respiratory: Denies cough, dyspnea or wheezes Cardiovascular: Denies palpitation, chest discomfort or lower extremity swelling Gastrointestinal:  Denies nausea, heartburn or change in bowel habits Skin: Denies abnormal skin rashes Lymphatics: Denies new lymphadenopathy or easy bruising Neurological:Denies numbness, tingling or new weaknesses Behavioral/Psych: Mood is stable, no new changes  All other systems were reviewed with the patient and are negative.  PHYSICAL EXAMINATION: ECOG PERFORMANCE STATUS: 0 - Asymptomatic  Vitals:   02/18/22 1312  BP: 130/71  Pulse: (!) 105  Resp: 18  Temp: 98 F (36.7 C)  SpO2: 99%   Filed Weights   02/18/22 1312  Weight: 166 lb 6.4 oz (75.5 kg)    GENERAL:alert, no distress and  comfortable SKIN: skin color, texture, turgor are normal, no rashes or significant lesions EYES: normal, conjunctiva are pink and non-injected, sclera clear OROPHARYNX:no exudate, no erythema and lips, buccal mucosa, and tongue normal  NECK: supple, thyroid normal size, non-tender, without nodularity LYMPH:  no palpable lymphadenopathy in the cervical, axillary or inguinal LUNGS: clear to auscultation and percussion with normal breathing effort HEART: regular rate & rhythm and no murmurs and no lower extremity edema ABDOMEN:abdomen soft, non-tender and normal bowel sounds Musculoskeletal:no cyanosis of digits and no clubbing  PSYCH: alert & oriented x 3 with fluent speech NEURO: no focal motor/sensory deficits  LABORATORY DATA:  I have reviewed the data as listed Recent Results (from the past 2160 hour(s))  POCT glycosylated hemoglobin (Hb A1C)     Status: Abnormal   Collection Time: 01/14/22 12:37 PM  Result Value Ref Range   Hemoglobin A1C 7.8 (A) 4.0 - 5.6 %   HbA1c POC (<> result, manual entry)  HbA1c, POC (prediabetic range)     HbA1c, POC (controlled diabetic range)    Lipid panel     Status: Abnormal   Collection Time: 01/14/22  1:01 PM  Result Value Ref Range   Cholesterol 150 0 - 200 mg/dL    Comment: ATP III Classification       Desirable:  < 200 mg/dL               Borderline High:  200 - 239 mg/dL          High:  > = 240 mg/dL   Triglycerides 133.0 0.0 - 149.0 mg/dL    Comment: Normal:  <150 mg/dLBorderline High:  150 - 199 mg/dL   HDL 32.70 (L) >39.00 mg/dL   VLDL 26.6 0.0 - 40.0 mg/dL   LDL Cholesterol 91 0 - 99 mg/dL   Total CHOL/HDL Ratio 5     Comment:                Men          Women1/2 Average Risk     3.4          3.3Average Risk          5.0          4.42X Average Risk          9.6          7.13X Average Risk          15.0          11.0                       NonHDL 117.55     Comment: NOTE:  Non-HDL goal should be 30 mg/dL higher than patient's LDL goal  (i.e. LDL goal of < 70 mg/dL, would have non-HDL goal of < 100 mg/dL)  Comprehensive metabolic panel     Status: Abnormal   Collection Time: 01/14/22  1:01 PM  Result Value Ref Range   Sodium 141 135 - 145 mEq/L   Potassium 4.4 3.5 - 5.1 mEq/L   Chloride 101 96 - 112 mEq/L   CO2 27 19 - 32 mEq/L   Glucose, Bld 127 (H) 70 - 99 mg/dL   BUN 13 6 - 23 mg/dL   Creatinine, Ser 0.62 0.40 - 1.20 mg/dL   Total Bilirubin 0.5 0.2 - 1.2 mg/dL   Alkaline Phosphatase 135 (H) 39 - 117 U/L   AST 16 0 - 37 U/L   ALT 16 0 - 35 U/L   Total Protein 7.8 6.0 - 8.3 g/dL   Albumin 4.6 3.5 - 5.2 g/dL   GFR 100.78 >60.00 mL/min    Comment: Calculated using the CKD-EPI Creatinine Equation (2021)   Calcium 10.2 8.4 - 10.5 mg/dL  CBC     Status: Abnormal   Collection Time: 01/14/22  1:01 PM  Result Value Ref Range   WBC 12.7 (H) 4.0 - 10.5 K/uL   RBC 5.21 (H) 3.87 - 5.11 Mil/uL   Platelets 424.0 (H) 150.0 - 400.0 K/uL   Hemoglobin 11.8 (L) 12.0 - 15.0 g/dL   HCT 38.2 36.0 - 46.0 %   MCV 73.3 (L) 78.0 - 100.0 fl   MCHC 30.8 30.0 - 36.0 g/dL   RDW 17.5 (H) 11.5 - 15.5 %  CBC with Differential (Cancer Center Only)     Status: Abnormal   Collection Time: 02/18/22  1:43 PM  Result Value Ref Range  WBC Count 10.5 4.0 - 10.5 K/uL   RBC 4.69 3.87 - 5.11 MIL/uL   Hemoglobin 10.7 (L) 12.0 - 15.0 g/dL    Comment: Reticulocyte Hemoglobin testing may be clinically indicated, consider ordering this additional test [id number]    HCT 34.4 (L) 36.0 - 46.0 %   MCV 73.3 (L) 80.0 - 100.0 fL   MCH 22.8 (L) 26.0 - 34.0 pg   MCHC 31.1 30.0 - 36.0 g/dL   RDW 16.9 (H) 11.5 - 15.5 %   Platelet Count 436 (H) 150 - 400 K/uL   nRBC 0.0 0.0 - 0.2 %   Neutrophils Relative % 69 %   Neutro Abs 7.2 1.7 - 7.7 K/uL   Lymphocytes Relative 23 %   Lymphs Abs 2.5 0.7 - 4.0 K/uL   Monocytes Relative 5 %   Monocytes Absolute 0.6 0.1 - 1.0 K/uL   Eosinophils Relative 2 %   Eosinophils Absolute 0.2 0.0 - 0.5 K/uL   Basophils  Relative 1 %   Basophils Absolute 0.1 0.0 - 0.1 K/uL   Immature Granulocytes 0 %   Abs Immature Granulocytes 0.03 0.00 - 0.07 K/uL    Comment: Performed at [facility], [address] [place], [place] [address]  Sedimentation rate     Status: Abnormal   Collection Time: 02/18/22  1:43 PM  Result Value Ref Range   Sed Rate 26 (H) 0 - 22 mm/hr    Comment: Performed at [facility], [place] [address], [place], [place] [address]  Iron and Iron Binding Capacity ([facility] only)     Status: Abnormal   Collection Time: 02/18/22  1:43 PM  Result Value Ref Range   Iron 29 28 - 170 ug/dL   TIBC 456 (H) 250 - 450 ug/dL   Saturation Ratios 6 (L) 10.4 - 31.8 %   UIBC 427 148 - 442 ug/dL    Comment: Performed at [facility], [place] [address] [place], [place] [address]   ASSESSMENT & PLAN  Leucocytosis At the time of dictation, her total white count is normal I suspect her chronic intermittent leukocytosis is reactive in nature Patient with obesity and insulin resistance can have abnormally high leukocyte count I plan to see her next week for further follow-up and review of test result  Microcytic anemia She has signs of microcytic anemia I will order iron studies for further evaluation  Vitamin B12 deficiency She has remote history of vitamin B12 deficiency and is not taking vitamin B12 supplement Plan to recheck vitamin B12 level today and will review test result with her next week  Type 2 diabetes mellitus ([place]) She is doing better with recent weight loss and aggressive dietary modification We discussed the importance of lifestyle changes while on medication for diabetes She appears motivated to lose weight  Orders Placed This Encounter  Procedures   Iron and Iron Binding Capacity ([facility] only)    Standing Status:   Future    Number of Occurrences:   1    Standing Expiration Date:   02/19/2023   Ferritin     Standing Status:   Future    Number of Occurrences:   1    Standing Expiration Date:   02/18/2023   Vitamin B12    Standing Status:   Future    Number of Occurrences:   1    Standing Expiration Date:   02/19/2023   Sedimentation rate    Standing Status:  Future    Number of Occurrences:   1    Standing Expiration Date:   02/19/2023   CBC with Differential (Cancer Center Only)    Standing Status:   Future    Number of Occurrences:   1    Standing Expiration Date:   02/19/2023   Vitamin D 25 hydroxy    Standing Status:   Future    Number of Occurrences:   1    Standing Expiration Date:   02/18/2023    All questions were answered. The patient knows to call the clinic with any problems, questions or concerns. I spent 55 minutes counseling the patient face to face. The total time spent in the appointment was 55 minutes and more than 50% was on counseling.     Heath Lark, MD 02/18/2022 3:10 PM

## 2022-02-18 NOTE — Assessment & Plan Note (Signed)
She has remote history of vitamin B12 deficiency and is not taking vitamin B12 supplement Plan to recheck vitamin B12 level today and will review test result with her next week

## 2022-02-18 NOTE — Assessment & Plan Note (Signed)
At the time of dictation, her total white count is normal I suspect her chronic intermittent leukocytosis is reactive in nature Patient with obesity and insulin resistance can have abnormally high leukocyte count I plan to see her next week for further follow-up and review of test result

## 2022-02-22 ENCOUNTER — Other Ambulatory Visit: Payer: Self-pay | Admitting: Hematology and Oncology

## 2022-02-25 ENCOUNTER — Encounter: Payer: Self-pay | Admitting: Hematology and Oncology

## 2022-02-25 ENCOUNTER — Inpatient Hospital Stay: Payer: BC Managed Care – PPO | Attending: Physician Assistant | Admitting: Hematology and Oncology

## 2022-02-25 ENCOUNTER — Inpatient Hospital Stay: Payer: BC Managed Care – PPO

## 2022-02-25 DIAGNOSIS — E559 Vitamin D deficiency, unspecified: Secondary | ICD-10-CM

## 2022-02-25 DIAGNOSIS — E538 Deficiency of other specified B group vitamins: Secondary | ICD-10-CM | POA: Insufficient documentation

## 2022-02-25 DIAGNOSIS — D509 Iron deficiency anemia, unspecified: Secondary | ICD-10-CM | POA: Diagnosis not present

## 2022-02-25 DIAGNOSIS — D72829 Elevated white blood cell count, unspecified: Secondary | ICD-10-CM | POA: Diagnosis not present

## 2022-02-25 MED ORDER — CYANOCOBALAMIN 1000 MCG/ML IJ SOLN
1000.0000 ug | Freq: Once | INTRAMUSCULAR | Status: AC
  Administered 2022-02-25: 1000 ug via INTRAMUSCULAR
  Filled 2022-02-25: qty 1

## 2022-02-25 NOTE — Assessment & Plan Note (Signed)
She has come from iron deficiency She has never seen GI or had colonoscopy I recommend GI referral I recommend oral iron supplement She is in agreement

## 2022-02-25 NOTE — Assessment & Plan Note (Signed)
She has severe vitamin D deficiency I recommend high-dose vitamin D supplement 5000 units daily

## 2022-02-25 NOTE — Assessment & Plan Note (Signed)
She has severe vitamin B12 deficiency, could be related to metformin I recommend high-dose vitamin B12 replacement weekly x4 and then monthly She is in agreement

## 2022-02-25 NOTE — Progress Notes (Signed)
[facility] OFFICE PROGRESS NOTE  Pleas Koch, NP  ASSESSMENT & PLAN:  Leucocytosis This has resolved This is likely reactive in nature  Vitamin B12 deficiency She has severe vitamin B12 deficiency, could be related to metformin I recommend high-dose vitamin B12 replacement weekly x4 and then monthly She is in agreement  Microcytic anemia She has come from iron deficiency She has never seen GI or had colonoscopy I recommend GI referral I recommend oral iron supplement She is in agreement  Vitamin D deficiency She has severe vitamin D deficiency I recommend high-dose vitamin D supplement 5000 units daily  Orders Placed This Encounter  Procedures   VITAMIN D 25 Hydroxy (Vit-D Deficiency, Fractures)    Standing Status:   Future    Standing Expiration Date:   02/26/2023   Vitamin B12    Standing Status:   Future    Standing Expiration Date:   02/26/2023   Iron and Iron Binding Capacity ([facility] only)    Standing Status:   Future    Standing Expiration Date:   02/26/2023   Ferritin    Standing Status:   Future    Standing Expiration Date:   02/25/2023   CBC with Differential (Cancer Center Only)    Standing Status:   Future    Standing Expiration Date:   02/26/2023   Ambulatory referral to Gastroenterology    Referral Priority:   Routine    Referral Type:   Consultation    Referral Reason:   Specialty Services Required    Number of Visits Requested:   1    The total time spent in the appointment was 30 minutes encounter with patients including review of chart and various tests results, discussions about plan of care and coordination of care plan   All questions were answered. The patient knows to call the clinic with any problems, questions or concerns. No barriers to learning was detected.    Heath Lark, MD 6/2/20233:25 PM  INTERVAL HISTORY: Haley Gomez 56 y.o. female returns for follow-up She was initially referred here for  leukocytosis She was subsequently found to have vitamin B12 deficiency, vitamin D deficiency and iron deficiency The patient denies any recent signs or symptoms of bleeding such as spontaneous epistaxis, hematuria or hematochezia. She has never had colonoscopy before  SUMMARY OF HEMATOLOGIC HISTORY:  She was found to have abnormal CBC from routine blood work monitoring by her primary care doctor I have the opportunity to review her electronic records dated back to 2016 Between 06/04/2015 to present time, her white blood cell count fluctuated between 8.3-14.3 She is also noted to have borderline microcytic anemia  She denies recent infection. The last prescription antibiotics was more than 3 months ago There is not reported symptoms of sinus congestion, cough, urinary frequency/urgency or dysuria, diarrhea, joint swelling/pain or abnormal skin rash.  She had no prior history or diagnosis of cancer. Her age appropriate screening programs are up-to-date. The patient has no prior diagnosis of autoimmune disease and was not prescribed corticosteroids related products. She is not a smoker The patient had been diagnosed with diabetes since 2006 and become insulin-dependent since 2020 She had history of stroke, with residual deficit with hypersensitivity on the left side of her body The patient is also noted to have history of vitamin B12 deficiency She denies peripheral neuropathy from diabetes  I have reviewed the past medical history, past surgical history, social history and family history with the patient and they  are unchanged from previous note.  ALLERGIES:  is allergic to bactrim [sulfamethoxazole-trimethoprim].  MEDICATIONS:  Current Outpatient Medications  Medication Sig Dispense Refill   amitriptyline (ELAVIL) 50 MG tablet Take 1 tablet (50 mg total) by mouth at bedtime. For nerve pain. Office visit required for further refills. 90 tablet 0   blood glucose meter kit and supplies KIT  Dispense based on patient and insurance preference. Use up to four times daily as directed. (FOR ICD-9 250.00, 250.01). 1 each 0   clopidogrel (PLAVIX) 75 MG tablet TAKE 1 TABLET BY MOUTH EVERY DAY 90 tablet 1   Continuous Blood Gluc Receiver (FREESTYLE LIBRE 14 DAY READER) DEVI 1 Device by Does not apply route 3 (three) times daily. 1 each 0   Continuous Blood Gluc Sensor (FREESTYLE LIBRE 14 DAY SENSOR) MISC USE 3 TIMES A DAY AS DIRECTED. CHANGE SENSOR EVERY 14 DAYS 6 each 1   Dulaglutide (TRULICITY) 1.5 JH/4.1DE SOPN Inject 1.5 mg into the skin once a week. for diabetes. 6 mL 1   gabapentin (NEURONTIN) 300 MG capsule Take 1 capsule (300 mg total) by mouth at bedtime. For nerve pain. Office visit required for further refills. 90 capsule 0   glipiZIDE (GLUCOTROL XL) 10 MG 24 hr tablet TAKE 1 TAB BY MOUTH DAILY WITH BREAKFAST FOR DIABETES. Office visit required for further refills. 90 tablet 0   insulin glargine-yfgn (SEMGLEE) 100 UNIT/ML Pen Inject 25 Units into the skin daily. For diabetes. Office visit required for further refills. 30 mL 0   Insulin Pen Needle (BD PEN NEEDLE NANO 2ND GEN) 32G X 4 MM MISC USE WITH INSULIN PEN AS DIRECTED 100 each 0   metFORMIN (GLUCOPHAGE) 1000 MG tablet Take 1 tablet (1,000 mg total) by mouth 2 (two) times daily. For diabetes. Office visit required for further refills. 180 tablet 0   ONETOUCH VERIO test strip USE 4 TIMES A DAY AS DIRECTED 100 strip 5   rosuvastatin (CRESTOR) 40 MG tablet Take 1 tablet (40 mg total) by mouth daily. 90 tablet 0   No current facility-administered medications for this visit.   Facility-Administered Medications Ordered in Other Visits  Medication Dose Route Frequency Provider Last Rate Last Admin   cyanocobalamin ((VITAMIN B-12)) injection 1,000 mcg  1,000 mcg Intramuscular Once Alvy Bimler, Anetra Czerwinski, MD         REVIEW OF SYSTEMS:   Constitutional: Denies fevers, chills or night sweats Eyes: Denies blurriness of vision Ears, nose, mouth,  throat, and face: Denies mucositis or sore throat Respiratory: Denies cough, dyspnea or wheezes Cardiovascular: Denies palpitation, chest discomfort or lower extremity swelling Gastrointestinal:  Denies nausea, heartburn or change in bowel habits Skin: Denies abnormal skin rashes Lymphatics: Denies new lymphadenopathy or easy bruising Neurological:Denies numbness, tingling or new weaknesses Behavioral/Psych: Mood is stable, no new changes  All other systems were reviewed with the patient and are negative.  PHYSICAL EXAMINATION: ECOG PERFORMANCE STATUS: 0 - Asymptomatic   GENERAL:alert, no distress and comfortable NEURO: alert & oriented x 3 with fluent speech, no focal motor/sensory deficits  LABORATORY DATA:  I have reviewed the data as listed     Component Value Date/Time   NA 141 01/14/2022 1301   K 4.4 01/14/2022 1301   CL 101 01/14/2022 1301   CO2 27 01/14/2022 1301   GLUCOSE 127 (H) 01/14/2022 1301   BUN 13 01/14/2022 1301   CREATININE 0.62 01/14/2022 1301   CALCIUM 10.2 01/14/2022 1301   PROT 7.8 01/14/2022 1301   ALBUMIN 4.6 01/14/2022 1301  AST 16 01/14/2022 1301   ALT 16 01/14/2022 1301   ALKPHOS 135 (H) 01/14/2022 1301   BILITOT 0.5 01/14/2022 1301   GFRNONAA >60 12/13/2020 1051   GFRAA >60 12/20/2018 0212    No results found for: SPEP, UPEP  Lab Results  Component Value Date   WBC 10.5 02/18/2022   NEUTROABS 7.2 02/18/2022   HGB 10.7 (L) 02/18/2022   HCT 34.4 (L) 02/18/2022   MCV 73.3 (L) 02/18/2022   PLT 436 (H) 02/18/2022      Chemistry      Component Value Date/Time   NA 141 01/14/2022 1301   K 4.4 01/14/2022 1301   CL 101 01/14/2022 1301   CO2 27 01/14/2022 1301   BUN 13 01/14/2022 1301   CREATININE 0.62 01/14/2022 1301      Component Value Date/Time   CALCIUM 10.2 01/14/2022 1301   ALKPHOS 135 (H) 01/14/2022 1301   AST 16 01/14/2022 1301   ALT 16 01/14/2022 1301   BILITOT 0.5 01/14/2022 1301

## 2022-02-25 NOTE — Assessment & Plan Note (Signed)
This has resolved This is likely reactive in nature

## 2022-03-04 ENCOUNTER — Inpatient Hospital Stay: Payer: BC Managed Care – PPO

## 2022-03-04 DIAGNOSIS — E538 Deficiency of other specified B group vitamins: Secondary | ICD-10-CM | POA: Diagnosis not present

## 2022-03-04 MED ORDER — CYANOCOBALAMIN 1000 MCG/ML IJ SOLN
1000.0000 ug | Freq: Once | INTRAMUSCULAR | Status: AC
  Administered 2022-03-04: 1000 ug via INTRAMUSCULAR
  Filled 2022-03-04: qty 1

## 2022-03-11 ENCOUNTER — Inpatient Hospital Stay: Payer: BC Managed Care – PPO

## 2022-03-11 ENCOUNTER — Other Ambulatory Visit: Payer: Self-pay | Admitting: Primary Care

## 2022-03-11 DIAGNOSIS — E785 Hyperlipidemia, unspecified: Secondary | ICD-10-CM

## 2022-03-12 ENCOUNTER — Other Ambulatory Visit: Payer: Self-pay

## 2022-03-12 ENCOUNTER — Inpatient Hospital Stay: Payer: BC Managed Care – PPO

## 2022-03-12 VITALS — BP 145/79 | HR 94 | Temp 97.7°F

## 2022-03-12 DIAGNOSIS — E538 Deficiency of other specified B group vitamins: Secondary | ICD-10-CM

## 2022-03-12 MED ORDER — CYANOCOBALAMIN 1000 MCG/ML IJ SOLN
1000.0000 ug | Freq: Once | INTRAMUSCULAR | Status: AC
  Administered 2022-03-12: 1000 ug via INTRAMUSCULAR

## 2022-03-12 NOTE — Progress Notes (Signed)
Patient came in for b12 injection today. There was not a barcode for me to scan of the patient, but the patient was identified using name and birthday.

## 2022-03-12 NOTE — Patient Instructions (Signed)
Vitamin B12 Injection What is this medication? Vitamin B12 (VAHY tuh min B12) prevents and treats low vitamin B12 levels in your body. It is used in people who do not get enough vitamin B12 from their diet or when their digestive tract does not absorb enough. Vitamin B12 plays an important role in maintaining the health of your nervous system and red blood cells. This medicine may be used for other purposes; ask your health care provider or pharmacist if you have questions. COMMON BRAND NAME(S): B-12 Compliance Kit, B-12 Injection Kit, Cyomin, Dodex, LA-12, Nutri-Twelve, Physicians EZ Use B-12, Primabalt What should I tell my care team before I take this medication? They need to know if you have any of these conditions: Kidney disease Leber's disease Megaloblastic anemia An unusual or allergic reaction to cyanocobalamin, cobalt, other medications, foods, dyes, or preservatives Pregnant or trying to get pregnant Breast-feeding How should I use this medication? This medication is injected into a muscle or deeply under the skin. It is usually given in a clinic or care team's office. However, your care team may teach you how to inject yourself. Follow all instructions. Talk to your care team about the use of this medication in children. Special care may be needed. Overdosage: If you think you have taken too much of this medicine contact a poison control center or emergency room at once. NOTE: This medicine is only for you. Do not share this medicine with others. What if I miss a dose? If you are given your dose at a clinic or care team's office, call to reschedule your appointment. If you give your own injections, and you miss a dose, take it as soon as you can. If it is almost time for your next dose, take only that dose. Do not take double or extra doses. What may interact with this medication? Alcohol Colchicine This list may not describe all possible interactions. Give your health care  provider a list of all the medicines, herbs, non-prescription drugs, or dietary supplements you use. Also tell them if you smoke, drink alcohol, or use illegal drugs. Some items may interact with your medicine. What should I watch for while using this medication? Visit your care team regularly. You may need blood work done while you are taking this medication. You may need to follow a special diet. Talk to your care team. Limit your alcohol intake and avoid smoking to get the best benefit. What side effects may I notice from receiving this medication? Side effects that you should report to your care team as soon as possible: Allergic reactions--skin rash, itching, hives, swelling of the face, lips, tongue, or throat Swelling of the ankles, hands, or feet Trouble breathing Side effects that usually do not require medical attention (report to your care team if they continue or are bothersome): Diarrhea This list may not describe all possible side effects. Call your doctor for medical advice about side effects. You may report side effects to FDA at 1-800-FDA-1088. Where should I keep my medication? Keep out of the reach of children. Store at room temperature between 15 and 30 degrees C (59 and 85 degrees F). Protect from light. Throw away any unused medication after the expiration date. NOTE: This sheet is a summary. It may not cover all possible information. If you have questions about this medicine, talk to your doctor, pharmacist, or health care provider.  2023 Elsevier/Gold Standard (2021-05-25 00:00:00)

## 2022-03-15 ENCOUNTER — Other Ambulatory Visit: Payer: Self-pay | Admitting: Primary Care

## 2022-03-15 DIAGNOSIS — E785 Hyperlipidemia, unspecified: Secondary | ICD-10-CM

## 2022-03-17 ENCOUNTER — Other Ambulatory Visit: Payer: Self-pay | Admitting: Primary Care

## 2022-03-17 DIAGNOSIS — E1159 Type 2 diabetes mellitus with other circulatory complications: Secondary | ICD-10-CM

## 2022-03-17 DIAGNOSIS — M792 Neuralgia and neuritis, unspecified: Secondary | ICD-10-CM

## 2022-03-18 ENCOUNTER — Inpatient Hospital Stay: Payer: BC Managed Care – PPO

## 2022-03-18 ENCOUNTER — Other Ambulatory Visit: Payer: Self-pay | Admitting: Primary Care

## 2022-03-18 DIAGNOSIS — M792 Neuralgia and neuritis, unspecified: Secondary | ICD-10-CM

## 2022-03-18 DIAGNOSIS — E1159 Type 2 diabetes mellitus with other circulatory complications: Secondary | ICD-10-CM

## 2022-03-23 ENCOUNTER — Other Ambulatory Visit: Payer: Self-pay | Admitting: Primary Care

## 2022-03-23 ENCOUNTER — Other Ambulatory Visit: Payer: Self-pay

## 2022-03-23 DIAGNOSIS — E1159 Type 2 diabetes mellitus with other circulatory complications: Secondary | ICD-10-CM

## 2022-04-15 ENCOUNTER — Ambulatory Visit: Payer: BC Managed Care – PPO | Admitting: Primary Care

## 2022-04-15 ENCOUNTER — Inpatient Hospital Stay: Payer: BC Managed Care – PPO | Attending: Physician Assistant

## 2022-05-06 ENCOUNTER — Ambulatory Visit: Payer: BC Managed Care – PPO | Admitting: Primary Care

## 2022-05-13 ENCOUNTER — Inpatient Hospital Stay: Payer: BC Managed Care – PPO | Attending: Physician Assistant

## 2022-05-25 DIAGNOSIS — Z111 Encounter for screening for respiratory tuberculosis: Secondary | ICD-10-CM | POA: Diagnosis not present

## 2022-05-28 DIAGNOSIS — Z111 Encounter for screening for respiratory tuberculosis: Secondary | ICD-10-CM | POA: Diagnosis not present

## 2022-05-28 DIAGNOSIS — Z681 Body mass index (BMI) 19 or less, adult: Secondary | ICD-10-CM | POA: Diagnosis not present

## 2022-06-03 DIAGNOSIS — Z111 Encounter for screening for respiratory tuberculosis: Secondary | ICD-10-CM | POA: Diagnosis not present

## 2022-06-05 DIAGNOSIS — Z111 Encounter for screening for respiratory tuberculosis: Secondary | ICD-10-CM | POA: Diagnosis not present

## 2022-06-05 DIAGNOSIS — Z681 Body mass index (BMI) 19 or less, adult: Secondary | ICD-10-CM | POA: Diagnosis not present

## 2022-06-10 ENCOUNTER — Inpatient Hospital Stay: Payer: BC Managed Care – PPO

## 2022-06-10 ENCOUNTER — Inpatient Hospital Stay: Payer: BC Managed Care – PPO | Attending: Physician Assistant | Admitting: Hematology and Oncology

## 2022-07-01 ENCOUNTER — Encounter: Payer: Self-pay | Admitting: Primary Care

## 2022-07-01 ENCOUNTER — Ambulatory Visit (INDEPENDENT_AMBULATORY_CARE_PROVIDER_SITE_OTHER): Payer: BC Managed Care – PPO | Admitting: Primary Care

## 2022-07-01 ENCOUNTER — Other Ambulatory Visit: Payer: Self-pay | Admitting: Primary Care

## 2022-07-01 VITALS — BP 124/78 | HR 105 | Temp 97.3°F | Ht 61.5 in | Wt 165.0 lb

## 2022-07-01 DIAGNOSIS — E1159 Type 2 diabetes mellitus with other circulatory complications: Secondary | ICD-10-CM

## 2022-07-01 DIAGNOSIS — E785 Hyperlipidemia, unspecified: Secondary | ICD-10-CM | POA: Diagnosis not present

## 2022-07-01 DIAGNOSIS — Z0000 Encounter for general adult medical examination without abnormal findings: Secondary | ICD-10-CM | POA: Diagnosis not present

## 2022-07-01 DIAGNOSIS — K219 Gastro-esophageal reflux disease without esophagitis: Secondary | ICD-10-CM

## 2022-07-01 DIAGNOSIS — D72829 Elevated white blood cell count, unspecified: Secondary | ICD-10-CM

## 2022-07-01 DIAGNOSIS — Z23 Encounter for immunization: Secondary | ICD-10-CM | POA: Diagnosis not present

## 2022-07-01 DIAGNOSIS — E559 Vitamin D deficiency, unspecified: Secondary | ICD-10-CM

## 2022-07-01 DIAGNOSIS — D509 Iron deficiency anemia, unspecified: Secondary | ICD-10-CM

## 2022-07-01 DIAGNOSIS — E538 Deficiency of other specified B group vitamins: Secondary | ICD-10-CM

## 2022-07-01 DIAGNOSIS — Z8673 Personal history of transient ischemic attack (TIA), and cerebral infarction without residual deficits: Secondary | ICD-10-CM

## 2022-07-01 DIAGNOSIS — M792 Neuralgia and neuritis, unspecified: Secondary | ICD-10-CM

## 2022-07-01 LAB — MICROALBUMIN / CREATININE URINE RATIO
Creatinine,U: 89 mg/dL
Microalb Creat Ratio: 40 mg/g — ABNORMAL HIGH (ref 0.0–30.0)
Microalb, Ur: 35.6 mg/dL — ABNORMAL HIGH (ref 0.0–1.9)

## 2022-07-01 LAB — HEMOGLOBIN A1C: Hgb A1c MFr Bld: 10.7 % — ABNORMAL HIGH (ref 4.6–6.5)

## 2022-07-01 LAB — COMPREHENSIVE METABOLIC PANEL
ALT: 12 U/L (ref 0–35)
AST: 16 U/L (ref 0–37)
Albumin: 4.1 g/dL (ref 3.5–5.2)
Alkaline Phosphatase: 109 U/L (ref 39–117)
BUN: 14 mg/dL (ref 6–23)
CO2: 27 mEq/L (ref 19–32)
Calcium: 9.7 mg/dL (ref 8.4–10.5)
Chloride: 104 mEq/L (ref 96–112)
Creatinine, Ser: 1 mg/dL (ref 0.40–1.20)
GFR: 63.59 mL/min (ref >60.00)
Glucose, Bld: 86 mg/dL (ref 70–99)
Potassium: 4.1 mEq/L (ref 3.5–5.1)
Sodium: 140 mEq/L (ref 135–145)
Total Bilirubin: 0.3 mg/dL (ref 0.2–1.2)
Total Protein: 7.3 g/dL (ref 6.0–8.3)

## 2022-07-01 LAB — VITAMIN D 25 HYDROXY (VIT D DEFICIENCY, FRACTURES): VITD: 12.36 ng/mL — ABNORMAL LOW (ref 30.00–100.00)

## 2022-07-01 LAB — CBC
HCT: 33.6 % — ABNORMAL LOW (ref 36.0–46.0)
Hemoglobin: 10.5 g/dL — ABNORMAL LOW (ref 12.0–15.0)
MCHC: 31.4 g/dL (ref 30.0–36.0)
MCV: 73.4 fl — ABNORMAL LOW (ref 78.0–100.0)
Platelets: 374 10*3/uL (ref 150.0–400.0)
RBC: 4.58 Mil/uL (ref 3.87–5.11)
RDW: 17 % — ABNORMAL HIGH (ref 11.5–15.5)
WBC: 8.4 10*3/uL (ref 4.0–10.5)

## 2022-07-01 LAB — LIPID PANEL
Cholesterol: 89 mg/dL (ref 0–200)
HDL: 30.5 mg/dL — ABNORMAL LOW (ref >39.00)
LDL Cholesterol: 40 mg/dL (ref 0–99)
NonHDL: 58.37
Total CHOL/HDL Ratio: 3
Triglycerides: 91 mg/dL (ref 0.0–149.0)
VLDL: 18.2 mg/dL (ref 0.0–40.0)

## 2022-07-01 LAB — IBC + FERRITIN
Ferritin: 27.5 ng/mL (ref 10.0–291.0)
Iron: 32 ug/dL — ABNORMAL LOW (ref 42–145)
Saturation Ratios: 8.2 % — ABNORMAL LOW (ref 20.0–50.0)
TIBC: 389.2 ug/dL (ref 250.0–450.0)
Transferrin: 278 mg/dL (ref 212.0–360.0)

## 2022-07-01 LAB — VITAMIN B12: Vitamin B-12: 128 pg/mL — ABNORMAL LOW (ref 211–911)

## 2022-07-01 MED ORDER — OZEMPIC (0.25 OR 0.5 MG/DOSE) 2 MG/3ML ~~LOC~~ SOPN: PEN_INJECTOR | SUBCUTANEOUS | 0 refills | Status: DC

## 2022-07-01 MED ORDER — FREESTYLE LIBRE 3 SENSOR MISC: 1 refills | Status: DC

## 2022-07-01 NOTE — Assessment & Plan Note (Signed)
Following with hematology.  Office notes reviewed from June 2023.  Determined to be benign and secondary to insulin.

## 2022-07-01 NOTE — Progress Notes (Signed)
Subjective:    Patient ID: Haley Gomez, female    DOB: Dec 25, 1965, 56 y.o.   MRN: [id number]  HPI  Haley Gomez is a very pleasant 56 y.o. female who presents today for complete physical and follow up of chronic conditions.  Immunizations: -Tetanus: Unknown, over 10 years. -Influenza: Due today -Shingles: Never completed -Pneumonia: 2016  Diet: [place].  Exercise: No regular exercise.  Eye exam: Completes annually  Dental exam: Completes semi-annually   Pap Smear: Completed per GYN Mammogram: Completed years ago, will schedule with GYN   Colonoscopy: Never completed, referred by the cancer center   BP Readings from Last 3 Encounters:  07/01/22 124/78  03/12/22 (!) 145/79  02/25/22 131/78        Review of Systems  Constitutional:  Negative for unexpected weight change.  HENT:  Negative for rhinorrhea.   Respiratory:  Negative for cough and shortness of breath.   Cardiovascular:  Negative for chest pain.  Gastrointestinal:  Negative for constipation and diarrhea.  Genitourinary:  Negative for difficulty urinating.  Musculoskeletal:  Negative for arthralgias and myalgias.  Skin:  Negative for rash.  Allergic/Immunologic: Negative for environmental allergies.  Neurological:  Negative for dizziness and headaches.  Psychiatric/Behavioral:  The patient is not nervous/anxious.          Past Medical History:  Diagnosis Date   Diabetes mellitus without complication ([place])    Hyperlipidemia    Right thalamic infarction ([place])    Stroke [facility])     Social History   Socioeconomic History   Marital status: Divorced    Spouse name: Not on file   Number of children: Not on file   Years of education: Not on file   Highest education level: Not on file  Occupational History   Not on file  Tobacco Use   Smoking status: Never   Smokeless tobacco: Never  Vaping Use   Vaping Use: Never used  Substance and Sexual Activity   Alcohol use: Yes     Alcohol/week: 0.0 standard drinks of alcohol    Comment: social   Drug use: Never   Sexual activity: Not on file  Other Topics Concern   Not on file  Social History Narrative   Single.   3 children.   Works as a Marine scientist at [employer]   Enjoys traveling, spending time with friends, exercising, reading.   Social Determinants of Health   Financial Resource Strain: Not on file  Food Insecurity: Not on file  Transportation Needs: Not on file  Physical Activity: Not on file  Stress: Not on file  Social Connections: Not on file  Intimate Partner Violence: Not on file    Past Surgical History:  Procedure Laterality Date   CESAREAN SECTION     GALLBLADDER SURGERY  1997    Family History  Problem Relation Age of Onset   Diabetes Mother    Alcohol abuse Father    Hypertension Father    Kidney disease Father    Stroke Father     Allergies  Allergen Reactions   Bactrim [Sulfamethoxazole-Trimethoprim] Nausea Only    Current Outpatient Medications on File Prior to Visit  Medication Sig Dispense Refill   amitriptyline (ELAVIL) 50 MG tablet Take 1 tablet (50 mg total) by mouth at bedtime. For nerve pain. 90 tablet 1   blood glucose meter kit and supplies KIT Dispense based on patient and insurance preference. Use up to four times daily as directed. (FOR ICD-9 250.00, 250.01). 1 each  0   clopidogrel (PLAVIX) 75 MG tablet TAKE 1 TABLET BY MOUTH EVERY DAY 90 tablet 1   Continuous Blood Gluc Receiver (FREESTYLE LIBRE 14 DAY READER) DEVI 1 Device by Does not apply route 3 (three) times daily. 1 each 0   gabapentin (NEURONTIN) 300 MG capsule Take 1 capsule (300 mg total) by mouth at bedtime. For nerve pain. 90 capsule 2   glipiZIDE (GLUCOTROL XL) 10 MG 24 hr tablet TAKE 1 TAB BY MOUTH DAILY WITH BREAKFAST FOR DIABETES. 90 tablet 0   insulin glargine-yfgn (SEMGLEE) 100 UNIT/ML Pen Inject 25 Units into the skin daily. For diabetes. 30 mL 0   Insulin Pen Needle (BD PEN NEEDLE NANO U/F) 32G X 4 MM  MISC USE WITH INSULIN PEN AS DIRECTED 100 each 1   metFORMIN (GLUCOPHAGE) 1000 MG tablet Take 1 tablet (1,000 mg total) by mouth 2 (two) times daily. For diabetes. 180 tablet 1   ONETOUCH VERIO test strip USE 4 TIMES A DAY AS DIRECTED 100 strip 5   rosuvastatin (CRESTOR) 40 MG tablet Take 1 tablet (40 mg total) by mouth daily. for cholesterol. 90 tablet 2   No current facility-administered medications on file prior to visit.    BP 124/78   Pulse (!) 105   Temp (!) 97.3 F (36.3 C) (Temporal)   Ht 5' 1.5" (1.562 m)   Wt 165 lb (74.8 kg)   SpO2 98%   BMI 30.67 kg/m  Objective:   Physical Exam HENT:     Right Ear: Tympanic membrane and ear canal normal.     Left Ear: Tympanic membrane and ear canal normal.     Nose: Nose normal.  Eyes:     Conjunctiva/sclera: Conjunctivae normal.     Pupils: Pupils are equal, round, and reactive to light.  Neck:     Thyroid: No thyromegaly.  Cardiovascular:     Rate and Rhythm: Normal rate and regular rhythm.     Heart sounds: No murmur heard. Pulmonary:     Effort: Pulmonary effort is normal.     Breath sounds: Normal breath sounds. No rales.  Abdominal:     General: Bowel sounds are normal.     Palpations: Abdomen is soft.     Tenderness: There is no abdominal tenderness.  Musculoskeletal:        General: Normal range of motion.     Cervical back: Neck supple.  Lymphadenopathy:     Cervical: No cervical adenopathy.  Skin:    General: Skin is warm and dry.     Findings: No rash.  Neurological:     Mental Status: She is alert and oriented to person, place, and time.     Cranial Nerves: No cranial nerve deficit.     Deep Tendon Reflexes: Reflexes are normal and symmetric.  Psychiatric:        Mood and Affect: Mood normal.           Assessment & Plan:   Problem List Items Addressed This Visit       Digestive   GERD (gastroesophageal reflux disease)    Controlled.  No concerns today.         Endocrine   Type 2  diabetes mellitus ([place])    She discontinued Trulicity in August 6415 due to side effects.   Remain off Trulicity. Consider Ozempic vs Mounjaro. Continue Semglee 25 units daily. Continue metformin 1000 mg BID.  Continue Glipizide XL 10 mg daily.  Consider Ozempic vs Monjaro.  Repeat A1C pending today. Managed on statin.  Urine mircoalbumin pending   Follow up in 3-6 months.        Relevant Medications   Continuous Blood Gluc Sensor (FREESTYLE LIBRE 3 SENSOR) MISC   Other Relevant Orders   Hemoglobin A1c   Microalbumin / creatinine urine ratio     Other   Hyperlipidemia    LDL above goal in April 2023, switched to rosuvastatin 40 mg. Repeat lipid panel pending.  Continue rosuvastatin 40 mg daily.       Relevant Orders   Comprehensive metabolic panel   Lipid panel   Preventative health care - Primary     Tetanus and influenza vaccines due, provided today. Pap smear overdue, she prefers to see GYN and will schedule. Mammogram overdue, again prefers to see GYN and will schedule. Colonoscopy overdue, referred by oncology.   Discussed the importance of a healthy diet and regular exercise in order for weight loss, and to reduce the risk of further co-morbidity.  Exam stable. Labs pending.  Follow up in 1 year for repeat physical.       Leucocytosis    Following with hematology.  Office notes reviewed from June 2023.  Determined to be benign and secondary to insulin.       History of CVA (cerebrovascular accident)    Asymptomatic. Continue clopidogrel 75 mg daily.  Repeat lipid panel pending for LDL goal of <70.  BP controlled. Work on diabetes control.        Nerve pain    Controlled.  Continue amitriptyline 50 mg HS, gabapentin 300 mg HS. Continue to monitor.       Vitamin B12 deficiency    Secondary to metformin per hematology.  Following with hematology, office notes reviewed from June 2023.      Relevant Orders   IBC + Ferritin   Vitamin  B12   Microcytic anemia    Continue Vitamin B12 supplement.  Repeat B12 level pending.      Relevant Orders   IBC + Ferritin   CBC   Vitamin D deficiency    Continue vitamin D supplement, she does not recall the dose. Repeat vitamin D level pending.      Relevant Orders   VITAMIN D 25 Hydroxy (Vit-D Deficiency, Fractures)       Pleas Koch, NP

## 2022-07-01 NOTE — Assessment & Plan Note (Addendum)
Asymptomatic. Continue clopidogrel 75 mg daily.  Repeat lipid panel pending for LDL goal of <70.  BP controlled. Work on diabetes control.

## 2022-07-01 NOTE — Assessment & Plan Note (Signed)
LDL above goal in April 2023, switched to rosuvastatin 40 mg. Repeat lipid panel pending.  Continue rosuvastatin 40 mg daily.

## 2022-07-01 NOTE — Assessment & Plan Note (Signed)
Secondary to metformin per hematology.  Following with hematology, office notes reviewed from June 2023.

## 2022-07-01 NOTE — Patient Instructions (Signed)
Stop by the lab prior to leaving today. I will notify you of your results once received.   Follow up with GYN for your pap smear and mammogram.  Notify me if you don't hear from GI regarding the colonoscopy.  It was a pleasure to see you today!  Preventive Care 36-55 Years Old, Female Preventive care refers to lifestyle choices and visits with your health care provider that can promote health and wellness. Preventive care visits are also called wellness exams. What can I expect for my preventive care visit? Counseling Your health care provider may ask you questions about your: Medical history, including: Past medical problems. Family medical history. Pregnancy history. Current health, including: Menstrual cycle. Method of birth control. Emotional well-being. Home life and relationship well-being. Sexual activity and sexual health. Lifestyle, including: Alcohol, nicotine or tobacco, and drug use. Access to firearms. Diet, exercise, and sleep habits. Work and work Statistician. Sunscreen use. Safety issues such as seatbelt and bike helmet use. Physical exam Your health care provider will check your: Height and weight. These may be used to calculate your BMI (body mass index). BMI is a measurement that tells if you are at a healthy weight. Waist circumference. This measures the distance around your waistline. This measurement also tells if you are at a healthy weight and may help predict your risk of certain diseases, such as type 2 diabetes and high blood pressure. Heart rate and blood pressure. Body temperature. Skin for abnormal spots. What immunizations do I need?  Vaccines are usually given at various ages, according to a schedule. Your health care provider will recommend vaccines for you based on your age, medical history, and lifestyle or other factors, such as travel or where you work. What tests do I need? Screening Your health care provider may recommend screening  tests for certain conditions. This may include: Lipid and cholesterol levels. Diabetes screening. This is done by checking your blood sugar (glucose) after you have not eaten for a while (fasting). Pelvic exam and Pap test. Hepatitis B test. Hepatitis C test. HIV (human immunodeficiency virus) test. STI (sexually transmitted infection) testing, if you are at risk. Lung cancer screening. Colorectal cancer screening. Mammogram. Talk with your health care provider about when you should start having regular mammograms. This may depend on whether you have a family history of breast cancer. BRCA-related cancer screening. This may be done if you have a family history of breast, ovarian, tubal, or peritoneal cancers. Bone density scan. This is done to screen for osteoporosis. Talk with your health care provider about your test results, treatment options, and if necessary, the need for more tests. Follow these instructions at home: Eating and drinking  Eat a diet that includes fresh fruits and vegetables, whole grains, lean protein, and low-fat dairy products. Take vitamin and mineral supplements as recommended by your health care provider. Do not drink alcohol if: Your health care provider tells you not to drink. You are pregnant, may be pregnant, or are planning to become pregnant. If you drink alcohol: Limit how much you have to 0-1 drink a day. Know how much alcohol is in your drink. In the [place] one drink equals one 12 oz bottle of beer (355 mL), one 5 oz glass of wine (148 mL), or one 1 oz glass of hard liquor (44 mL). Lifestyle Brush your teeth every morning and night with fluoride toothpaste. Floss one time each day. Exercise for at least 30 minutes 5 or more days each week. Do not  use any products that contain nicotine or tobacco. These products include cigarettes, chewing tobacco, and vaping devices, such as e-cigarettes. If you need help quitting, ask your health care provider. Do not  use drugs. If you are sexually active, practice safe sex. Use a condom or other form of protection to prevent STIs. If you do not wish to become pregnant, use a form of birth control. If you plan to become pregnant, see your health care provider for a prepregnancy visit. Take aspirin only as told by your health care provider. Make sure that you understand how much to take and what form to take. Work with your health care provider to find out whether it is safe and beneficial for you to take aspirin daily. Find healthy ways to manage stress, such as: Meditation, yoga, or listening to music. Journaling. Talking to a trusted person. Spending time with friends and family. Minimize exposure to UV radiation to reduce your risk of skin cancer. Safety Always wear your seat belt while driving or riding in a vehicle. Do not drive: If you have been drinking alcohol. Do not ride with someone who has been drinking. When you are tired or distracted. While texting. If you have been using any mind-altering substances or drugs. Wear a helmet and other protective equipment during sports activities. If you have firearms in your house, make sure you follow all gun safety procedures. Seek help if you have been physically or sexually abused. What's next? Visit your health care provider once a year for an annual wellness visit. Ask your health care provider how often you should have your eyes and teeth checked. Stay up to date on all vaccines. This information is not intended to replace advice given to you by your health care provider. Make sure you discuss any questions you have with your health care provider. Document Revised: 03/10/2021 Document Reviewed: 03/10/2021 Elsevier Patient Education  [place].

## 2022-07-01 NOTE — Assessment & Plan Note (Signed)
Controlled.  Continue amitriptyline 50 mg HS, gabapentin 300 mg HS. Continue to monitor.

## 2022-07-01 NOTE — Assessment & Plan Note (Addendum)
Continue vitamin D supplement, she does not recall the dose. Repeat vitamin D level pending.

## 2022-07-01 NOTE — Assessment & Plan Note (Signed)
Continue Vitamin B12 supplement.  Repeat B12 level pending.

## 2022-07-01 NOTE — Assessment & Plan Note (Signed)
  Tetanus and influenza vaccines due, provided today. Pap smear overdue, she prefers to see GYN and will schedule. Mammogram overdue, again prefers to see GYN and will schedule. Colonoscopy overdue, referred by oncology.   Discussed the importance of a healthy diet and regular exercise in order for weight loss, and to reduce the risk of further co-morbidity.  Exam stable. Labs pending.  Follow up in 1 year for repeat physical.

## 2022-07-01 NOTE — Assessment & Plan Note (Addendum)
She discontinued Trulicity in August 1740 due to side effects.   Remain off Trulicity. Consider Ozempic vs Mounjaro. Continue Semglee 25 units daily. Continue metformin 1000 mg BID.  Continue Glipizide XL 10 mg daily.  Consider Ozempic vs Monjaro.   Repeat A1C pending today. Managed on statin.  Urine mircoalbumin pending   Follow up in 3-6 months.

## 2022-07-01 NOTE — Assessment & Plan Note (Signed)
Controlled. No concerns today. 

## 2022-07-04 ENCOUNTER — Telehealth: Payer: Self-pay | Admitting: Hematology and Oncology

## 2022-07-04 NOTE — Telephone Encounter (Signed)
Contacted patient to scheduled appointments. Left message with appointment details and a call back number if patient had any questions or could not accommodate the time we provided.   

## 2022-07-07 NOTE — Telephone Encounter (Addendum)
Informed patient we will cancel prescription sent to [facility] and send in to [facility].  Patient also stated in regards to her recent lab results she is agreeable to starting a kidney protection medication but wants to know if there is an alternative instead of lisinopril.  Scheduled 6 week follow up for diabetes.

## 2022-07-08 ENCOUNTER — Other Ambulatory Visit: Payer: Self-pay

## 2022-07-08 DIAGNOSIS — E1159 Type 2 diabetes mellitus with other circulatory complications: Secondary | ICD-10-CM

## 2022-07-08 MED ORDER — OZEMPIC (0.25 OR 0.5 MG/DOSE) 2 MG/3ML ~~LOC~~ SOPN: PEN_INJECTOR | SUBCUTANEOUS | 0 refills | Status: DC

## 2022-07-21 ENCOUNTER — Encounter: Payer: Self-pay | Admitting: Hematology and Oncology

## 2022-07-21 ENCOUNTER — Inpatient Hospital Stay: Payer: BC Managed Care – PPO | Admitting: Hematology and Oncology

## 2022-08-16 DIAGNOSIS — R809 Proteinuria, unspecified: Secondary | ICD-10-CM

## 2022-08-17 MED ORDER — LOSARTAN POTASSIUM 25 MG PO TABS: 12.5000 mg | ORAL_TABLET | Freq: Every day | ORAL | 3 refills | Status: DC

## 2022-08-26 ENCOUNTER — Telehealth: Payer: BC Managed Care – PPO | Admitting: Primary Care

## 2022-10-07 ENCOUNTER — Other Ambulatory Visit: Payer: Self-pay | Admitting: Primary Care

## 2022-10-07 DIAGNOSIS — E1159 Type 2 diabetes mellitus with other circulatory complications: Secondary | ICD-10-CM

## 2022-10-07 NOTE — Telephone Encounter (Signed)
Lvm for patient tcb and schedule 

## 2022-10-07 NOTE — Telephone Encounter (Signed)
Patient is overdue for diabetes follow-up, please schedule.  Let me know once she is scheduled.

## 2022-10-14 ENCOUNTER — Other Ambulatory Visit: Payer: Self-pay | Admitting: Primary Care

## 2022-10-14 DIAGNOSIS — E1159 Type 2 diabetes mellitus with other circulatory complications: Secondary | ICD-10-CM

## 2022-10-14 NOTE — Telephone Encounter (Signed)
Lvmtcb, sent mychart message  

## 2022-10-14 NOTE — Telephone Encounter (Signed)
Patient is overdue for diabetes follow up, this will be required prior to any further refills.  Please schedule. Let me know once scheduled. Thanks!

## 2022-10-17 NOTE — Telephone Encounter (Signed)
Lvmtcb

## 2022-10-18 NOTE — Telephone Encounter (Signed)
LVM for patient to call back and schedule

## 2022-10-19 NOTE — Telephone Encounter (Signed)
Called and scheduled patient appointment for 10/21/22.

## 2022-10-21 ENCOUNTER — Ambulatory Visit: Payer: BC Managed Care – PPO | Admitting: Primary Care

## 2022-10-21 NOTE — Telephone Encounter (Signed)
Patient cancelled appointment.  Need to have her rescheduled for ASAP. Is there a reason she keeps cancelling?

## 2022-10-21 NOTE — Telephone Encounter (Signed)
Called and spoke to patient, she stated she canceled her appointment today because the police were at her house investigating her daughters missing persons case. She is not able to come in the office for an appointment until all this gets settled. She advised that she has enough insulin to last her about another month. She understands that she has to have appointment before her insulin will be refilled and stated she will call back in a couple weeks to schedule.

## 2022-11-28 ENCOUNTER — Other Ambulatory Visit: Payer: Self-pay | Admitting: Primary Care

## 2022-11-28 DIAGNOSIS — E1159 Type 2 diabetes mellitus with other circulatory complications: Secondary | ICD-10-CM

## 2022-12-13 ENCOUNTER — Encounter: Payer: Self-pay | Admitting: Hematology and Oncology

## 2022-12-22 DIAGNOSIS — E1159 Type 2 diabetes mellitus with other circulatory complications: Secondary | ICD-10-CM

## 2022-12-22 MED ORDER — FREESTYLE LIBRE 3 SENSOR MISC: 0 refills | Status: DC

## 2022-12-28 ENCOUNTER — Ambulatory Visit (INDEPENDENT_AMBULATORY_CARE_PROVIDER_SITE_OTHER): Payer: Self-pay | Admitting: Primary Care

## 2022-12-28 ENCOUNTER — Encounter: Payer: Self-pay | Admitting: Primary Care

## 2022-12-28 VITALS — BP 114/78 | HR 108 | Temp 98.2°F | Ht 61.5 in | Wt 165.0 lb

## 2022-12-28 DIAGNOSIS — E1159 Type 2 diabetes mellitus with other circulatory complications: Secondary | ICD-10-CM

## 2022-12-28 DIAGNOSIS — F4323 Adjustment disorder with mixed anxiety and depressed mood: Secondary | ICD-10-CM | POA: Insufficient documentation

## 2022-12-28 LAB — POCT GLYCOSYLATED HEMOGLOBIN (HGB A1C): Hemoglobin A1C: 9.7 % — AB (ref 4.0–5.6)

## 2022-12-28 MED ORDER — INSULIN GLARGINE-YFGN 100 UNIT/ML ~~LOC~~ SOPN: 30.0000 [IU] | PEN_INJECTOR | Freq: Every day | SUBCUTANEOUS | 0 refills | Status: DC

## 2022-12-28 NOTE — Progress Notes (Signed)
Subjective:    Patient ID: Haley Gomez, female    DOB: [birth year], 57 y.o.   MRN: [id number]  HPI  Haley Gomez is a very pleasant 57 y.o. female with a history of type 2 diabetes, hyperlipidemia, CVA, GERD who presents today for follow up of diabetes and anxiety/depression.  1) Type 2 Diabetes:  Current medications include: metformin 1000 mg BID, glipizide XL 10 mg daily, Semglee 25 units daily, Ozempic 0.5 mg weekly.   She is not injecting Ozempic as she developed foul smelling and cloudy urine.   She has been checking her glucose readings continuously, last check was one week ago. Readings are ranging 40's-60's at night most every night during the night. She has been taking glucose tablets during the night when her phone alarms her. During the day her readings are ranging 140's to low 200's.   She takes her Semglee at night.   Last A1C: 10.7 in October, 9.7 today Last Eye Exam: Due Last Foot Exam: UTD Pneumonia Vaccination: Urine Microalbumin: UTD Statin: rosuvastatin   Dietary changes since last visit: Increased protein intake. Also started eating breakfast. She has not seen a diabetes nutritionist.    Exercise: None.  BP Readings from Last 3 Encounters:  12/28/22 114/78  07/01/22 124/78  03/12/22 (!) 145/79   2) Anxiety/Depression: Chronic over the years, but worse over the last 6 months. Symptoms include feeling irritable, fatigue, feeling down, low motivation, feeling anxious. She feels that her symptoms cause her to make poor eating choices and have affected her glucose levels.   She is interested in seeing a therapist.      Review of Systems  Eyes:  Negative for visual disturbance.  Cardiovascular:  Negative for chest pain.  Neurological:  Positive for numbness. Negative for dizziness.         Past Medical History:  Diagnosis Date   Diabetes mellitus without complication    Hyperlipidemia    Right thalamic infarction    Stroke      Social History   Socioeconomic History   Marital status: Divorced    Spouse name: Not on file   Number of children: Not on file   Years of education: Not on file   Highest education level: Not on file  Occupational History   Not on file  Tobacco Use   Smoking status: Never   Smokeless tobacco: Never  Vaping Use   Vaping Use: Never used  Substance and Sexual Activity   Alcohol use: Yes    Alcohol/week: 0.0 standard drinks of alcohol    Comment: social   Drug use: Never   Sexual activity: Not on file  Other Topics Concern   Not on file  Social History Narrative   Single.   3 children.   Works as a Marine scientist at [employer]   Enjoys traveling, spending time with friends, exercising, reading.   Social Determinants of Health   Financial Resource Strain: Not on file  Food Insecurity: Not on file  Transportation Needs: Not on file  Physical Activity: Not on file  Stress: Not on file  Social Connections: Not on file  Intimate Partner Violence: Not on file    Past Surgical History:  Procedure Laterality Date   CESAREAN SECTION     GALLBLADDER SURGERY  1997    Family History  Problem Relation Age of Onset   Diabetes Mother    Alcohol abuse Father    Hypertension Father    Kidney disease Father  Stroke Father     Allergies  Allergen Reactions   Bactrim [Sulfamethoxazole-Trimethoprim] Nausea Only    Current Outpatient Medications on File Prior to Visit  Medication Sig Dispense Refill   amitriptyline (ELAVIL) 50 MG tablet Take 1 tablet (50 mg total) by mouth at bedtime. For nerve pain. 90 tablet 1   blood glucose meter kit and supplies KIT Dispense based on patient and insurance preference. Use up to four times daily as directed. (FOR ICD-9 250.00, 250.01). 1 each 0   clopidogrel (PLAVIX) 75 MG tablet TAKE 1 TABLET BY MOUTH EVERY DAY 90 tablet 1   Continuous Blood Gluc Receiver (FREESTYLE LIBRE 14 DAY READER) DEVI 1 Device by Does not apply route 3 (three) times daily.  1 each 0   Continuous Blood Gluc Sensor (FREESTYLE LIBRE 3 SENSOR) MISC PLACE 1 SENSOR ON SKIN EVERY 14 DAYS - CHECKING GLUCOSE 6 each 0   gabapentin (NEURONTIN) 300 MG capsule Take 1 capsule (300 mg total) by mouth at bedtime. For nerve pain. 90 capsule 2   glipiZIDE (GLUCOTROL XL) 10 MG 24 hr tablet 1 TAB BY MOUTH DAILY WITH BREAKFAST FOR DIABETES 90 tablet 0   Insulin Pen Needle (BD PEN NEEDLE NANO U/F) 32G X 4 MM MISC USE WITH INSULIN PEN AS DIRECTED 100 each 1   losartan (COZAAR) 25 MG tablet Take 0.5 tablets (12.5 mg total) by mouth daily. For kidney protection 45 tablet 3   metFORMIN (GLUCOPHAGE) 1000 MG tablet Take 1 tablet (1,000 mg total) by mouth 2 (two) times daily. For diabetes. 180 tablet 1   ONETOUCH VERIO test strip USE 4 TIMES A DAY AS DIRECTED 100 strip 5   rosuvastatin (CRESTOR) 40 MG tablet Take 1 tablet (40 mg total) by mouth daily. for cholesterol. 90 tablet 2   No current facility-administered medications on file prior to visit.    BP 114/78   Pulse (!) 108   Temp 98.2 F (36.8 C) (Temporal)   Ht 5' 1.5" (1.562 m)   Wt 165 lb (74.8 kg)   SpO2 97%   BMI 30.67 kg/m  Objective:   Physical Exam Cardiovascular:     Rate and Rhythm: Normal rate and regular rhythm.  Pulmonary:     Effort: Pulmonary effort is normal.     Breath sounds: Normal breath sounds.  Musculoskeletal:     Cervical back: Neck supple.  Skin:    General: Skin is warm and dry.           Assessment & Plan:  Type 2 diabetes mellitus with other circulatory complication, without long-term current use of insulin Assessment & Plan: Improved but uncontrolled with A1C today of 9.7.  She does not wish to proceed with GLP1 treatment.  Continue metformin 1000 mg BID and Glipizide XL 10 mg daily. Change Semglee to AM. Increase Semglee to 30 units daily.  I've asked that she message me on MyChart in 2 weeks if glucose readings remain consistently above 150.  She will also notify if she  continues to experience hypoglycemic episodes. May need to reduce/discontinue Glipizide.   Follow up in 3 months.   Orders: -     POCT glycosylated hemoglobin (Hb A1C) -     Referral to Nutrition and Diabetes Services -     Insulin Glargine-yfgn; Inject 30 Units into the skin daily. For diabetes.  Dispense: 30 mL; Refill: 0  Adjustment reaction with anxiety and depression Assessment & Plan: Caregiver strain.  Discussed options. Will refer for therapy.  She agrees.   Orders: -     Ambulatory referral to Psychology        Pleas Koch, NP

## 2022-12-28 NOTE — Assessment & Plan Note (Addendum)
Improved but uncontrolled with A1C today of 9.7.  She does not wish to proceed with GLP1 treatment.  Continue metformin 1000 mg BID and Glipizide XL 10 mg daily. Change Semglee to AM. Increase Semglee to 30 units daily.  I've asked that she message me on MyChart in 2 weeks if glucose readings remain consistently above 150.  She will also notify if she continues to experience hypoglycemic episodes. May need to reduce/discontinue Glipizide.   Follow up in 3 months.

## 2022-12-28 NOTE — Assessment & Plan Note (Signed)
Caregiver strain.  Discussed options. Will refer for therapy.  She agrees.

## 2022-12-28 NOTE — Patient Instructions (Signed)
We increased your insulin to 30 units. Start injecting your insulin in the morning.  Send me glucose readings via MyChart in 2 weeks.  You will either be contacted via phone regarding your referral to the therapist and nutritionist, or you may receive a letter on your MyChart portal from our referral team with instructions for scheduling an appointment. Please let us know if you have not been contacted by anyone within two weeks.  Please schedule a follow up visit for 3 months.  It was a pleasure to see you today!

## 2022-12-30 DIAGNOSIS — E1159 Type 2 diabetes mellitus with other circulatory complications: Secondary | ICD-10-CM

## 2022-12-30 DIAGNOSIS — M792 Neuralgia and neuritis, unspecified: Secondary | ICD-10-CM

## 2022-12-30 DIAGNOSIS — E1129 Type 2 diabetes mellitus with other diabetic kidney complication: Secondary | ICD-10-CM

## 2022-12-30 DIAGNOSIS — E785 Hyperlipidemia, unspecified: Secondary | ICD-10-CM

## 2023-01-01 MED ORDER — LOSARTAN POTASSIUM 25 MG PO TABS: 12.5000 mg | ORAL_TABLET | Freq: Every day | ORAL | 1 refills | Status: DC

## 2023-01-01 MED ORDER — BD PEN NEEDLE NANO U/F 32G X 4 MM MISC
1 refills | Status: DC
Start: 2023-01-01 — End: 2023-03-31

## 2023-01-02 ENCOUNTER — Other Ambulatory Visit: Payer: Self-pay | Admitting: Primary Care

## 2023-01-02 DIAGNOSIS — E1159 Type 2 diabetes mellitus with other circulatory complications: Secondary | ICD-10-CM

## 2023-01-02 MED ORDER — INSULIN GLARGINE-YFGN 100 UNIT/ML ~~LOC~~ SOPN
30.0000 [IU] | PEN_INJECTOR | Freq: Every day | SUBCUTANEOUS | 0 refills | Status: DC
Start: 2023-01-02 — End: 2023-01-02

## 2023-01-02 MED ORDER — GLIPIZIDE ER 10 MG PO TB24
ORAL_TABLET | ORAL | 0 refills | Status: DC
Start: 2023-01-02 — End: 2023-03-29

## 2023-01-02 MED ORDER — GABAPENTIN 300 MG PO CAPS: 300.0000 mg | ORAL_CAPSULE | Freq: Every day | ORAL | 0 refills | Status: DC

## 2023-01-02 MED ORDER — AMITRIPTYLINE HCL 50 MG PO TABS
50.0000 mg | ORAL_TABLET | Freq: Every day | ORAL | 0 refills | Status: DC
Start: 2023-01-02 — End: 2023-05-26

## 2023-01-02 MED ORDER — ROSUVASTATIN CALCIUM 40 MG PO TABS: 40.0000 mg | ORAL_TABLET | Freq: Every day | ORAL | 0 refills | Status: DC

## 2023-01-02 MED ORDER — METFORMIN HCL 1000 MG PO TABS: 1000.0000 mg | ORAL_TABLET | Freq: Two times a day (BID) | ORAL | 0 refills | Status: DC

## 2023-01-03 ENCOUNTER — Encounter: Payer: Self-pay | Admitting: Hematology and Oncology

## 2023-01-19 ENCOUNTER — Other Ambulatory Visit: Payer: Self-pay | Admitting: Adult Health

## 2023-01-31 ENCOUNTER — Ambulatory Visit (INDEPENDENT_AMBULATORY_CARE_PROVIDER_SITE_OTHER): Payer: PRIVATE HEALTH INSURANCE | Admitting: Primary Care

## 2023-01-31 ENCOUNTER — Encounter: Payer: Self-pay | Admitting: Hematology and Oncology

## 2023-01-31 ENCOUNTER — Encounter: Payer: Self-pay | Admitting: Primary Care

## 2023-01-31 ENCOUNTER — Encounter: Payer: Self-pay | Admitting: *Deleted

## 2023-01-31 VITALS — BP 108/74 | HR 110 | Temp 97.0°F | Ht 61.5 in | Wt 164.0 lb

## 2023-01-31 DIAGNOSIS — R053 Chronic cough: Secondary | ICD-10-CM

## 2023-01-31 HISTORY — DX: Chronic cough: R05.3

## 2023-01-31 MED ORDER — PANTOPRAZOLE SODIUM 20 MG PO TBEC
20.0000 mg | DELAYED_RELEASE_TABLET | Freq: Every day | ORAL | 0 refills | Status: DC
Start: 2023-01-31 — End: 2023-04-19

## 2023-01-31 MED ORDER — CETIRIZINE HCL 10 MG PO TABS
10.0000 mg | ORAL_TABLET | Freq: Every day | ORAL | 0 refills | Status: DC
Start: 2023-01-31 — End: 2023-04-02

## 2023-01-31 NOTE — Progress Notes (Signed)
Subjective:    Patient ID: Haley Gomez, female    DOB: 1966/09/24, 57 y.o.   MRN: [id number]  Cough Associated symptoms include postnasal drip. Pertinent negatives include no chills, fever or sore throat.    Haley Gomez is a very pleasant 57 y.o. female with a history of GERD, type 2 diabetes, hyperlipidemia, CVA who presents today to discuss cough.  Three weeks ago she was at work drinking juice, felt like her juice went down the trachea, began coughing, and experienced throat tightness. Her coughing persisted into the following day.   Since then she continues to experience an intermittent cough with drinking liquids, eating food, and with post nasal drip.  She feels as though there are residual liquid in her throat and notices a global sensation to the upper esophagus. She's woken from sleep with a cough several times. She's tried to focus on swallowing harder and that has helped to prevent a cough.   She denies esophageal burning, chest pressure, choking, sensation of food being stuck in her chest. She cannot pinpoint a certain trigger. Historically, she's noticed coughing with vinegar based foods, pickles, and pineapple but she has avoided those foods for years.   She does not taking PPI or heartburn medication. She has never undergone an upper endoscopy.  Review of Systems  Constitutional:  Negative for chills and fever.  HENT:  Positive for postnasal drip and trouble swallowing. Negative for congestion and sore throat.   Respiratory:  Positive for cough.          Past Medical History:  Diagnosis Date   Diabetes mellitus without complication (HCC)    Hyperlipidemia    Right thalamic infarction (HCC)    Stroke [facility])     Social History   Socioeconomic History   Marital status: Divorced    Spouse name: Not on file   Number of children: Not on file   Years of education: Not on file   Highest education level: Not on file  Occupational History   Not on file   Tobacco Use   Smoking status: Never   Smokeless tobacco: Never  Vaping Use   Vaping Use: Never used  Substance and Sexual Activity   Alcohol use: Yes    Alcohol/week: 0.0 standard drinks of alcohol    Comment: social   Drug use: Never   Sexual activity: Not on file  Other Topics Concern   Not on file  Social History Narrative   Single.   3 children.   Works as a Engineer, civil (consulting) at [employer]   Enjoys traveling, spending time with friends, exercising, reading.   Social Determinants of Health   Financial Resource Strain: Not on file  Food Insecurity: Not on file  Transportation Needs: Not on file  Physical Activity: Not on file  Stress: Not on file  Social Connections: Not on file  Intimate Partner Violence: Not on file    Past Surgical History:  Procedure Laterality Date   CESAREAN SECTION     GALLBLADDER SURGERY  1997    Family History  Problem Relation Age of Onset   Diabetes Mother    Alcohol abuse Father    Hypertension Father    Kidney disease Father    Stroke Father     Allergies  Allergen Reactions   Bactrim [Sulfamethoxazole-Trimethoprim] Nausea Only    Current Outpatient Medications on File Prior to Visit  Medication Sig Dispense Refill   amitriptyline (ELAVIL) 50 MG tablet Take 1 tablet (50 mg total)  by mouth at bedtime. For nerve pain. 90 tablet 0   blood glucose meter kit and supplies KIT Dispense based on patient and insurance preference. Use up to four times daily as directed. (FOR ICD-9 250.00, 250.01). 1 each 0   clopidogrel (PLAVIX) 75 MG tablet TAKE 1 TABLET BY MOUTH EVERY DAY 30 tablet 0   Continuous Blood Gluc Receiver (FREESTYLE LIBRE 14 DAY READER) DEVI 1 Device by Does not apply route 3 (three) times daily. 1 each 0   Continuous Blood Gluc Sensor (FREESTYLE LIBRE 3 SENSOR) MISC PLACE 1 SENSOR ON SKIN EVERY 14 DAYS - CHECKING GLUCOSE 6 each 0   gabapentin (NEURONTIN) 300 MG capsule Take 1 capsule (300 mg total) by mouth at bedtime. For nerve pain. 90  capsule 0   glipiZIDE (GLUCOTROL XL) 10 MG 24 hr tablet 1 TAB BY MOUTH DAILY WITH BREAKFAST FOR DIABETES 90 tablet 0   insulin glargine (LANTUS SOLOSTAR) 100 UNIT/ML Solostar Pen Inject 30 Units into the skin daily. for diabetes. 30 mL 0   Insulin Pen Needle (BD PEN NEEDLE NANO U/F) 32G X 4 MM MISC USE WITH INSULIN PEN AS DIRECTED 100 each 1   losartan (COZAAR) 25 MG tablet Take 0.5 tablets (12.5 mg total) by mouth daily. For kidney protection 45 tablet 1   metFORMIN (GLUCOPHAGE) 1000 MG tablet Take 1 tablet (1,000 mg total) by mouth 2 (two) times daily. For diabetes. 180 tablet 0   ONETOUCH VERIO test strip USE 4 TIMES A DAY AS DIRECTED 100 strip 5   rosuvastatin (CRESTOR) 40 MG tablet Take 1 tablet (40 mg total) by mouth daily. for cholesterol. 90 tablet 0   No current facility-administered medications on file prior to visit.    BP 108/74   Pulse (!) 110   Temp (!) 97 F (36.1 C) (Temporal)   Ht 5' 1.5" (1.562 m)   Wt 164 lb (74.4 kg)   SpO2 96%   BMI 30.49 kg/m  Objective:   Physical Exam HENT:     Mouth/Throat:     Mouth: Mucous membranes are moist.     Pharynx: No posterior oropharyngeal erythema.  Neck:     Thyroid: No thyromegaly.  Cardiovascular:     Rate and Rhythm: Normal rate and regular rhythm.  Pulmonary:     Effort: Pulmonary effort is normal.     Breath sounds: Normal breath sounds.  Musculoskeletal:     Cervical back: Neck supple.  Lymphadenopathy:     Cervical: No cervical adenopathy.  Skin:    General: Skin is warm and dry.           Assessment & Plan:  Persistent cough for 3 weeks or longer Assessment & Plan: Symptoms mostly representative of either GERD or allergy induced cough.  Will treat for both.  Start pantoprazole 20 mg daily. Start Zyrtec 20 mg HS.  Referral placed to ENT, especially given history of stroke.  She will update next week.   Orders: -     Pantoprazole Sodium; Take 1 tablet (20 mg total) by mouth daily. For cough   Dispense: 90 tablet; Refill: 0 -     Cetirizine HCl; Take 1 tablet (10 mg total) by mouth daily. For allergies  Dispense: 90 tablet; Refill: 0 -     Ambulatory referral to ENT        Doreene Nest, NP

## 2023-01-31 NOTE — Assessment & Plan Note (Signed)
Symptoms mostly representative of either GERD or allergy induced cough.  Will treat for both.  Start pantoprazole 20 mg daily. Start Zyrtec 20 mg HS.  Referral placed to ENT, especially given history of stroke.  She will update next week.

## 2023-01-31 NOTE — Patient Instructions (Signed)
Start pantoprazole 20 mg daily for cough.  Start zyrtec 10 mg at bedtime for allergies/drainage.  You will either be contacted via phone regarding your referral to ENT, or you may receive a letter on your MyChart portal from our referral team with instructions for scheduling an appointment. Please let us know if you have not been contacted by anyone within two weeks.  Please update me soon!

## 2023-02-07 ENCOUNTER — Other Ambulatory Visit: Payer: Self-pay | Admitting: Primary Care

## 2023-02-07 DIAGNOSIS — E1159 Type 2 diabetes mellitus with other circulatory complications: Secondary | ICD-10-CM

## 2023-02-23 ENCOUNTER — Other Ambulatory Visit: Payer: Self-pay | Admitting: Primary Care

## 2023-02-23 ENCOUNTER — Other Ambulatory Visit: Payer: Self-pay | Admitting: Adult Health

## 2023-02-23 DIAGNOSIS — R053 Chronic cough: Secondary | ICD-10-CM

## 2023-02-23 DIAGNOSIS — E1159 Type 2 diabetes mellitus with other circulatory complications: Secondary | ICD-10-CM

## 2023-03-02 ENCOUNTER — Other Ambulatory Visit: Payer: Self-pay | Admitting: Primary Care

## 2023-03-02 ENCOUNTER — Other Ambulatory Visit: Payer: Self-pay | Admitting: Adult Health

## 2023-03-02 DIAGNOSIS — E1159 Type 2 diabetes mellitus with other circulatory complications: Secondary | ICD-10-CM

## 2023-03-14 ENCOUNTER — Ambulatory Visit: Payer: Self-pay | Admitting: Dietician

## 2023-03-20 ENCOUNTER — Ambulatory Visit: Payer: Self-pay | Admitting: Dietician

## 2023-03-29 ENCOUNTER — Other Ambulatory Visit: Payer: Self-pay | Admitting: Primary Care

## 2023-03-29 ENCOUNTER — Ambulatory Visit: Payer: PRIVATE HEALTH INSURANCE | Admitting: Primary Care

## 2023-03-29 ENCOUNTER — Encounter: Payer: Self-pay | Admitting: Primary Care

## 2023-03-29 VITALS — BP 124/82 | HR 91 | Temp 97.0°F | Ht 61.5 in | Wt 168.0 lb

## 2023-03-29 DIAGNOSIS — Z7985 Long-term (current) use of injectable non-insulin antidiabetic drugs: Secondary | ICD-10-CM

## 2023-03-29 DIAGNOSIS — E1159 Type 2 diabetes mellitus with other circulatory complications: Secondary | ICD-10-CM | POA: Diagnosis not present

## 2023-03-29 DIAGNOSIS — Z794 Long term (current) use of insulin: Secondary | ICD-10-CM

## 2023-03-29 DIAGNOSIS — Z7984 Long term (current) use of oral hypoglycemic drugs: Secondary | ICD-10-CM

## 2023-03-29 DIAGNOSIS — Z8673 Personal history of transient ischemic attack (TIA), and cerebral infarction without residual deficits: Secondary | ICD-10-CM | POA: Diagnosis not present

## 2023-03-29 LAB — POCT GLYCOSYLATED HEMOGLOBIN (HGB A1C): Hemoglobin A1C: 10 % — AB (ref 4.0–5.6)

## 2023-03-29 MED ORDER — LANTUS SOLOSTAR 100 UNIT/ML ~~LOC~~ SOPN
35.0000 [IU] | PEN_INJECTOR | Freq: Every day | SUBCUTANEOUS | 0 refills | Status: AC
Start: 2023-03-29 — End: ?

## 2023-03-29 MED ORDER — GLIPIZIDE ER 10 MG PO TB24
ORAL_TABLET | ORAL | 0 refills | Status: DC
Start: 2023-03-29 — End: 2023-07-08

## 2023-03-29 MED ORDER — FREESTYLE LIBRE 3 SENSOR MISC: 1 refills | Status: DC

## 2023-03-29 MED ORDER — TIRZEPATIDE 2.5 MG/0.5ML ~~LOC~~ SOAJ
2.5000 mg | SUBCUTANEOUS | 0 refills | Status: DC
Start: 2023-03-29 — End: 2023-07-12

## 2023-03-29 MED ORDER — CLOPIDOGREL BISULFATE 75 MG PO TABS
75.0000 mg | ORAL_TABLET | Freq: Every day | ORAL | 0 refills | Status: DC
Start: 2023-03-29 — End: 2023-07-12

## 2023-03-29 NOTE — Patient Instructions (Addendum)
Increase your Lantus insulin to 35 units daily.  Start tirzepitide Greggory Keen) for diabetes/weight loss. Start by injecting 2.5 mg into the skin once weekly for 4 weeks, then increase to 5 mg once weekly thereafter. Please notify me once you've used your last 2.5 mg pen so that I can prescribe the next dose.   Continue with weight watchers.  Please schedule appoint with the diabetes nutritionist.  Please schedule a physical to meet with me in 3 months.   It was a pleasure to see you today!

## 2023-03-29 NOTE — Progress Notes (Signed)
Subjective:    Patient ID: Haley Gomez, female    DOB: March 27, 1966, 57 y.o.   MRN: [id number]  HPI  Haley Gomez is a very pleasant 57 y.o. female with a history of type 2 diabetes, hyperlipidemia, CVA, neuropathy, anxiety and depression who presents today for follow-up of diabetes.  She is also needing a few medication refills.   Current medications include: Lantus insulin 30 units daily, glipizide XL 10 mg daily, metformin 1000 mg twice daily.  She has tried Trulicity and Ozempic in the past but she wasn't drinking water while taking these.   She is checking her blood glucose continuously and is getting readings of:  AM fasting: 120's-130's  She continues to experience hypoglycemic episodes several times weekly. She is dropping into the 50's-60's. She has recently transitioned to 12 hour shifts and has been skipping meals during the day while working.   Last A1C: 9.7 in April 2024, 10.0 today Last Eye Exam: Up-to-date Last Foot Exam: Due Pneumonia Vaccination: 2016 Urine Microalbumin: Up-to-date Statin: Rosuvastatin  Dietary changes since last visit: She has yet to schedule the nutrition class. She recently joined [employer]. 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    Exercise: She is active at work.   BP Readings from Last 3 Encounters:  03/29/23 124/82  01/31/23 108/74  12/28/22 114/78       Review of Systems  Eyes:  Negative for visual disturbance.  Respiratory:  Negative for shortness of breath.   Cardiovascular:  Negative for chest pain.  Neurological:  Negative for dizziness and numbness.         Past Medical History:  Diagnosis Date   Diabetes mellitus without complication (HCC)    Hyperlipidemia    Right thalamic infarction (HCC)    Stroke [facility])     Social History   Socioeconomic History   Marital status: Divorced    Spouse name: Not on file   Number of children: Not on file   Years  of education: Not on file   Highest education level: Not on file  Occupational History   Not on file  Tobacco Use   Smoking status: Never   Smokeless tobacco: Never  Vaping Use   Vaping Use: Never used  Substance and Sexual Activity   Alcohol use: Yes    Alcohol/week: 0.0 standard drinks of alcohol    Comment: social   Drug use: Never   Sexual activity: Not on file  Other Topics Concern   Not on file  Social History Narrative   Single.   3 children.   Works as a Engineer, civil (consulting) at [employer]   Enjoys traveling, spending time with friends, exercising, reading.   Social Determinants of Health   Financial Resource Strain: Not on file  Food Insecurity: Not on file  Transportation Needs: Not on file  Physical Activity: Not on file  Stress: Not on file  Social Connections: Not on file  Intimate Partner Violence: Not on file    Past Surgical History:  Procedure Laterality Date   CESAREAN SECTION     GALLBLADDER SURGERY  1997    Family History  Problem Relation Age of Onset   Diabetes Mother    Alcohol abuse Father    Hypertension Father    Kidney disease Father    Stroke Father     Allergies  Allergen Reactions   Bactrim [Sulfamethoxazole-Trimethoprim] Nausea Only    Current Outpatient Medications  on File Prior to Visit  Medication Sig Dispense Refill   amitriptyline (ELAVIL) 50 MG tablet Take 1 tablet (50 mg total) by mouth at bedtime. For nerve pain. 90 tablet 0   blood glucose meter kit and supplies KIT Dispense based on patient and insurance preference. Use up to four times daily as directed. (FOR ICD-9 250.00, 250.01). 1 each 0   cetirizine (ZYRTEC) 10 MG tablet Take 1 tablet (10 mg total) by mouth daily. For allergies 90 tablet 0   Continuous Blood Gluc Receiver (FREESTYLE LIBRE 14 DAY READER) DEVI 1 Device by Does not apply route 3 (three) times daily. 1 each 0   gabapentin (NEURONTIN) 300 MG capsule Take 1 capsule (300 mg total) by mouth at bedtime. For nerve pain. 90  capsule 0   Insulin Pen Needle (BD PEN NEEDLE NANO U/F) 32G X 4 MM MISC USE WITH INSULIN PEN AS DIRECTED 100 each 1   losartan (COZAAR) 25 MG tablet Take 0.5 tablets (12.5 mg total) by mouth daily. For kidney protection 45 tablet 1   metFORMIN (GLUCOPHAGE) 1000 MG tablet Take 1 tablet (1,000 mg total) by mouth 2 (two) times daily. For diabetes. 180 tablet 0   ONETOUCH VERIO test strip USE 4 TIMES A DAY AS DIRECTED 100 strip 5   pantoprazole (PROTONIX) 20 MG tablet Take 1 tablet (20 mg total) by mouth daily. For cough 90 tablet 0   rosuvastatin (CRESTOR) 40 MG tablet Take 1 tablet (40 mg total) by mouth daily. for cholesterol. 90 tablet 0   No current facility-administered medications on file prior to visit.    BP 124/82   Pulse 91   Temp (!) 97 F (36.1 C) (Temporal)   Ht 5' 1.5" (1.562 m)   Wt 168 lb (76.2 kg)   SpO2 97%   BMI 31.23 kg/m  Objective:   Physical Exam Cardiovascular:     Rate and Rhythm: Normal rate and regular rhythm.  Pulmonary:     Effort: Pulmonary effort is normal.     Breath sounds: Normal breath sounds.  Musculoskeletal:     Cervical back: Neck supple.  Skin:    General: Skin is warm and dry.           Assessment & Plan:  Type 2 diabetes mellitus with other circulatory complication, without long-term current use of insulin (HCC) Assessment & Plan: Uncontrolled with A1c of 10.0 today.  Long discussion about the need to eat healthy snacks during the day to prevent hypoglycemia and also to prevent rebound hyperglycemia She will schedule an appointment with a nutritionist.  Commended her on initiating weight watchers. Foot exam today.  Increase Lantus to 35 units daily. Continue glipizide XL 10 mg daily, metformin 1000 mg twice daily. Start Mounjaro 2.5 mg weekly x 4 weeks, then increase to 5 mg weekly thereafter.  She will update if there are any intolerable side effects.  Follow-up in 3 months.  Orders: -     POCT glycosylated hemoglobin (Hb  A1C) -     Tirzepatide; Inject 2.5 mg into the skin once a week. for diabetes.  Dispense: 2 mL; Refill: 0 -     FreeStyle Libre 3 Sensor; PLACE 1 SENSOR ON SKIN EVERY 14 DAYS - CHECKING GLUCOSE  Dispense: 6 each; Refill: 1 -     Lantus SoloStar; Inject 35 Units into the skin daily. for diabetes.  Dispense: 30 mL; Refill: 0 -     glipiZIDE ER; 1 TAB BY MOUTH DAILY WITH BREAKFAST  FOR DIABETES  Dispense: 90 tablet; Refill: 0  History of CVA (cerebrovascular accident) -     Clopidogrel Bisulfate; Take 1 tablet (75 mg total) by mouth daily.  Dispense: 90 tablet; Refill: 0        Doreene Nest, NP

## 2023-03-29 NOTE — Assessment & Plan Note (Signed)
Uncontrolled with A1c of 10.0 today.  Long discussion about the need to eat healthy snacks during the day to prevent hypoglycemia and also to prevent rebound hyperglycemia She will schedule an appointment with a nutritionist.  Commended her on initiating weight watchers. Foot exam today.  Increase Lantus to 35 units daily. Continue glipizide XL 10 mg daily, metformin 1000 mg twice daily. Start Mounjaro 2.5 mg weekly x 4 weeks, then increase to 5 mg weekly thereafter.  She will update if there are any intolerable side effects.  Follow-up in 3 months.

## 2023-03-29 NOTE — Telephone Encounter (Signed)
Can we complete the prior authorization for [facility]?  She tried Trulicity and Ozempic, experienced intolerable side effect.

## 2023-03-30 ENCOUNTER — Other Ambulatory Visit: Payer: Self-pay | Admitting: Primary Care

## 2023-03-30 DIAGNOSIS — E1159 Type 2 diabetes mellitus with other circulatory complications: Secondary | ICD-10-CM

## 2023-03-31 ENCOUNTER — Other Ambulatory Visit: Payer: Self-pay | Admitting: Primary Care

## 2023-03-31 DIAGNOSIS — R053 Chronic cough: Secondary | ICD-10-CM

## 2023-04-03 ENCOUNTER — Telehealth: Payer: Self-pay

## 2023-04-03 ENCOUNTER — Other Ambulatory Visit: Payer: Self-pay | Admitting: Primary Care

## 2023-04-03 ENCOUNTER — Other Ambulatory Visit (HOSPITAL_COMMUNITY): Payer: Self-pay

## 2023-04-03 ENCOUNTER — Encounter: Payer: Self-pay | Admitting: Hematology and Oncology

## 2023-04-03 DIAGNOSIS — Z8673 Personal history of transient ischemic attack (TIA), and cerebral infarction without residual deficits: Secondary | ICD-10-CM

## 2023-04-03 DIAGNOSIS — R053 Chronic cough: Secondary | ICD-10-CM

## 2023-04-03 NOTE — Telephone Encounter (Signed)
Pharmacy Patient Advocate Encounter   Received notification that prior authorization for Mounjaro 2.5MG /0.5ML pen-injectors is required/requested.   PA submitted to [facility] via [facility]eds [facility] or [facility]) confirmation # [id number] Status is pending

## 2023-04-03 NOTE — Telephone Encounter (Signed)
From: Cherlyn Cushing To: Office of Doreene Nest, NP Sent: 04/02/2023 10:34 AM EDT Subject: Medication Renewal Request  Refills have been requested for the following medications:   clopidogrel (PLAVIX) 75 MG tablet [Sherby Moncayo K Shya Kovatch]  Preferred pharmacy: [id number] Judithann Sheen, [place] - [address] Delivery method: Daryll Drown

## 2023-04-04 NOTE — Telephone Encounter (Signed)
PA has been submitted and documented in separate encounter. Please see phone note.

## 2023-04-05 NOTE — Telephone Encounter (Signed)
Patient Advocate Encounter  Prior Authorization for [facility] 2.5MG /0.5ML pen-injectors has been approved with [employer].    PA# [id number] Effective dates: 04/03/23 through 04/02/24

## 2023-04-07 NOTE — Telephone Encounter (Signed)
Looks like the PA for Greggory Keen has been approved. Will contact patient via MyChart.

## 2023-04-18 ENCOUNTER — Other Ambulatory Visit: Payer: Self-pay | Admitting: Primary Care

## 2023-04-18 DIAGNOSIS — R053 Chronic cough: Secondary | ICD-10-CM

## 2023-04-18 NOTE — Telephone Encounter (Signed)
Unable to reach patient. Left voicemail to return call to our office.   

## 2023-04-18 NOTE — Telephone Encounter (Signed)
Please call patient:  Is she still taking pantoprazole 20 mg daily? We prescribed this in May for her ongoing cough.

## 2023-04-19 NOTE — Telephone Encounter (Signed)
Noted. Refill(s) sent to pharmacy.  

## 2023-04-19 NOTE — Telephone Encounter (Signed)
Called and spoke with patient she states she is still taking this daily and she would like a refill as it has helped so much with her cough

## 2023-05-15 ENCOUNTER — Other Ambulatory Visit: Payer: Self-pay | Admitting: Primary Care

## 2023-05-15 DIAGNOSIS — E1159 Type 2 diabetes mellitus with other circulatory complications: Secondary | ICD-10-CM

## 2023-05-15 DIAGNOSIS — M792 Neuralgia and neuritis, unspecified: Secondary | ICD-10-CM

## 2023-05-15 NOTE — Telephone Encounter (Signed)
Patient is due for CPE/follow up in mid October, this will be required prior to any further refills.  Please schedule, thank you!

## 2023-05-16 NOTE — Telephone Encounter (Signed)
 Spoke to pt, scheduled cpe for 07/12/23

## 2023-05-26 ENCOUNTER — Other Ambulatory Visit: Payer: Self-pay | Admitting: Primary Care

## 2023-05-26 DIAGNOSIS — M792 Neuralgia and neuritis, unspecified: Secondary | ICD-10-CM

## 2023-05-29 ENCOUNTER — Other Ambulatory Visit: Payer: Self-pay | Admitting: Primary Care

## 2023-05-29 DIAGNOSIS — R053 Chronic cough: Secondary | ICD-10-CM

## 2023-06-28 ENCOUNTER — Other Ambulatory Visit: Payer: Self-pay | Admitting: Primary Care

## 2023-06-28 DIAGNOSIS — E1159 Type 2 diabetes mellitus with other circulatory complications: Secondary | ICD-10-CM

## 2023-06-28 DIAGNOSIS — E785 Hyperlipidemia, unspecified: Secondary | ICD-10-CM

## 2023-06-28 DIAGNOSIS — R053 Chronic cough: Secondary | ICD-10-CM

## 2023-06-29 MED ORDER — LANTUS SOLOSTAR 100 UNIT/ML ~~LOC~~ SOPN
35.0000 [IU] | PEN_INJECTOR | Freq: Every day | SUBCUTANEOUS | 0 refills | Status: DC
Start: 2023-06-29 — End: 2023-07-12

## 2023-06-29 MED ORDER — PANTOPRAZOLE SODIUM 20 MG PO TBEC
20.0000 mg | DELAYED_RELEASE_TABLET | Freq: Every day | ORAL | 0 refills | Status: DC
Start: 2023-06-29 — End: 2023-07-12

## 2023-06-29 MED ORDER — ROSUVASTATIN CALCIUM 40 MG PO TABS
40.0000 mg | ORAL_TABLET | Freq: Every day | ORAL | 0 refills | Status: DC
Start: 2023-06-29 — End: 2023-07-12

## 2023-07-07 ENCOUNTER — Other Ambulatory Visit: Payer: Self-pay | Admitting: Primary Care

## 2023-07-07 DIAGNOSIS — E1159 Type 2 diabetes mellitus with other circulatory complications: Secondary | ICD-10-CM

## 2023-07-07 DIAGNOSIS — R809 Proteinuria, unspecified: Secondary | ICD-10-CM

## 2023-07-12 ENCOUNTER — Ambulatory Visit: Payer: PRIVATE HEALTH INSURANCE | Admitting: Primary Care

## 2023-07-12 ENCOUNTER — Encounter: Payer: Self-pay | Admitting: Primary Care

## 2023-07-12 VITALS — BP 136/78 | HR 95 | Temp 97.2°F | Ht 61.5 in | Wt 168.0 lb

## 2023-07-12 DIAGNOSIS — Z1231 Encounter for screening mammogram for malignant neoplasm of breast: Secondary | ICD-10-CM

## 2023-07-12 DIAGNOSIS — Z0000 Encounter for general adult medical examination without abnormal findings: Secondary | ICD-10-CM

## 2023-07-12 DIAGNOSIS — F4323 Adjustment disorder with mixed anxiety and depressed mood: Secondary | ICD-10-CM

## 2023-07-12 DIAGNOSIS — E785 Hyperlipidemia, unspecified: Secondary | ICD-10-CM | POA: Diagnosis not present

## 2023-07-12 DIAGNOSIS — E1129 Type 2 diabetes mellitus with other diabetic kidney complication: Secondary | ICD-10-CM

## 2023-07-12 DIAGNOSIS — Z7985 Long-term (current) use of injectable non-insulin antidiabetic drugs: Secondary | ICD-10-CM

## 2023-07-12 DIAGNOSIS — Z23 Encounter for immunization: Secondary | ICD-10-CM | POA: Diagnosis not present

## 2023-07-12 DIAGNOSIS — Z8673 Personal history of transient ischemic attack (TIA), and cerebral infarction without residual deficits: Secondary | ICD-10-CM

## 2023-07-12 DIAGNOSIS — R809 Proteinuria, unspecified: Secondary | ICD-10-CM

## 2023-07-12 DIAGNOSIS — E1159 Type 2 diabetes mellitus with other circulatory complications: Secondary | ICD-10-CM

## 2023-07-12 DIAGNOSIS — M792 Neuralgia and neuritis, unspecified: Secondary | ICD-10-CM

## 2023-07-12 DIAGNOSIS — Z1211 Encounter for screening for malignant neoplasm of colon: Secondary | ICD-10-CM

## 2023-07-12 DIAGNOSIS — Z0001 Encounter for general adult medical examination with abnormal findings: Secondary | ICD-10-CM

## 2023-07-12 DIAGNOSIS — R053 Chronic cough: Secondary | ICD-10-CM | POA: Diagnosis not present

## 2023-07-12 DIAGNOSIS — K219 Gastro-esophageal reflux disease without esophagitis: Secondary | ICD-10-CM

## 2023-07-12 LAB — COMPREHENSIVE METABOLIC PANEL
ALT: 13 U/L (ref 0–35)
AST: 14 U/L (ref 0–37)
Albumin: 4.1 g/dL (ref 3.5–5.2)
Alkaline Phosphatase: 140 U/L — ABNORMAL HIGH (ref 39–117)
BUN: 9 mg/dL (ref 6–23)
CO2: 29 meq/L (ref 19–32)
Calcium: 9.6 mg/dL (ref 8.4–10.5)
Chloride: 101 meq/L (ref 96–112)
Creatinine, Ser: 0.77 mg/dL (ref 0.40–1.20)
GFR: 86.39 mL/min (ref >60.00)
Glucose, Bld: 209 mg/dL — ABNORMAL HIGH (ref 70–99)
Potassium: 3.9 meq/L (ref 3.5–5.1)
Sodium: 138 meq/L (ref 135–145)
Total Bilirubin: 0.3 mg/dL (ref 0.2–1.2)
Total Protein: 7.2 g/dL (ref 6.0–8.3)

## 2023-07-12 LAB — HEMOGLOBIN A1C: Hgb A1c MFr Bld: 10.5 % — ABNORMAL HIGH (ref 4.6–6.5)

## 2023-07-12 LAB — LIPID PANEL
Cholesterol: 128 mg/dL (ref 0–200)
HDL: 38.1 mg/dL — ABNORMAL LOW (ref >39.00)
LDL Cholesterol: 69 mg/dL (ref 0–99)
NonHDL: 89.61
Total CHOL/HDL Ratio: 3
Triglycerides: 103 mg/dL (ref 0.0–149.0)
VLDL: 20.6 mg/dL (ref 0.0–40.0)

## 2023-07-12 MED ORDER — PANTOPRAZOLE SODIUM 20 MG PO TBEC: 20.0000 mg | DELAYED_RELEASE_TABLET | Freq: Every day | ORAL | 0 refills | Status: DC

## 2023-07-12 MED ORDER — GLIPIZIDE ER 10 MG PO TB24
ORAL_TABLET | ORAL | 1 refills | Status: DC
Start: 2023-07-12 — End: 2024-02-18

## 2023-07-12 MED ORDER — ROSUVASTATIN CALCIUM 40 MG PO TABS: 40.0000 mg | ORAL_TABLET | Freq: Every day | ORAL | 3 refills | Status: DC

## 2023-07-12 MED ORDER — AMITRIPTYLINE HCL 50 MG PO TABS: 50.0000 mg | ORAL_TABLET | Freq: Every day | ORAL | 3 refills | Status: DC

## 2023-07-12 MED ORDER — GABAPENTIN 300 MG PO CAPS
300.0000 mg | ORAL_CAPSULE | Freq: Every day | ORAL | 3 refills | Status: DC
Start: 2023-07-12 — End: 2023-08-22

## 2023-07-12 MED ORDER — LOSARTAN POTASSIUM 25 MG PO TABS: 12.5000 mg | ORAL_TABLET | Freq: Every day | ORAL | 0 refills | Status: DC

## 2023-07-12 MED ORDER — METFORMIN HCL 1000 MG PO TABS
1000.0000 mg | ORAL_TABLET | Freq: Two times a day (BID) | ORAL | 1 refills | Status: DC
Start: 2023-07-12 — End: 2024-02-20

## 2023-07-12 MED ORDER — TIRZEPATIDE 2.5 MG/0.5ML ~~LOC~~ SOAJ
2.5000 mg | SUBCUTANEOUS | 0 refills | Status: DC
Start: 2023-07-12 — End: 2023-08-22

## 2023-07-12 MED ORDER — BD PEN NEEDLE NANO 2ND GEN 32G X 4 MM MISC: 3 refills | Status: AC

## 2023-07-12 MED ORDER — CLOPIDOGREL BISULFATE 75 MG PO TABS: 75.0000 mg | ORAL_TABLET | Freq: Every day | ORAL | 3 refills | Status: DC

## 2023-07-12 MED ORDER — LANTUS SOLOSTAR 100 UNIT/ML ~~LOC~~ SOPN: 35.0000 [IU] | PEN_INJECTOR | Freq: Every day | SUBCUTANEOUS | 1 refills | Status: DC

## 2023-07-12 NOTE — Assessment & Plan Note (Signed)
Controlled.  Continue pantoprazole 20 mg daily.

## 2023-07-12 NOTE — Assessment & Plan Note (Signed)
Deteriorated.  Referral placed for therapy.

## 2023-07-12 NOTE — Assessment & Plan Note (Signed)
Immunizations UTD. Influenza vaccine provided today.  Pap smear UTD. Follows with GYN Mammogram due, orders placed. Colonoscopy due, referral placed to GI  Discussed the importance of a healthy diet and regular exercise in order for weight loss, and to reduce the risk of further co-morbidity.  Exam stable. Labs pending.  Follow up in 1 year for repeat physical.

## 2023-07-12 NOTE — Addendum Note (Signed)
Addended by: Vincenza Hews on: 07/12/2023 11:49 AM   Modules accepted: Orders

## 2023-07-12 NOTE — Assessment & Plan Note (Signed)
No new symptoms.  Continue clopidogrel 75 mg daily and rosuvastatin 40 mg daily.

## 2023-07-12 NOTE — Progress Notes (Signed)
Subjective:    Patient ID: Haley Gomez, female    DOB: Sep 22, 1966, 57 y.o.   MRN: [id number]  HPI  Haley Gomez is a very pleasant 57 y.o. female who presents today for complete physical and follow up of chronic conditions.  Chronic cough has returned.  Several days ago she resumed her pantoprazole 20 mg daily which historically helped to resolve her cough.  She has not noticed improvement.  Earlier this year she was referred to ENT for which she never completed as cough resolved.  She would like to be referred now.  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  Last year she was referred to therapy but never connected.  She is interested in the referral now.  Immunizations: -Tetanus: Completed in 2023 -Influenza: Influenza vaccine provided today.  -Shingles: Completed Shingrix series -Pneumonia: Completed last in 2016  Diet: Fair diet.  Exercise: No regular exercise.  Eye exam: Completes annually  Dental exam: Completes semi-annually    Pap Smear: UTD, follows with GYN Mammogram: Due  Colonoscopy: Never completed   Wt Readings from Last 3 Encounters:  07/12/23 168 lb (76.2 kg)  03/29/23 168 lb (76.2 kg)  01/31/23 164 lb (74.4 kg)       Review of Systems  Constitutional:  Negative for unexpected weight change.  HENT:  Negative for rhinorrhea.   Respiratory:  Negative for cough and shortness of breath.   Cardiovascular:  Negative for chest pain.  Gastrointestinal:  Negative for constipation and diarrhea.  Genitourinary:  Negative for difficulty urinating and menstrual problem.  Musculoskeletal:  Negative for arthralgias and myalgias.  Skin:  Negative for rash.  Allergic/Immunologic: Negative for environmental allergies.  Neurological:  Negative for dizziness, numbness and headaches.  Psychiatric/Behavioral:  The patient is nervous/anxious.        Caregiver strain         Past Medical  History:  Diagnosis Date   Diabetes mellitus without complication (HCC)    Hyperlipidemia    Right thalamic infarction (HCC)    Stroke [facility])     Social History   Socioeconomic History   Marital status: Divorced    Spouse name: Not on file   Number of children: Not on file   Years of education: Not on file   Highest education level: Not on file  Occupational History   Not on file  Tobacco Use   Smoking status: Never   Smokeless tobacco: Never  Vaping Use   Vaping status: Never Used  Substance and Sexual Activity   Alcohol use: Yes    Alcohol/week: 0.0 standard drinks of alcohol    Comment: social   Drug use: Never   Sexual activity: Not on file  Other Topics Concern   Not on file  Social History Narrative   Single.   3 children.   Works as a Engineer, civil (consulting) at [employer]   Enjoys traveling, spending time with friends, exercising, reading.   Social Determinants of Health   Financial Resource Strain: Not on file  Food Insecurity: Not on file  Transportation Needs: Not on file  Physical Activity: Not on file  Stress: Not on file  Social Connections: Not on file  Intimate Partner Violence: Not on file    Past Surgical History:  Procedure Laterality Date   CESAREAN SECTION     GALLBLADDER SURGERY  1997    Family History  Problem Relation Age of Onset  Diabetes Mother    Alcohol abuse Father    Hypertension Father    Kidney disease Father    Stroke Father     Allergies  Allergen Reactions   Bactrim [Sulfamethoxazole-Trimethoprim] Nausea Only    Current Outpatient Medications on File Prior to Visit  Medication Sig Dispense Refill   blood glucose meter kit and supplies KIT Dispense based on patient and insurance preference. Use up to four times daily as directed. (FOR ICD-9 250.00, 250.01). 1 each 0   cetirizine (ZYRTEC) 10 MG tablet TAKE 1 TABLET (10 MG TOTAL) BY MOUTH DAILY. FOR ALLERGIES 90 tablet 0   Continuous Blood Gluc Receiver (FREESTYLE LIBRE 14 DAY READER)  DEVI 1 Device by Does not apply route 3 (three) times daily. 1 each 0   Continuous Glucose Sensor (FREESTYLE LIBRE 3 SENSOR) MISC PLACE 1 SENSOR ON SKIN EVERY 14 DAYS - CHECKING GLUCOSE 6 each 1   ONETOUCH VERIO test strip USE 4 TIMES A DAY AS DIRECTED 100 strip 5   No current facility-administered medications on file prior to visit.    BP 136/78   Pulse 95   Temp (!) 97.2 F (36.2 C) (Temporal)   Ht 5' 1.5" (1.562 m)   Wt 168 lb (76.2 kg)   SpO2 98%   BMI 31.23 kg/m  Objective:   Physical Exam HENT:     Right Ear: Tympanic membrane and ear canal normal.     Left Ear: Tympanic membrane and ear canal normal.  Eyes:     Pupils: Pupils are equal, round, and reactive to light.  Cardiovascular:     Rate and Rhythm: Normal rate and regular rhythm.  Pulmonary:     Effort: Pulmonary effort is normal.     Breath sounds: Normal breath sounds.  Abdominal:     General: Bowel sounds are normal.     Palpations: Abdomen is soft.     Tenderness: There is no abdominal tenderness.  Musculoskeletal:        General: Normal range of motion.     Cervical back: Neck supple.  Skin:    General: Skin is warm and dry.  Neurological:     Mental Status: She is alert and oriented to person, place, and time.     Cranial Nerves: No cranial nerve deficit.     Deep Tendon Reflexes:     Reflex Scores:      Patellar reflexes are 2+ on the right side and 2+ on the left side. Psychiatric:        Mood and Affect: Mood normal.           Assessment & Plan:  Encounter for annual general medical examination with abnormal findings in adult Assessment & Plan: Immunizations UTD. Influenza vaccine provided today.  Pap smear UTD. Follows with GYN Mammogram due, orders placed. Colonoscopy due, referral placed to GI  Discussed the importance of a healthy diet and regular exercise in order for weight loss, and to reduce the risk of further co-morbidity.  Exam stable. Labs pending.  Follow up in 1 year  for repeat physical.    Screening mammogram for breast cancer -     3D Screening Mammogram, Left and Right; Future  Screening for colon cancer -     Ambulatory referral to Gastroenterology  Type 2 diabetes mellitus with other circulatory complication, without long-term current use of insulin (HCC) Assessment & Plan: Never started Mounjaro due to lack of stock supply.  Continue Glipizide XL 10 mg daily, Metformin  1000 mg BID. Re-order Mounjaro 2.5 mg daily, will send to her employer pharmacy.  - Never received.  Also out of glipizide since early October. Continue Lantus 35 units daily.  Repeat A1C pending.  Follow up in 3- 6 months based on A1C result.  Orders: -     Tirzepatide; Inject 2.5 mg into the skin once a week. for diabetes.  Dispense: 2 mL; Refill: 0 -     Hemoglobin A1c -     Comprehensive metabolic panel -     Microalbumin / creatinine urine ratio -     BD Pen Needle Nano 2nd Gen; Use once daily with insulin  Dispense: 200 each; Refill: 3 -     glipiZIDE ER; TAKE 1 TABLET BY MOUTH EVERY DAY WITH BREAKFAST FOR DIABETES  Dispense: 90 tablet; Refill: 1 -     Lantus SoloStar; Inject 35 Units into the skin daily. for diabetes.  Dispense: 30 mL; Refill: 1 -     metFORMIN HCl; Take 1 tablet (1,000 mg total) by mouth 2 (two) times daily. For diabetes.  Dispense: 180 tablet; Refill: 1  Persistent cough for 3 weeks or longer Assessment & Plan: Recently returned.  Continue pantoprazole 20 mg daily. Referral placed for ENT.  Orders: -     Ambulatory referral to ENT -     Pantoprazole Sodium; Take 1 tablet (20 mg total) by mouth daily. For cough  Dispense: 90 tablet; Refill: 0  History of CVA (cerebrovascular accident) Assessment & Plan: No new symptoms.  Continue clopidogrel 75 mg daily and rosuvastatin 40 mg daily.  Orders: -     Clopidogrel Bisulfate; Take 1 tablet (75 mg total) by mouth daily.  Dispense: 90 tablet; Refill: 3  Hyperlipidemia, unspecified  hyperlipidemia type Assessment & Plan: Repeat lipid panel pending.  Continue rosuvastatin 40 mg daily, clopidogrel 75 mg daily.  Orders: -     Lipid panel -     Comprehensive metabolic panel -     Rosuvastatin Calcium; Take 1 tablet (40 mg total) by mouth daily. for cholesterol.  Dispense: 90 tablet; Refill: 3  Nerve pain Assessment & Plan: Controlled.  Continue amitriptyline 50 mg at bedtime and gabapentin 300 mg at bedtime.  Orders: -     Amitriptyline HCl; Take 1 tablet (50 mg total) by mouth at bedtime. For nerve pain.  Dispense: 90 tablet; Refill: 3 -     Gabapentin; Take 1 capsule (300 mg total) by mouth at bedtime. For nerve pain.  Dispense: 90 capsule; Refill: 3  Adjustment reaction with anxiety and depression Assessment & Plan: Deteriorated.  Referral placed for therapy.  Orders: -     Ambulatory referral to Psychology  Gastroesophageal reflux disease, unspecified whether esophagitis present Assessment & Plan: Controlled.  Continue pantoprazole 20 mg daily.   Encounter for immunization -     Flu vaccine trivalent PF, 6mos and older(Flulaval,Afluria,Fluarix,Fluzone)  Microalbuminuria due to type 2 diabetes mellitus (HCC) -     Losartan Potassium; Take 0.5 tablets (12.5 mg total) by mouth daily. For kidney protection  Dispense: 45 tablet; Refill: 0        Doreene Nest, NP

## 2023-07-12 NOTE — Assessment & Plan Note (Signed)
Controlled.  Continue amitriptyline 50 mg at bedtime and gabapentin 300 mg at bedtime.

## 2023-07-12 NOTE — Assessment & Plan Note (Signed)
Recently returned.  Continue pantoprazole 20 mg daily. Referral placed for ENT.

## 2023-07-12 NOTE — Assessment & Plan Note (Signed)
Never started [facility] due to lack of stock supply.  Continue Glipizide XL 10 mg daily, Metformin 1000 mg BID. Re-order Mounjaro 2.5 mg daily, will send to her employer pharmacy.  - Never received.  Also out of glipizide since early October. Continue Lantus 35 units daily.  Repeat A1C pending.  Follow up in 3- 6 months based on A1C result.

## 2023-07-12 NOTE — Assessment & Plan Note (Signed)
Repeat lipid panel pending.  Continue rosuvastatin 40 mg daily, clopidogrel 75 mg daily.

## 2023-07-12 NOTE — Patient Instructions (Signed)
Stop by the lab prior to leaving today. I will notify you of your results once received.   You will either be contacted via phone regarding your referral to GI for the colonoscopy, ENT, AND therapy, or you may receive a letter on your MyChart portal from our referral team with instructions for scheduling an appointment. Please let us know if you have not been contacted by anyone within two weeks.  [facility] to schedule your mammogram.   Please schedule a follow up visit for 6 months for a diabetes check.  It was a pleasure to see you today!

## 2023-07-19 ENCOUNTER — Encounter (INDEPENDENT_AMBULATORY_CARE_PROVIDER_SITE_OTHER): Payer: Self-pay | Admitting: Otolaryngology

## 2023-08-17 ENCOUNTER — Institutional Professional Consult (permissible substitution) (INDEPENDENT_AMBULATORY_CARE_PROVIDER_SITE_OTHER): Payer: PRIVATE HEALTH INSURANCE

## 2023-08-22 DIAGNOSIS — M792 Neuralgia and neuritis, unspecified: Secondary | ICD-10-CM

## 2023-08-22 DIAGNOSIS — E1159 Type 2 diabetes mellitus with other circulatory complications: Secondary | ICD-10-CM

## 2023-08-22 DIAGNOSIS — R053 Chronic cough: Secondary | ICD-10-CM

## 2023-08-22 MED ORDER — FREESTYLE LIBRE 3 SENSOR MISC
1 refills | Status: DC
Start: 2023-08-22 — End: 2024-04-12

## 2023-08-22 MED ORDER — TIRZEPATIDE 5 MG/0.5ML ~~LOC~~ SOAJ
5.0000 mg | SUBCUTANEOUS | 0 refills | Status: DC
Start: 2023-08-22 — End: 2023-09-29

## 2023-08-22 MED ORDER — CETIRIZINE HCL 10 MG PO TABS: 10.0000 mg | ORAL_TABLET | Freq: Every day | ORAL | 2 refills | Status: AC

## 2023-08-22 MED ORDER — GABAPENTIN 300 MG PO CAPS
300.0000 mg | ORAL_CAPSULE | Freq: Every day | ORAL | 2 refills | Status: DC
Start: 2023-08-22 — End: 2024-08-17

## 2023-09-23 ENCOUNTER — Other Ambulatory Visit: Payer: Self-pay | Admitting: Primary Care

## 2023-09-23 DIAGNOSIS — R053 Chronic cough: Secondary | ICD-10-CM

## 2023-09-29 ENCOUNTER — Other Ambulatory Visit: Payer: Self-pay | Admitting: Primary Care

## 2023-09-29 DIAGNOSIS — E1159 Type 2 diabetes mellitus with other circulatory complications: Secondary | ICD-10-CM

## 2023-10-17 ENCOUNTER — Other Ambulatory Visit: Payer: Self-pay | Admitting: Primary Care

## 2023-10-17 DIAGNOSIS — R809 Proteinuria, unspecified: Secondary | ICD-10-CM

## 2023-10-17 DIAGNOSIS — R053 Chronic cough: Secondary | ICD-10-CM

## 2023-11-08 ENCOUNTER — Ambulatory Visit: Payer: PRIVATE HEALTH INSURANCE | Admitting: Primary Care

## 2023-11-14 ENCOUNTER — Other Ambulatory Visit: Payer: Self-pay | Admitting: Primary Care

## 2023-11-14 DIAGNOSIS — E1159 Type 2 diabetes mellitus with other circulatory complications: Secondary | ICD-10-CM

## 2023-11-15 ENCOUNTER — Ambulatory Visit: Payer: PRIVATE HEALTH INSURANCE | Admitting: Primary Care

## 2023-11-27 ENCOUNTER — Ambulatory Visit: Payer: PRIVATE HEALTH INSURANCE | Admitting: Primary Care

## 2023-11-27 ENCOUNTER — Encounter: Payer: Self-pay | Admitting: Hematology and Oncology

## 2023-11-28 ENCOUNTER — Encounter: Payer: Self-pay | Admitting: Primary Care

## 2023-11-30 ENCOUNTER — Telehealth: Payer: Self-pay

## 2023-11-30 NOTE — Telephone Encounter (Signed)
 Left message to call office. Need to see if she would like [place] to set up mammogram appointment and if she is seen by gyn.

## 2023-12-27 ENCOUNTER — Other Ambulatory Visit: Payer: Self-pay | Admitting: Primary Care

## 2023-12-27 DIAGNOSIS — E1129 Type 2 diabetes mellitus with other diabetic kidney complication: Secondary | ICD-10-CM

## 2023-12-27 DIAGNOSIS — E1159 Type 2 diabetes mellitus with other circulatory complications: Secondary | ICD-10-CM

## 2024-02-02 ENCOUNTER — Other Ambulatory Visit: Payer: Self-pay | Admitting: Primary Care

## 2024-02-02 DIAGNOSIS — E1159 Type 2 diabetes mellitus with other circulatory complications: Secondary | ICD-10-CM

## 2024-02-16 ENCOUNTER — Other Ambulatory Visit: Payer: Self-pay | Admitting: Primary Care

## 2024-02-16 DIAGNOSIS — E1159 Type 2 diabetes mellitus with other circulatory complications: Secondary | ICD-10-CM

## 2024-02-17 ENCOUNTER — Other Ambulatory Visit: Payer: Self-pay | Admitting: Medical Genetics

## 2024-02-20 ENCOUNTER — Ambulatory Visit: Payer: Self-pay | Admitting: Primary Care

## 2024-02-20 ENCOUNTER — Encounter: Payer: Self-pay | Admitting: Hematology and Oncology

## 2024-02-20 ENCOUNTER — Ambulatory Visit (INDEPENDENT_AMBULATORY_CARE_PROVIDER_SITE_OTHER): Payer: PRIVATE HEALTH INSURANCE | Admitting: Primary Care

## 2024-02-20 ENCOUNTER — Encounter: Payer: Self-pay | Admitting: Primary Care

## 2024-02-20 VITALS — BP 110/62 | HR 93 | Temp 97.1°F | Ht 61.5 in | Wt 164.0 lb

## 2024-02-20 DIAGNOSIS — E1159 Type 2 diabetes mellitus with other circulatory complications: Secondary | ICD-10-CM

## 2024-02-20 DIAGNOSIS — Z7985 Long-term (current) use of injectable non-insulin antidiabetic drugs: Secondary | ICD-10-CM | POA: Diagnosis not present

## 2024-02-20 DIAGNOSIS — Z7984 Long term (current) use of oral hypoglycemic drugs: Secondary | ICD-10-CM

## 2024-02-20 LAB — MICROALBUMIN / CREATININE URINE RATIO
Creatinine,U: 102.7 mg/dL
Microalb Creat Ratio: 169.5 mg/g — ABNORMAL HIGH (ref 0.0–30.0)
Microalb, Ur: 17.4 mg/dL — ABNORMAL HIGH (ref 0.0–1.9)

## 2024-02-20 LAB — POCT GLYCOSYLATED HEMOGLOBIN (HGB A1C): Hemoglobin A1C: 9.6 % — AB (ref 4.0–5.6)

## 2024-02-20 MED ORDER — TIRZEPATIDE 2.5 MG/0.5ML ~~LOC~~ SOAJ: 2.5000 mg | SUBCUTANEOUS | 0 refills | Status: DC

## 2024-02-20 MED ORDER — GLIPIZIDE ER 10 MG PO TB24: ORAL_TABLET | ORAL | 0 refills | Status: DC

## 2024-02-20 MED ORDER — METFORMIN HCL 1000 MG PO TABS: 1000.0000 mg | ORAL_TABLET | Freq: Two times a day (BID) | ORAL | 0 refills | Status: DC

## 2024-02-20 NOTE — Patient Instructions (Signed)
 Start tirzepitide (Mounjaro ) for diabetes/weight loss. Start by injecting 2.5 mg into the skin once weekly for 4 weeks, then increase to 5 mg once weekly thereafter. Please notify me once you've used your last 2.5 mg pen so that I can prescribe the next dose.   Resume your glipizide  and metformin .  Please schedule a follow up visit for 3 months.  It was a pleasure to see you today!

## 2024-02-20 NOTE — Assessment & Plan Note (Signed)
 Slight improvement but still above goal with A1c of 9.6 today.  Discussed that she should never medications and to notify me if she is having difficulties with the pharmacy and refills.  Resume Mounjaro  at 2.5 mg weekly x 4 weeks, then increase to 5 mg weekly thereafter. Resume glipizide  XL 10 mg daily, metformin  1000 mg twice daily.  If she begins to notice hypoglycemic episodes then we will reduce her glipizide  XL to 5 mg daily.  She will update. Follow-up in 3 months.

## 2024-02-20 NOTE — Progress Notes (Signed)
 Subjective:    Patient ID: Haley Gomez, female    DOB: May 27, 1966, 58 y.o.   MRN: [id number]  HPI  Haley Gomez is a very pleasant 59 y.o. female with a history of type 2 diabetes, CVA, hyperlipidemia, chronic nerve pain who presents today for follow-up of diabetes.  Current medications include: Glipizide  XL 10 mg daily, metformin  1000 mg twice daily, Mounjaro  5 mg weekly. She ran out of Glipizide  3 weeks ago, metformin  1000 mg 1 week ago and Mounjaro  3 months ago.   She is checking her blood glucose continuously and is getting readings of:  She was experiencing hypoglycemia most nights during the week, 50s-60s while taking Mounjaro  with metformin  and glipizide . She's not noticed lower readings in a while.   AM fasting: low 100s 2 hours after lunch: low to mid 100s  Last A1C: 10.5 in October 2024 Last Eye Exam: Due Last Foot Exam: Up-to-date Pneumonia Vaccination: Completed last in 2016 Urine Microalbumin: Due Statin: Rosuvastatin   Dietary changes since last visit: Increased vegetable intake, trying to eat more protein, reducing potatoes, snacking on veggies.    Exercise: None, recently signed up for piliates class       Review of Systems  Respiratory:  Negative for shortness of breath.   Cardiovascular:  Negative for chest pain.  Neurological:  Negative for numbness.         Past Medical History:  Diagnosis Date   Diabetes mellitus without complication (HCC)    Hyperlipidemia    Right thalamic infarction (HCC)    Stroke [facility])     Social History   Socioeconomic History   Marital status: Divorced    Spouse name: Not on file   Number of children: Not on file   Years of education: Not on file   Highest education level: Bachelor's degree (e.g., BA, AB, BS)  Occupational History   Not on file  Tobacco Use   Smoking status: Never   Smokeless tobacco: Never  Vaping Use   Vaping status: Never Used  Substance and Sexual Activity   Alcohol use: Yes     Alcohol/week: 0.0 standard drinks of alcohol    Comment: social   Drug use: Never   Sexual activity: Not on file  Other Topics Concern   Not on file  Social History Narrative   Single.   3 children.   Works as a Engineer, civil (consulting) at [employer]   Enjoys traveling, spending time with friends, exercising, reading.   Social Drivers of Corporate investment banker Strain: Low Risk  (02/17/2024)   Overall Financial Resource Strain (CARDIA)    Difficulty of Paying Living Expenses: Not hard at all  Food Insecurity: No Food Insecurity (02/17/2024)   Hunger Vital Sign    Worried About Running Out of Food in the Last Year: Never true    Ran Out of Food in the Last Year: Never true  Transportation Needs: No Transportation Needs (02/17/2024)   PRAPARE - Administrator, Civil Service (Medical): No    Lack of Transportation (Non-Medical): No  Physical Activity: Unknown (02/17/2024)   Exercise Vital Sign    Days of Exercise per Week: 0 days    Minutes of Exercise per Session: Not on file  Stress: No Stress Concern Present (02/17/2024)   [employer] of Occupational Health - Occupational Stress Questionnaire    Feeling of Stress : Not at all  Social Connections: Unknown (02/17/2024)   Social Connection and Isolation Panel [NHANES]  Frequency of Communication with Friends and Family: More than three times a week    Frequency of Social Gatherings with Friends and Family: More than three times a week    Attends Religious Services: Patient declined    Database administrator or Organizations: No    Attends Engineer, structural: Not on file    Marital Status: Divorced  Catering manager Violence: Not on file    Past Surgical History:  Procedure Laterality Date   CESAREAN SECTION     GALLBLADDER SURGERY  1997    Family History  Problem Relation Age of Onset   Diabetes Mother    Alcohol abuse Father    Hypertension Father    Kidney disease Father    Stroke Father     Allergies   Allergen Reactions   Bactrim [Sulfamethoxazole-Trimethoprim] Nausea Only    Current Outpatient Medications on File Prior to Visit  Medication Sig Dispense Refill   amitriptyline  (ELAVIL ) 50 MG tablet Take 1 tablet (50 mg total) by mouth at bedtime. For nerve pain. 90 tablet 3   blood glucose meter kit and supplies KIT Dispense based on patient and insurance preference. Use up to four times daily as directed. (FOR ICD-9 250.00, 250.01). 1 each 0   cetirizine  (ZYRTEC ) 10 MG tablet Take 1 tablet (10 mg total) by mouth daily. For allergies 90 tablet 2   clopidogrel  (PLAVIX ) 75 MG tablet Take 1 tablet (75 mg total) by mouth daily. 90 tablet 3   Continuous Blood Gluc Receiver (FREESTYLE LIBRE 14 DAY READER) DEVI 1 Device by Does not apply route 3 (three) times daily. 1 each 0   Continuous Glucose Sensor (FREESTYLE LIBRE 3 SENSOR) MISC PLACE 1 SENSOR ON SKIN EVERY 14 DAYS - CHECKING GLUCOSE 6 each 1   gabapentin  (NEURONTIN ) 300 MG capsule Take 1 capsule (300 mg total) by mouth at bedtime. For nerve pain. 90 capsule 2   Insulin  Pen Needle (BD PEN NEEDLE NANO 2ND GEN) 32G X 4 MM MISC Use once daily with insulin  200 each 3   LANTUS  SOLOSTAR 100 UNIT/ML Solostar Pen Inject 35 Units under the skin daily. 15 mL 0   losartan  (COZAAR ) 25 MG tablet Take 0.5 tablets (12.5 mg total) by mouth daily. For kidney protection 45 tablet 1   ONETOUCH VERIO test strip USE 4 TIMES A DAY AS DIRECTED 100 strip 5   pantoprazole  (PROTONIX ) 20 MG tablet Take 1 tablet (20 mg total) by mouth daily. For cough 90 tablet 1   rosuvastatin  (CRESTOR ) 40 MG tablet Take 1 tablet (40 mg total) by mouth daily. for cholesterol. 90 tablet 3   No current facility-administered medications on file prior to visit.    BP 110/62   Pulse 93   Temp (!) 97.1 F (36.2 C) (Temporal)   Ht 5' 1.5" (1.562 m)   Wt 164 lb (74.4 kg)   SpO2 98%   BMI 30.49 kg/m  Objective:   Physical Exam Cardiovascular:     Rate and Rhythm: Normal rate and  regular rhythm.  Pulmonary:     Effort: Pulmonary effort is normal.     Breath sounds: Normal breath sounds.  Musculoskeletal:     Cervical back: Neck supple.  Skin:    General: Skin is warm and dry.  Neurological:     Mental Status: She is alert and oriented to person, place, and time.  Psychiatric:        Mood and Affect: Mood normal.  Assessment & Plan:  Type 2 diabetes mellitus with other circulatory complication, without long-term current use of insulin  (HCC) Assessment & Plan: Slight improvement but still above goal with A1c of 9.6 today.  Discussed that she should never medications and to notify me if she is having difficulties with the pharmacy and refills.  Resume Mounjaro  at 2.5 mg weekly x 4 weeks, then increase to 5 mg weekly thereafter. Resume glipizide  XL 10 mg daily, metformin  1000 mg twice daily.  If she begins to notice hypoglycemic episodes then we will reduce her glipizide  XL to 5 mg daily.  She will update. Follow-up in 3 months.   Orders: -     POCT glycosylated hemoglobin (Hb A1C) -     Tirzepatide ; Inject 2.5 mg into the skin once a week. for diabetes.  Dispense: 2 mL; Refill: 0 -     glipiZIDE  ER; TAKE 1 TABLET BY MOUTH EVERY DAY WITH BREAKFAST FOR DIABETES  Dispense: 30 tablet; Refill: 0 -     metFORMIN  HCl; Take 1 tablet (1,000 mg total) by mouth 2 (two) times daily. For diabetes.  Dispense: 60 tablet; Refill: 0 -     Microalbumin / creatinine urine ratio        Gabriel John, NP

## 2024-02-21 MED ORDER — EMPAGLIFLOZIN 10 MG PO TABS: 10.0000 mg | ORAL_TABLET | Freq: Every day | ORAL | 1 refills | Status: DC

## 2024-03-05 ENCOUNTER — Telehealth: Payer: Self-pay

## 2024-03-05 ENCOUNTER — Other Ambulatory Visit (HOSPITAL_COMMUNITY): Payer: Self-pay

## 2024-03-05 NOTE — Telephone Encounter (Signed)
 Pharmacy Patient Advocate Encounter   Received notification from Patient Pharmacy that prior authorization for Mounjaro  2.5 is required/requested.   Insurance verification completed.   The patient is insured through [facility] .   Per test claim: Refill too soon. PA is not needed at this time. Medication was filled 02/20/24. Next eligible fill date is 03/12/24.

## 2024-03-13 ENCOUNTER — Other Ambulatory Visit: Payer: Self-pay | Admitting: Primary Care

## 2024-03-13 DIAGNOSIS — E1159 Type 2 diabetes mellitus with other circulatory complications: Secondary | ICD-10-CM

## 2024-03-21 ENCOUNTER — Other Ambulatory Visit: Payer: Self-pay | Admitting: Primary Care

## 2024-03-21 DIAGNOSIS — E1159 Type 2 diabetes mellitus with other circulatory complications: Secondary | ICD-10-CM

## 2024-04-11 ENCOUNTER — Other Ambulatory Visit: Payer: Self-pay | Admitting: Primary Care

## 2024-04-11 DIAGNOSIS — E1159 Type 2 diabetes mellitus with other circulatory complications: Secondary | ICD-10-CM

## 2024-04-12 ENCOUNTER — Other Ambulatory Visit: Payer: Self-pay | Admitting: Primary Care

## 2024-04-12 DIAGNOSIS — E1159 Type 2 diabetes mellitus with other circulatory complications: Secondary | ICD-10-CM

## 2024-04-12 NOTE — Telephone Encounter (Signed)
Lvm to schedule CPE

## 2024-04-12 NOTE — Telephone Encounter (Signed)
 Patient is due for CPE and follow up in early September, this will be required prior to any further refills.  Please schedule, thank you!

## 2024-04-15 NOTE — Telephone Encounter (Signed)
 LVM to schedule CPE early September in order to receive refills

## 2024-04-16 ENCOUNTER — Encounter: Payer: Self-pay | Admitting: Primary Care

## 2024-04-16 NOTE — Telephone Encounter (Signed)
Sent letter to patient asking to call and schedule.

## 2024-05-22 ENCOUNTER — Ambulatory Visit: Admitting: Primary Care

## 2024-06-20 ENCOUNTER — Other Ambulatory Visit: Payer: Self-pay | Admitting: Primary Care

## 2024-06-20 DIAGNOSIS — E1159 Type 2 diabetes mellitus with other circulatory complications: Secondary | ICD-10-CM

## 2024-07-12 ENCOUNTER — Other Ambulatory Visit: Payer: Self-pay | Admitting: Primary Care

## 2024-07-12 DIAGNOSIS — E1159 Type 2 diabetes mellitus with other circulatory complications: Secondary | ICD-10-CM

## 2024-07-16 ENCOUNTER — Encounter: Payer: Self-pay | Admitting: Primary Care

## 2024-07-16 ENCOUNTER — Ambulatory Visit: Admitting: Primary Care

## 2024-07-16 VITALS — BP 106/68 | HR 85 | Temp 97.2°F | Ht 61.5 in | Wt 161.0 lb

## 2024-07-16 DIAGNOSIS — E785 Hyperlipidemia, unspecified: Secondary | ICD-10-CM | POA: Diagnosis not present

## 2024-07-16 DIAGNOSIS — K219 Gastro-esophageal reflux disease without esophagitis: Secondary | ICD-10-CM | POA: Diagnosis not present

## 2024-07-16 DIAGNOSIS — M792 Neuralgia and neuritis, unspecified: Secondary | ICD-10-CM | POA: Diagnosis not present

## 2024-07-16 DIAGNOSIS — Z0000 Encounter for general adult medical examination without abnormal findings: Secondary | ICD-10-CM | POA: Diagnosis not present

## 2024-07-16 DIAGNOSIS — Z7984 Long term (current) use of oral hypoglycemic drugs: Secondary | ICD-10-CM

## 2024-07-16 DIAGNOSIS — Z8673 Personal history of transient ischemic attack (TIA), and cerebral infarction without residual deficits: Secondary | ICD-10-CM

## 2024-07-16 DIAGNOSIS — E1159 Type 2 diabetes mellitus with other circulatory complications: Secondary | ICD-10-CM | POA: Diagnosis not present

## 2024-07-16 DIAGNOSIS — Z1211 Encounter for screening for malignant neoplasm of colon: Secondary | ICD-10-CM

## 2024-07-16 DIAGNOSIS — Z1231 Encounter for screening mammogram for malignant neoplasm of breast: Secondary | ICD-10-CM

## 2024-07-16 LAB — COMPREHENSIVE METABOLIC PANEL WITH GFR
ALT: 10 U/L (ref 0–35)
AST: 11 U/L (ref 0–37)
Albumin: 4.2 g/dL (ref 3.5–5.2)
Alkaline Phosphatase: 113 U/L (ref 39–117)
BUN: 15 mg/dL (ref 6–23)
CO2: 28 meq/L (ref 19–32)
Calcium: 9.4 mg/dL (ref 8.4–10.5)
Chloride: 103 meq/L (ref 96–112)
Creatinine, Ser: 0.83 mg/dL (ref 0.40–1.20)
GFR: 78.39 mL/min (ref >60.00)
Glucose, Bld: 248 mg/dL — ABNORMAL HIGH (ref 70–99)
Potassium: 4.4 meq/L (ref 3.5–5.1)
Sodium: 140 meq/L (ref 135–145)
Total Bilirubin: 0.3 mg/dL (ref 0.2–1.2)
Total Protein: 7.2 g/dL (ref 6.0–8.3)

## 2024-07-16 LAB — LIPID PANEL
Cholesterol: 119 mg/dL (ref 0–200)
HDL: 34.8 mg/dL — ABNORMAL LOW (ref >39.00)
LDL Cholesterol: 67 mg/dL (ref 0–99)
NonHDL: 84.25
Total CHOL/HDL Ratio: 3
Triglycerides: 88 mg/dL (ref 0.0–149.0)
VLDL: 17.6 mg/dL (ref 0.0–40.0)

## 2024-07-16 LAB — HEMOGLOBIN A1C: Hgb A1c MFr Bld: 9.1 % — ABNORMAL HIGH (ref 4.6–6.5)

## 2024-07-16 NOTE — Assessment & Plan Note (Signed)
 No new symptoms.  Continue rosuvastatin  40 mg daily, clopidogrel  75 mg daily.

## 2024-07-16 NOTE — Assessment & Plan Note (Signed)
 Controlled.  Continue gabapentin  300 mg HS and amitriptyline  50 mg HS.

## 2024-07-16 NOTE — Assessment & Plan Note (Signed)
 Continue.  Pantoprazole  20 mg daily.

## 2024-07-16 NOTE — Patient Instructions (Signed)
 Stop by the lab prior to leaving today. I will notify you of your results once received.   [facility] to schedule your mammogram.   You will receive a phone call regarding the colonoscopy  Please schedule a follow up visit for 6 months for a diabetes check.  It was a pleasure to see you today!

## 2024-07-16 NOTE — Assessment & Plan Note (Addendum)
 Repeat A1C pending.  Continue metformin  1000 mg BID, Glipizide  XL 10 mg daily, Lantus  35 units daily, Jardiance  10 mg daily. Continue losartan  12.5 mg daily for renal protection  Follow up in 3-6 months based on A1C results.

## 2024-07-16 NOTE — Progress Notes (Signed)
 Subjective:    Patient ID: Haley Gomez, female    DOB: March 07, 1966, 58 y.o.   MRN: [id number]  Haley Gomez is a very pleasant 58 y.o. female who presents today for complete physical and follow up of chronic conditions.  Immunizations: -Tetanus: Completed in 2023 -Influenza: Completed this season  -Shingles: Completed Shingrix series -Pneumonia: Completed 2016   Diet: Fair diet.  Exercise: No regular exercise.  Eye exam: Completes annually  Dental exam: Completes semi-annually    Pap Smear: Completed >5 years ago  Mammogram: Completed in 2016   Colonoscopy: Never completed          Review of Systems  Constitutional:  Negative for unexpected weight change.  HENT:  Negative for rhinorrhea.   Respiratory:  Negative for cough and shortness of breath.   Cardiovascular:  Negative for chest pain.  Gastrointestinal:  Negative for constipation and diarrhea.  Genitourinary:  Negative for difficulty urinating.  Musculoskeletal:  Negative for arthralgias and myalgias.  Skin:  Negative for rash.  Allergic/Immunologic: Negative for environmental allergies.  Neurological:  Negative for dizziness and headaches.  Psychiatric/Behavioral:  The patient is nervous/anxious.          Past Medical History:  Diagnosis Date   Diabetes mellitus without complication (HCC)    Hyperlipidemia    Persistent cough for 3 weeks or longer 01/31/2023   Right thalamic infarction [facility])    Stroke [facility])     Social History   Socioeconomic History   Marital status: Divorced    Spouse name: Not on file   Number of children: Not on file   Years of education: Not on file   Highest education level: Bachelor's degree (e.g., BA, AB, BS)  Occupational History   Not on file  Tobacco Use   Smoking status: Never   Smokeless tobacco: Never  Vaping Use   Vaping status: Never Used  Substance and Sexual Activity   Alcohol use: Yes    Alcohol/week: 0.0 standard drinks of alcohol     Comment: social   Drug use: Never   Sexual activity: Not on file  Other Topics Concern   Not on file  Social History Narrative   Single.   3 children.   Works as a Engineer, civil (consulting) at [employer]   Enjoys traveling, spending time with friends, exercising, reading.   Social Drivers of Corporate investment banker Strain: Low Risk  (02/17/2024)   Overall Financial Resource Strain (CARDIA)    Difficulty of Paying Living Expenses: Not hard at all  Food Insecurity: No Food Insecurity (02/17/2024)   Hunger Vital Sign    Worried About Running Out of Food in the Last Year: Never true    Ran Out of Food in the Last Year: Never true  Transportation Needs: No Transportation Needs (02/17/2024)   PRAPARE - Administrator, Civil Service (Medical): No    Lack of Transportation (Non-Medical): No  Physical Activity: Unknown (02/17/2024)   Exercise Vital Sign    Days of Exercise per Week: 0 days    Minutes of Exercise per Session: Not on file  Stress: No Stress Concern Present (02/17/2024)   [employer] of Occupational Health - Occupational Stress Questionnaire    Feeling of Stress : Not at all  Social Connections: Unknown (02/17/2024)   Social Connection and Isolation Panel    Frequency of Communication with Friends and Family: More than three times a week    Frequency of Social Gatherings with Friends and Family:  More than three times a week    Attends Religious Services: Patient declined    Active Member of Clubs or Organizations: No    Attends Engineer, structural: Not on file    Marital Status: Divorced  Catering manager Violence: Not on file    Past Surgical History:  Procedure Laterality Date   CESAREAN SECTION     GALLBLADDER SURGERY  1997    Family History  Problem Relation Age of Onset   Diabetes Mother    Alcohol abuse Father    Hypertension Father    Kidney disease Father    Stroke Father     Allergies  Allergen Reactions   Bactrim [Sulfamethoxazole-Trimethoprim]  Nausea Only    Current Outpatient Medications on File Prior to Visit  Medication Sig Dispense Refill   amitriptyline  (ELAVIL ) 50 MG tablet Take 1 tablet (50 mg total) by mouth at bedtime. For nerve pain. 90 tablet 3   blood glucose meter kit and supplies KIT Dispense based on patient and insurance preference. Use up to four times daily as directed. (FOR ICD-9 250.00, 250.01). 1 each 0   cetirizine  (ZYRTEC ) 10 MG tablet Take 1 tablet (10 mg total) by mouth daily. For allergies 90 tablet 2   clopidogrel  (PLAVIX ) 75 MG tablet Take 1 tablet (75 mg total) by mouth daily. 90 tablet 3   Continuous Blood Gluc Receiver (FREESTYLE LIBRE 14 DAY READER) DEVI 1 Device by Does not apply route 3 (three) times daily. 1 each 0   Continuous Glucose Sensor (FREESTYLE LIBRE 3 SENSOR) MISC PLACE 1 SENSOR ON SKIN EVERY 14 DAYS - CHECKING GLUCOSE 6 each 1   empagliflozin  (JARDIANCE ) 10 MG TABS tablet Take 1 tablet (10 mg total) by mouth daily before breakfast. for diabetes. 90 tablet 1   gabapentin  (NEURONTIN ) 300 MG capsule Take 1 capsule (300 mg total) by mouth at bedtime. For nerve pain. 90 capsule 2   glipiZIDE  (GLUCOTROL  XL) 10 MG 24 hr tablet TAKE 1 TABLET BY MOUTH EVERY DAY WITH BREAKFAST FOR DIABETES 90 tablet 0   Insulin  Pen Needle (BD PEN NEEDLE NANO 2ND GEN) 32G X 4 MM MISC Use once daily with insulin  200 each 3   LANTUS  SOLOSTAR 100 UNIT/ML Solostar Pen Inject 35 Units under the skin daily. **DISCARD PEN 28 DAYS AFTER FIRST USE** 15 mL 0   losartan  (COZAAR ) 25 MG tablet Take 0.5 tablets (12.5 mg total) by mouth daily. For kidney protection 45 tablet 1   metFORMIN  (GLUCOPHAGE ) 1000 MG tablet Take 1 tablet (1,000 mg total) by mouth 2 (two) times daily with a meal. for diabetes. 180 tablet 0   ONETOUCH VERIO test strip USE 4 TIMES A DAY AS DIRECTED 100 strip 5   pantoprazole  (PROTONIX ) 20 MG tablet Take 1 tablet (20 mg total) by mouth daily. For cough 90 tablet 1   rosuvastatin  (CRESTOR ) 40 MG tablet Take 1  tablet (40 mg total) by mouth daily. for cholesterol. 90 tablet 3   No current facility-administered medications on file prior to visit.    BP 106/68   Pulse 85   Temp (!) 97.2 F (36.2 C) (Temporal)   Ht 5' 1.5 (1.562 m)   Wt 161 lb (73 kg)   SpO2 97%   BMI 29.93 kg/m  Objective:   Physical Exam HENT:     Right Ear: Tympanic membrane and ear canal normal.     Left Ear: Tympanic membrane and ear canal normal.  Eyes:     Pupils:  Pupils are equal, round, and reactive to light.  Cardiovascular:     Rate and Rhythm: Normal rate and regular rhythm.  Pulmonary:     Effort: Pulmonary effort is normal.     Breath sounds: Normal breath sounds.  Abdominal:     General: Bowel sounds are normal.     Palpations: Abdomen is soft.     Tenderness: There is no abdominal tenderness.  Musculoskeletal:        General: Normal range of motion.     Cervical back: Neck supple.  Skin:    General: Skin is warm and dry.  Neurological:     Mental Status: She is alert and oriented to person, place, and time.     Cranial Nerves: No cranial nerve deficit.     Deep Tendon Reflexes:     Reflex Scores:      Patellar reflexes are 2+ on the right side and 2+ on the left side. Psychiatric:        Mood and Affect: Mood normal.     Physical Exam        Assessment & Plan:  Preventative health care Assessment & Plan: Immunizations UTD. Pap smear overdue, offered to complete today for which she kindly declines.  She will schedule in the future. Mammogram overdue, orders placed. Colonoscopy due, referral placed to GI  Discussed the importance of a healthy diet and regular exercise in order for weight loss, and to reduce the risk of further co-morbidity.  Exam stable. Labs pending.  Follow up in 1 year for repeat physical.    Gastroesophageal reflux disease, unspecified whether esophagitis present Assessment & Plan: Continue.  Pantoprazole  20 mg daily.   Type 2 diabetes mellitus with  other circulatory complication, without long-term current use of insulin  (HCC) Assessment & Plan: Repeat A1C pending.  Continue metformin  1000 mg BID, Glipizide  XL 10 mg daily, Lantus  35 units daily, Jardiance  10 mg daily. Continue losartan  12.5 mg daily for renal protection  Follow up in 3-6 months based on A1C results.   Orders: -     Hemoglobin A1c -     Comprehensive metabolic panel with GFR  Nerve pain Assessment & Plan: Controlled.  Continue gabapentin  300 mg HS and amitriptyline  50 mg HS.    Hyperlipidemia, unspecified hyperlipidemia type Assessment & Plan: Repeat lipid panel pending. Continue rosuvastatin  40 mg daily  Orders: -     Lipid panel  History of CVA (cerebrovascular accident) Assessment & Plan: No new symptoms.  Continue rosuvastatin  40 mg daily, clopidogrel  75 mg daily.   Screening mammogram for breast cancer -     3D Screening Mammogram, Left and Right; Future  Screening for colon cancer -     Ambulatory referral to Gastroenterology    Assessment and Plan Assessment & Plan         Comer MARLA Gaskins, NP     History of Present Illness

## 2024-07-16 NOTE — Assessment & Plan Note (Signed)
 Repeat lipid panel pending.  Continue rosuvastatin 40 mg daily.

## 2024-07-16 NOTE — Assessment & Plan Note (Signed)
 Immunizations UTD. Pap smear overdue, offered to complete today for which she kindly declines.  She will schedule in the future. Mammogram overdue, orders placed. Colonoscopy due, referral placed to GI  Discussed the importance of a healthy diet and regular exercise in order for weight loss, and to reduce the risk of further co-morbidity.  Exam stable. Labs pending.  Follow up in 1 year for repeat physical.

## 2024-07-17 ENCOUNTER — Ambulatory Visit: Payer: Self-pay | Admitting: Primary Care

## 2024-07-22 ENCOUNTER — Other Ambulatory Visit: Payer: Self-pay | Admitting: Medical Genetics

## 2024-07-22 DIAGNOSIS — Z006 Encounter for examination for normal comparison and control in clinical research program: Secondary | ICD-10-CM

## 2024-08-15 ENCOUNTER — Other Ambulatory Visit: Payer: Self-pay | Admitting: Primary Care

## 2024-08-15 DIAGNOSIS — R053 Chronic cough: Secondary | ICD-10-CM

## 2024-08-15 DIAGNOSIS — E1159 Type 2 diabetes mellitus with other circulatory complications: Secondary | ICD-10-CM

## 2024-08-15 DIAGNOSIS — E1129 Type 2 diabetes mellitus with other diabetic kidney complication: Secondary | ICD-10-CM

## 2024-08-15 DIAGNOSIS — M792 Neuralgia and neuritis, unspecified: Secondary | ICD-10-CM

## 2024-09-26 ENCOUNTER — Other Ambulatory Visit: Payer: Self-pay | Admitting: Primary Care

## 2024-09-26 DIAGNOSIS — E785 Hyperlipidemia, unspecified: Secondary | ICD-10-CM

## 2024-09-26 DIAGNOSIS — E1159 Type 2 diabetes mellitus with other circulatory complications: Secondary | ICD-10-CM

## 2024-09-26 DIAGNOSIS — Z8673 Personal history of transient ischemic attack (TIA), and cerebral infarction without residual deficits: Secondary | ICD-10-CM

## 2024-09-27 NOTE — Telephone Encounter (Signed)
 Patient is due for diabetes follow up in late January 2026. Please schedule, thank you!

## 2024-09-27 NOTE — Telephone Encounter (Signed)
 I spoked with patient and she stated she would give a call back.
